# Patient Record
Sex: Female | Born: 1935 | Race: White | Hispanic: No | Smoking: Never smoker
Health system: Southern US, Community
[De-identification: ages and names within clinical notes are randomized; demographics above are authoritative.]

## PROBLEM LIST (undated history)

## (undated) DIAGNOSIS — I10 Essential (primary) hypertension: Secondary | ICD-10-CM

## (undated) DIAGNOSIS — N390 Urinary tract infection, site not specified: Secondary | ICD-10-CM

## (undated) DIAGNOSIS — F32A Depression, unspecified: Secondary | ICD-10-CM

## (undated) DIAGNOSIS — F329 Major depressive disorder, single episode, unspecified: Secondary | ICD-10-CM

## (undated) DIAGNOSIS — F0390 Unspecified dementia without behavioral disturbance: Secondary | ICD-10-CM

## (undated) DIAGNOSIS — I2510 Atherosclerotic heart disease of native coronary artery without angina pectoris: Secondary | ICD-10-CM

## (undated) DIAGNOSIS — F419 Anxiety disorder, unspecified: Secondary | ICD-10-CM

## (undated) DIAGNOSIS — K529 Noninfective gastroenteritis and colitis, unspecified: Secondary | ICD-10-CM

## (undated) DIAGNOSIS — I480 Paroxysmal atrial fibrillation: Secondary | ICD-10-CM

## (undated) DIAGNOSIS — K219 Gastro-esophageal reflux disease without esophagitis: Secondary | ICD-10-CM

## (undated) DIAGNOSIS — J411 Mucopurulent chronic bronchitis: Secondary | ICD-10-CM

## (undated) DIAGNOSIS — S72002A Fracture of unspecified part of neck of left femur, initial encounter for closed fracture: Secondary | ICD-10-CM

## (undated) DIAGNOSIS — J849 Interstitial pulmonary disease, unspecified: Secondary | ICD-10-CM

## (undated) DIAGNOSIS — C50919 Malignant neoplasm of unspecified site of unspecified female breast: Secondary | ICD-10-CM

## (undated) HISTORY — DX: Paroxysmal atrial fibrillation: I48.0

## (undated) HISTORY — DX: Malignant neoplasm of unspecified site of unspecified female breast: C50.919

## (undated) HISTORY — DX: Unspecified dementia, unspecified severity, without behavioral disturbance, psychotic disturbance, mood disturbance, and anxiety: F03.90

## (undated) HISTORY — DX: Fracture of unspecified part of neck of left femur, initial encounter for closed fracture: S72.002A

## (undated) HISTORY — DX: Gastro-esophageal reflux disease without esophagitis: K21.9

## (undated) HISTORY — DX: Atherosclerotic heart disease of native coronary artery without angina pectoris: I25.10

## (undated) HISTORY — PX: CATARACT EXTRACTION: SUR2

---

## 2006-11-14 DIAGNOSIS — C50919 Malignant neoplasm of unspecified site of unspecified female breast: Secondary | ICD-10-CM

## 2006-11-14 HISTORY — PX: MASTECTOMY: SHX3

## 2006-11-14 HISTORY — DX: Malignant neoplasm of unspecified site of unspecified female breast: C50.919

## 2009-11-14 DIAGNOSIS — S72002A Fracture of unspecified part of neck of left femur, initial encounter for closed fracture: Secondary | ICD-10-CM

## 2009-11-14 HISTORY — DX: Fracture of unspecified part of neck of left femur, initial encounter for closed fracture: S72.002A

## 2009-11-14 HISTORY — PX: ORIF HIP FRACTURE: SHX2125

## 2009-12-30 ENCOUNTER — Ambulatory Visit (HOSPITAL_COMMUNITY): Admission: RE | Admit: 2009-12-30 | Discharge: 2009-12-30 | Payer: Self-pay | Admitting: Internal Medicine

## 2010-02-12 ENCOUNTER — Emergency Department (HOSPITAL_COMMUNITY): Admission: EM | Admit: 2010-02-12 | Discharge: 2010-02-12 | Payer: Self-pay | Admitting: Emergency Medicine

## 2010-02-19 ENCOUNTER — Emergency Department (HOSPITAL_COMMUNITY): Admission: EM | Admit: 2010-02-19 | Discharge: 2010-02-20 | Payer: Self-pay | Admitting: Emergency Medicine

## 2010-03-05 ENCOUNTER — Emergency Department (HOSPITAL_COMMUNITY): Admission: EM | Admit: 2010-03-05 | Discharge: 2010-03-05 | Payer: Self-pay | Admitting: Emergency Medicine

## 2010-03-09 ENCOUNTER — Inpatient Hospital Stay (HOSPITAL_COMMUNITY): Admission: EM | Admit: 2010-03-09 | Discharge: 2010-03-12 | Payer: Self-pay | Admitting: Emergency Medicine

## 2010-03-10 ENCOUNTER — Encounter: Payer: Self-pay | Admitting: Cardiology

## 2010-03-10 ENCOUNTER — Ambulatory Visit: Payer: Self-pay | Admitting: Cardiology

## 2010-03-16 ENCOUNTER — Emergency Department (HOSPITAL_COMMUNITY): Admission: EM | Admit: 2010-03-16 | Discharge: 2010-03-16 | Payer: Self-pay | Admitting: Emergency Medicine

## 2010-03-22 ENCOUNTER — Emergency Department (HOSPITAL_COMMUNITY): Admission: EM | Admit: 2010-03-22 | Discharge: 2010-03-22 | Payer: Self-pay | Admitting: Emergency Medicine

## 2010-04-27 ENCOUNTER — Emergency Department (HOSPITAL_COMMUNITY): Admission: EM | Admit: 2010-04-27 | Discharge: 2010-04-27 | Payer: Self-pay | Admitting: Emergency Medicine

## 2010-06-11 ENCOUNTER — Emergency Department (HOSPITAL_COMMUNITY): Admission: EM | Admit: 2010-06-11 | Discharge: 2010-06-11 | Payer: Self-pay | Admitting: Emergency Medicine

## 2010-08-19 ENCOUNTER — Inpatient Hospital Stay (HOSPITAL_COMMUNITY): Admission: EM | Admit: 2010-08-19 | Discharge: 2010-08-20 | Payer: Self-pay | Admitting: Cardiovascular Disease

## 2010-08-19 ENCOUNTER — Encounter: Payer: Self-pay | Admitting: Cardiovascular Disease

## 2010-08-19 ENCOUNTER — Ambulatory Visit: Payer: Self-pay | Admitting: Cardiovascular Disease

## 2010-08-28 ENCOUNTER — Emergency Department (HOSPITAL_COMMUNITY): Admission: EM | Admit: 2010-08-28 | Discharge: 2010-08-29 | Payer: Self-pay | Admitting: Emergency Medicine

## 2010-08-30 ENCOUNTER — Emergency Department (HOSPITAL_COMMUNITY): Admission: EM | Admit: 2010-08-30 | Discharge: 2010-08-30 | Payer: Self-pay | Admitting: Emergency Medicine

## 2010-09-06 ENCOUNTER — Encounter (INDEPENDENT_AMBULATORY_CARE_PROVIDER_SITE_OTHER): Payer: Self-pay | Admitting: *Deleted

## 2010-09-11 ENCOUNTER — Emergency Department (HOSPITAL_COMMUNITY)
Admission: EM | Admit: 2010-09-11 | Discharge: 2010-09-11 | Payer: Self-pay | Source: Home / Self Care | Admitting: Emergency Medicine

## 2010-09-13 ENCOUNTER — Emergency Department (HOSPITAL_COMMUNITY): Admission: EM | Admit: 2010-09-13 | Discharge: 2010-09-13 | Payer: Self-pay | Admitting: Emergency Medicine

## 2010-09-15 ENCOUNTER — Emergency Department (HOSPITAL_COMMUNITY): Admission: EM | Admit: 2010-09-15 | Discharge: 2010-09-15 | Payer: Self-pay | Admitting: Emergency Medicine

## 2010-12-02 ENCOUNTER — Ambulatory Visit (HOSPITAL_COMMUNITY)
Admission: RE | Admit: 2010-12-02 | Discharge: 2010-12-02 | Payer: Self-pay | Source: Home / Self Care | Attending: Internal Medicine | Admitting: Internal Medicine

## 2010-12-14 NOTE — Letter (Signed)
Summary: Appointment - Missed  [facility]  [address], [place] [address]   Phone: [phone]  Fax: [phone]     September 06, 2010 MRN: [id number]   [name] [address] [place], [place]  [address]   Dear Ms. Imbert,  Our records indicate you missed your appointment on      09/06/10 Joni Reining NP                 It is very important that we reach you to reschedule this appointment. We look forward to participating in your health care needs. Please contact us at the number listed above at your earliest convenience to reschedule this appointment.     Sincerely,    Glass blower/designer

## 2011-01-25 LAB — URINALYSIS, ROUTINE W REFLEX MICROSCOPIC
Bilirubin Urine: NEGATIVE
Glucose, UA: NEGATIVE mg/dL
Hgb urine dipstick: NEGATIVE
Protein, ur: NEGATIVE mg/dL
Specific Gravity, Urine: 1.015 (ref 1.005–1.030)
Urobilinogen, UA: 0.2 mg/dL (ref 0.0–1.0)
pH: 6 (ref 5.0–8.0)

## 2011-01-25 LAB — CBC
MCH: 33.5 pg (ref 26.0–34.0)
MCV: 98.7 fL (ref 78.0–100.0)
Platelets: 218 10*3/uL (ref 150–400)
RBC: 3.81 MIL/uL — ABNORMAL LOW (ref 3.87–5.11)
RDW: 14.5 % (ref 11.5–15.5)
WBC: 9.4 10*3/uL (ref 4.0–10.5)

## 2011-01-25 LAB — BASIC METABOLIC PANEL
BUN: 31 mg/dL — ABNORMAL HIGH (ref 6–23)
Calcium: 9.1 mg/dL (ref 8.4–10.5)
Creatinine, Ser: 1.06 mg/dL (ref 0.4–1.2)
GFR calc Af Amer: 59 mL/min — ABNORMAL LOW (ref >60)
Glucose, Bld: 112 mg/dL — ABNORMAL HIGH (ref 70–99)

## 2011-01-25 LAB — POCT CARDIAC MARKERS
CKMB, poc: <1 ng/mL — ABNORMAL LOW (ref 1.0–8.0)
Myoglobin, poc: 76.8 ng/mL (ref 12–200)
Troponin i, poc: <0.05 ng/mL (ref 0.00–0.09)
Troponin i, poc: <0.05 ng/mL (ref 0.00–0.09)

## 2011-01-25 LAB — DIFFERENTIAL
Eosinophils Relative: 1 % (ref 0–5)
Lymphs Abs: 1.7 10*3/uL (ref 0.7–4.0)

## 2011-01-26 LAB — URINALYSIS, ROUTINE W REFLEX MICROSCOPIC
Bilirubin Urine: NEGATIVE
Bilirubin Urine: NEGATIVE
Bilirubin Urine: NEGATIVE
Glucose, UA: NEGATIVE mg/dL
Glucose, UA: NEGATIVE mg/dL
Ketones, ur: NEGATIVE mg/dL
Ketones, ur: NEGATIVE mg/dL
Protein, ur: 30 mg/dL — AB
Specific Gravity, Urine: 1.01 (ref 1.005–1.030)
Urobilinogen, UA: 0.2 mg/dL (ref 0.0–1.0)
pH: 5.5 (ref 5.0–8.0)

## 2011-01-26 LAB — BASIC METABOLIC PANEL
BUN: 20 mg/dL (ref 6–23)
CO2: 28 mEq/L (ref 19–32)
Calcium: 8.8 mg/dL (ref 8.4–10.5)
Chloride: 104 mEq/L (ref 96–112)
GFR calc Af Amer: >60 mL/min (ref >60)
GFR calc non Af Amer: >60 mL/min (ref >60)
Glucose, Bld: 108 mg/dL — ABNORMAL HIGH (ref 70–99)
Potassium: 3.9 mEq/L (ref 3.5–5.1)
Sodium: 141 mEq/L (ref 135–145)
Sodium: 141 mEq/L (ref 135–145)

## 2011-01-26 LAB — POCT I-STAT, CHEM 8
Calcium, Ion: 1.12 mmol/L (ref 1.12–1.32)
Creatinine, Ser: 0.9 mg/dL (ref 0.4–1.2)
Glucose, Bld: 100 mg/dL — ABNORMAL HIGH (ref 70–99)
HCT: 47 % — ABNORMAL HIGH (ref 36.0–46.0)
Hemoglobin: 16 g/dL — ABNORMAL HIGH (ref 12.0–15.0)
Potassium: 3.7 mEq/L (ref 3.5–5.1)

## 2011-01-26 LAB — DIFFERENTIAL
Basophils Absolute: 0 10*3/uL (ref 0.0–0.1)
Basophils Relative: 0 % (ref 0–1)
Eosinophils Absolute: 0.1 10*3/uL (ref 0.0–0.7)
Eosinophils Absolute: 0.2 10*3/uL (ref 0.0–0.7)
Eosinophils Relative: 2 % (ref 0–5)
Lymphs Abs: 1.9 10*3/uL (ref 0.7–4.0)
Monocytes Absolute: 0.7 10*3/uL (ref 0.1–1.0)
Monocytes Relative: 7 % (ref 3–12)
Monocytes Relative: 7 % (ref 3–12)
Neutro Abs: 6.8 10*3/uL (ref 1.7–7.7)
Neutrophils Relative %: 66 % (ref 43–77)

## 2011-01-26 LAB — POCT CARDIAC MARKERS
CKMB, poc: 1.2 ng/mL (ref 1.0–8.0)
CKMB, poc: <1 ng/mL — ABNORMAL LOW (ref 1.0–8.0)
CKMB, poc: <1 ng/mL — ABNORMAL LOW (ref 1.0–8.0)
Myoglobin, poc: 47.4 ng/mL (ref 12–200)
Myoglobin, poc: 55.5 ng/mL (ref 12–200)
Myoglobin, poc: 74.5 ng/mL (ref 12–200)
Myoglobin, poc: 91.8 ng/mL (ref 12–200)
Troponin i, poc: <0.05 ng/mL (ref 0.00–0.09)

## 2011-01-26 LAB — COMPREHENSIVE METABOLIC PANEL
ALT: 19 U/L (ref 0–35)
Albumin: 3.4 g/dL — ABNORMAL LOW (ref 3.5–5.2)
CO2: 25 mEq/L (ref 19–32)
Creatinine, Ser: 1.13 mg/dL (ref 0.4–1.2)
GFR calc non Af Amer: 45 mL/min — ABNORMAL LOW (ref >60)
Total Bilirubin: 0.6 mg/dL (ref 0.3–1.2)
Total Protein: 5.8 g/dL — ABNORMAL LOW (ref 6.0–8.3)

## 2011-01-26 LAB — CBC
HCT: 42.1 % (ref 36.0–46.0)
HCT: 45.9 % (ref 36.0–46.0)
Hemoglobin: 14.3 g/dL (ref 12.0–15.0)
Hemoglobin: 15.4 g/dL — ABNORMAL HIGH (ref 12.0–15.0)
MCH: 33.4 pg (ref 26.0–34.0)
MCHC: 33.6 g/dL (ref 30.0–36.0)
MCHC: 33.8 g/dL (ref 30.0–36.0)
MCV: 99 fL (ref 78.0–100.0)
MCV: 99.4 fL (ref 78.0–100.0)
Platelets: 207 10*3/uL (ref 150–400)
RBC: 4.62 MIL/uL (ref 3.87–5.11)
WBC: 10.4 10*3/uL (ref 4.0–10.5)
WBC: 8.8 10*3/uL (ref 4.0–10.5)

## 2011-01-26 LAB — URINE MICROSCOPIC-ADD ON

## 2011-01-26 LAB — URINE CULTURE: Colony Count: 90000

## 2011-01-26 LAB — PROTIME-INR: INR: 1.06 (ref 0.00–1.49)

## 2011-01-26 LAB — APTT: aPTT: 28 seconds (ref 24–37)

## 2011-01-27 LAB — CBC
HCT: 39.7 % (ref 36.0–46.0)
MCV: 94.5 fL (ref 78.0–100.0)
RDW: 14.4 % (ref 11.5–15.5)
WBC: 9.6 10*3/uL (ref 4.0–10.5)

## 2011-01-27 LAB — DIFFERENTIAL
Lymphocytes Relative: 26 % (ref 12–46)
Lymphs Abs: 2.5 10*3/uL (ref 0.7–4.0)
Neutrophils Relative %: 61 % (ref 43–77)

## 2011-01-27 LAB — LIPID PANEL
Cholesterol: 123 mg/dL (ref 0–200)
LDL Cholesterol: 63 mg/dL (ref 0–99)
Total CHOL/HDL Ratio: 2.5 RATIO
Triglycerides: 51 mg/dL (ref <150)

## 2011-01-27 LAB — BASIC METABOLIC PANEL
BUN: 19 mg/dL (ref 6–23)
CO2: 24 mEq/L (ref 19–32)
Chloride: 109 mEq/L (ref 96–112)
GFR calc Af Amer: >60 mL/min (ref >60)
GFR calc non Af Amer: 54 mL/min — ABNORMAL LOW (ref >60)
Potassium: 3.4 mEq/L — ABNORMAL LOW (ref 3.5–5.1)
Potassium: 3.7 mEq/L (ref 3.5–5.1)
Sodium: 140 mEq/L (ref 135–145)

## 2011-01-27 LAB — POCT CARDIAC MARKERS

## 2011-01-27 LAB — BRAIN NATRIURETIC PEPTIDE: Pro B Natriuretic peptide (BNP): 48 pg/mL (ref 0.0–100.0)

## 2011-01-27 LAB — CK TOTAL AND CKMB (NOT AT ARMC): CK, MB: 2.9 ng/mL (ref 0.3–4.0)

## 2011-01-27 LAB — MRSA PCR SCREENING: MRSA by PCR: POSITIVE — AB

## 2011-01-27 LAB — CARDIAC PANEL(CRET KIN+CKTOT+MB+TROPI)
CK, MB: 2.6 ng/mL (ref 0.3–4.0)
CK, MB: 2.9 ng/mL (ref 0.3–4.0)
Total CK: 41 U/L (ref 7–177)
Troponin I: 0.02 ng/mL (ref 0.00–0.06)

## 2011-01-27 LAB — URINALYSIS, ROUTINE W REFLEX MICROSCOPIC
Bilirubin Urine: NEGATIVE
Glucose, UA: NEGATIVE mg/dL
Hgb urine dipstick: NEGATIVE
Ketones, ur: NEGATIVE mg/dL
Protein, ur: NEGATIVE mg/dL

## 2011-01-27 LAB — PROTIME-INR
INR: 1.06 (ref 0.00–1.49)
Prothrombin Time: 14 seconds (ref 11.6–15.2)

## 2011-01-27 LAB — TROPONIN I: Troponin I: 0.02 ng/mL (ref 0.00–0.06)

## 2011-01-29 LAB — CBC
HCT: 43.7 % (ref 36.0–46.0)
Hemoglobin: 14.8 g/dL (ref 12.0–15.0)
MCH: 30.8 pg (ref 26.0–34.0)
MCHC: 33.8 g/dL (ref 30.0–36.0)
RDW: 16.9 % — ABNORMAL HIGH (ref 11.5–15.5)

## 2011-01-29 LAB — COMPREHENSIVE METABOLIC PANEL
CO2: 30 mEq/L (ref 19–32)
Calcium: 8.8 mg/dL (ref 8.4–10.5)
Creatinine, Ser: 0.73 mg/dL (ref 0.4–1.2)
GFR calc Af Amer: >60 mL/min (ref >60)
GFR calc non Af Amer: >60 mL/min (ref >60)
Glucose, Bld: 78 mg/dL (ref 70–99)

## 2011-01-29 LAB — DIFFERENTIAL
Lymphocytes Relative: 21 % (ref 12–46)
Lymphs Abs: 2 10*3/uL (ref 0.7–4.0)
Neutrophils Relative %: 67 % (ref 43–77)

## 2011-01-29 LAB — POCT CARDIAC MARKERS
CKMB, poc: <1 ng/mL — ABNORMAL LOW (ref 1.0–8.0)
Myoglobin, poc: 35.3 ng/mL (ref 12–200)
Troponin i, poc: <0.05 ng/mL (ref 0.00–0.09)

## 2011-01-31 LAB — POCT CARDIAC MARKERS
CKMB, poc: <1 ng/mL — ABNORMAL LOW (ref 1.0–8.0)
Myoglobin, poc: 40.5 ng/mL (ref 12–200)

## 2011-01-31 LAB — BASIC METABOLIC PANEL
CO2: 26 mEq/L (ref 19–32)
Calcium: 8.7 mg/dL (ref 8.4–10.5)
Creatinine, Ser: 0.83 mg/dL (ref 0.4–1.2)
GFR calc Af Amer: >60 mL/min (ref >60)

## 2011-01-31 LAB — DIFFERENTIAL
Basophils Relative: 1 % (ref 0–1)
Monocytes Relative: 7 % (ref 3–12)
Neutro Abs: 5.9 10*3/uL (ref 1.7–7.7)
Neutrophils Relative %: 67 % (ref 43–77)

## 2011-01-31 LAB — CBC
MCHC: 33.4 g/dL (ref 30.0–36.0)
RBC: 4.22 MIL/uL (ref 3.87–5.11)
WBC: 8.8 10*3/uL (ref 4.0–10.5)

## 2011-02-01 LAB — POCT CARDIAC MARKERS
CKMB, poc: <1 ng/mL — ABNORMAL LOW (ref 1.0–8.0)
Myoglobin, poc: 64.4 ng/mL (ref 12–200)
Troponin i, poc: <0.05 ng/mL (ref 0.00–0.09)

## 2011-02-01 LAB — LIPID PANEL
Cholesterol: 154 mg/dL (ref 0–200)
LDL Cholesterol: 84 mg/dL (ref 0–99)
Total CHOL/HDL Ratio: 3.3 RATIO
Triglycerides: 119 mg/dL (ref <150)
VLDL: 24 mg/dL (ref 0–40)

## 2011-02-01 LAB — BASIC METABOLIC PANEL
BUN: 9 mg/dL (ref 6–23)
CO2: 28 mEq/L (ref 19–32)
Chloride: 106 mEq/L (ref 96–112)
GFR calc Af Amer: >60 mL/min (ref >60)
GFR calc non Af Amer: >60 mL/min (ref >60)
GFR calc non Af Amer: >60 mL/min (ref >60)
Glucose, Bld: 101 mg/dL — ABNORMAL HIGH (ref 70–99)
Potassium: 3.2 mEq/L — ABNORMAL LOW (ref 3.5–5.1)
Potassium: 4.1 mEq/L (ref 3.5–5.1)
Sodium: 138 mEq/L (ref 135–145)
Sodium: 139 mEq/L (ref 135–145)

## 2011-02-01 LAB — DIFFERENTIAL
Basophils Absolute: 0.1 10*3/uL (ref 0.0–0.1)
Basophils Relative: 1 % (ref 0–1)
Monocytes Relative: 6 % (ref 3–12)
Neutro Abs: 6.7 10*3/uL (ref 1.7–7.7)
Neutrophils Relative %: 75 % (ref 43–77)

## 2011-02-01 LAB — CARDIAC PANEL(CRET KIN+CKTOT+MB+TROPI)
Relative Index: INVALID (ref 0.0–2.5)
Total CK: 23 U/L (ref 7–177)
Total CK: 23 U/L (ref 7–177)

## 2011-02-01 LAB — COMPREHENSIVE METABOLIC PANEL
Alkaline Phosphatase: 75 U/L (ref 39–117)
BUN: 13 mg/dL (ref 6–23)
CO2: 26 mEq/L (ref 19–32)
GFR calc non Af Amer: >60 mL/min (ref >60)
Glucose, Bld: 112 mg/dL — ABNORMAL HIGH (ref 70–99)
Potassium: 3.8 mEq/L (ref 3.5–5.1)
Total Protein: 5.5 g/dL — ABNORMAL LOW (ref 6.0–8.3)

## 2011-02-01 LAB — TROPONIN I: Troponin I: 0.02 ng/mL (ref 0.00–0.06)

## 2011-02-01 LAB — TSH: TSH: 2.937 u[IU]/mL (ref 0.350–4.500)

## 2011-02-01 LAB — CBC
MCHC: 34.5 g/dL (ref 30.0–36.0)
Platelets: 321 10*3/uL (ref 150–400)
RBC: 4.18 MIL/uL (ref 3.87–5.11)
RDW: 17.4 % — ABNORMAL HIGH (ref 11.5–15.5)

## 2011-02-01 LAB — CK TOTAL AND CKMB (NOT AT ARMC)
CK, MB: 2.2 ng/mL (ref 0.3–4.0)
Total CK: 27 U/L (ref 7–177)

## 2011-02-01 LAB — RPR: RPR Ser Ql: NONREACTIVE

## 2011-02-02 LAB — CBC
HCT: 39.9 % (ref 36.0–46.0)
Platelets: 469 10*3/uL — ABNORMAL HIGH (ref 150–400)
RDW: 14.8 % (ref 11.5–15.5)

## 2011-02-02 LAB — BASIC METABOLIC PANEL
BUN: 8 mg/dL (ref 6–23)
Calcium: 8.8 mg/dL (ref 8.4–10.5)
Creatinine, Ser: 0.7 mg/dL (ref 0.4–1.2)
GFR calc non Af Amer: >60 mL/min (ref >60)
Glucose, Bld: 98 mg/dL (ref 70–99)

## 2011-02-02 LAB — URINALYSIS, ROUTINE W REFLEX MICROSCOPIC
Bilirubin Urine: NEGATIVE
Glucose, UA: NEGATIVE mg/dL
Hgb urine dipstick: NEGATIVE
Ketones, ur: NEGATIVE mg/dL
Specific Gravity, Urine: 1.025 (ref 1.005–1.030)
pH: 6 (ref 5.0–8.0)

## 2011-02-02 LAB — DIFFERENTIAL
Basophils Absolute: 0.1 10*3/uL (ref 0.0–0.1)
Eosinophils Relative: 7 % — ABNORMAL HIGH (ref 0–5)
Lymphocytes Relative: 14 % (ref 12–46)
Neutro Abs: 8.1 10*3/uL — ABNORMAL HIGH (ref 1.7–7.7)
Neutrophils Relative %: 72 % (ref 43–77)

## 2011-02-19 ENCOUNTER — Emergency Department (HOSPITAL_COMMUNITY)
Admission: EM | Admit: 2011-02-19 | Discharge: 2011-02-19 | Disposition: A | Discharge status: Home / Self Care | Payer: Medicare Other | Attending: Emergency Medicine | Admitting: Emergency Medicine

## 2011-02-19 DIAGNOSIS — I10 Essential (primary) hypertension: Secondary | ICD-10-CM | POA: Insufficient documentation

## 2011-02-19 DIAGNOSIS — I2510 Atherosclerotic heart disease of native coronary artery without angina pectoris: Secondary | ICD-10-CM | POA: Insufficient documentation

## 2011-02-19 DIAGNOSIS — Z79899 Other long term (current) drug therapy: Secondary | ICD-10-CM | POA: Insufficient documentation

## 2011-02-19 DIAGNOSIS — F411 Generalized anxiety disorder: Secondary | ICD-10-CM | POA: Insufficient documentation

## 2011-02-19 DIAGNOSIS — K219 Gastro-esophageal reflux disease without esophagitis: Secondary | ICD-10-CM | POA: Insufficient documentation

## 2011-02-19 DIAGNOSIS — R51 Headache: Secondary | ICD-10-CM | POA: Insufficient documentation

## 2011-02-19 DIAGNOSIS — I4891 Unspecified atrial fibrillation: Secondary | ICD-10-CM | POA: Insufficient documentation

## 2011-02-19 DIAGNOSIS — F0390 Unspecified dementia without behavioral disturbance: Secondary | ICD-10-CM | POA: Insufficient documentation

## 2011-02-19 DIAGNOSIS — IMO0001 Reserved for inherently not codable concepts without codable children: Secondary | ICD-10-CM | POA: Insufficient documentation

## 2011-02-19 DIAGNOSIS — Z8639 Personal history of other endocrine, nutritional and metabolic disease: Secondary | ICD-10-CM | POA: Insufficient documentation

## 2011-02-19 DIAGNOSIS — I252 Old myocardial infarction: Secondary | ICD-10-CM | POA: Insufficient documentation

## 2011-02-19 DIAGNOSIS — Z862 Personal history of diseases of the blood and blood-forming organs and certain disorders involving the immune mechanism: Secondary | ICD-10-CM | POA: Insufficient documentation

## 2011-02-19 LAB — BASIC METABOLIC PANEL
BUN: 25 mg/dL — ABNORMAL HIGH (ref 6–23)
Chloride: 107 mEq/L (ref 96–112)
GFR calc Af Amer: >60 mL/min (ref >60)
Potassium: 4.1 mEq/L (ref 3.5–5.1)

## 2011-02-19 LAB — DIFFERENTIAL
Eosinophils Absolute: 0.1 10*3/uL (ref 0.0–0.7)
Lymphs Abs: 0.7 10*3/uL (ref 0.7–4.0)
Neutrophils Relative %: 79 % — ABNORMAL HIGH (ref 43–77)

## 2011-02-19 LAB — URINALYSIS, ROUTINE W REFLEX MICROSCOPIC
Glucose, UA: NEGATIVE mg/dL
Specific Gravity, Urine: 1.025 (ref 1.005–1.030)
pH: 6 (ref 5.0–8.0)

## 2011-02-19 LAB — CBC
MCV: 95.8 fL (ref 78.0–100.0)
Platelets: 168 10*3/uL (ref 150–400)
RBC: 4.8 MIL/uL (ref 3.87–5.11)
WBC: 7.9 10*3/uL (ref 4.0–10.5)

## 2011-02-21 ENCOUNTER — Emergency Department (HOSPITAL_COMMUNITY)
Admission: EM | Admit: 2011-02-21 | Discharge: 2011-02-21 | Disposition: A | Discharge status: Home / Self Care | Payer: Medicare Other | Attending: Emergency Medicine | Admitting: Emergency Medicine

## 2011-02-21 ENCOUNTER — Emergency Department (HOSPITAL_COMMUNITY): Payer: Medicare Other

## 2011-02-21 ENCOUNTER — Encounter (HOSPITAL_COMMUNITY): Payer: Self-pay

## 2011-02-21 DIAGNOSIS — I10 Essential (primary) hypertension: Secondary | ICD-10-CM | POA: Insufficient documentation

## 2011-02-21 DIAGNOSIS — F0280 Dementia in other diseases classified elsewhere without behavioral disturbance: Secondary | ICD-10-CM | POA: Insufficient documentation

## 2011-02-21 DIAGNOSIS — I4891 Unspecified atrial fibrillation: Secondary | ICD-10-CM | POA: Insufficient documentation

## 2011-02-21 DIAGNOSIS — M549 Dorsalgia, unspecified: Secondary | ICD-10-CM | POA: Insufficient documentation

## 2011-02-21 DIAGNOSIS — K219 Gastro-esophageal reflux disease without esophagitis: Secondary | ICD-10-CM | POA: Insufficient documentation

## 2011-02-21 DIAGNOSIS — R011 Cardiac murmur, unspecified: Secondary | ICD-10-CM | POA: Insufficient documentation

## 2011-02-21 DIAGNOSIS — G309 Alzheimer's disease, unspecified: Secondary | ICD-10-CM | POA: Insufficient documentation

## 2011-02-21 DIAGNOSIS — R062 Wheezing: Secondary | ICD-10-CM | POA: Insufficient documentation

## 2011-02-21 DIAGNOSIS — M4000 Postural kyphosis, site unspecified: Secondary | ICD-10-CM | POA: Insufficient documentation

## 2011-02-21 DIAGNOSIS — Z7982 Long term (current) use of aspirin: Secondary | ICD-10-CM | POA: Insufficient documentation

## 2011-02-21 DIAGNOSIS — E860 Dehydration: Secondary | ICD-10-CM | POA: Insufficient documentation

## 2011-02-21 DIAGNOSIS — R0602 Shortness of breath: Secondary | ICD-10-CM | POA: Insufficient documentation

## 2011-02-21 DIAGNOSIS — I2510 Atherosclerotic heart disease of native coronary artery without angina pectoris: Secondary | ICD-10-CM | POA: Insufficient documentation

## 2011-02-21 DIAGNOSIS — Z79899 Other long term (current) drug therapy: Secondary | ICD-10-CM | POA: Insufficient documentation

## 2011-02-21 LAB — URINALYSIS, ROUTINE W REFLEX MICROSCOPIC
Glucose, UA: NEGATIVE mg/dL
Hgb urine dipstick: NEGATIVE
Specific Gravity, Urine: 1.025 (ref 1.005–1.030)
Urobilinogen, UA: 0.2 mg/dL (ref 0.0–1.0)

## 2011-02-21 LAB — CBC
HCT: 38.7 % (ref 36.0–46.0)
Hemoglobin: 13.4 g/dL (ref 12.0–15.0)
RDW: 14.9 % (ref 11.5–15.5)
WBC: 7.3 10*3/uL (ref 4.0–10.5)

## 2011-02-21 LAB — POCT CARDIAC MARKERS: Troponin i, poc: <0.05 ng/mL (ref 0.00–0.09)

## 2011-02-21 LAB — D-DIMER, QUANTITATIVE: D-Dimer, Quant: 1.84 ug/mL-FEU — ABNORMAL HIGH (ref 0.00–0.48)

## 2011-02-21 LAB — DIFFERENTIAL
Eosinophils Relative: 1 % (ref 0–5)
Lymphocytes Relative: 15 % (ref 12–46)
Monocytes Relative: 15 % — ABNORMAL HIGH (ref 3–12)
Neutrophils Relative %: 69 % (ref 43–77)

## 2011-02-21 LAB — BASIC METABOLIC PANEL
CO2: 23 mEq/L (ref 19–32)
Calcium: 8.2 mg/dL — ABNORMAL LOW (ref 8.4–10.5)
GFR calc Af Amer: 36 mL/min — ABNORMAL LOW (ref >60)
Sodium: 132 mEq/L — ABNORMAL LOW (ref 135–145)

## 2011-02-21 LAB — BRAIN NATRIURETIC PEPTIDE: Pro B Natriuretic peptide (BNP): 143 pg/mL — ABNORMAL HIGH (ref 0.0–100.0)

## 2011-02-21 MED ORDER — TECHNETIUM TO 99M ALBUMIN AGGREGATED
5.0000 | Freq: Once | INTRAVENOUS | Status: AC | PRN
  Administered 2011-02-21: 5 via INTRAVENOUS

## 2011-02-21 MED ORDER — XENON XE 133 GAS
10.0000 | GAS_FOR_INHALATION | Freq: Once | RESPIRATORY_TRACT | Status: AC | PRN
  Administered 2011-02-21: 10.3 via RESPIRATORY_TRACT

## 2011-02-22 LAB — URINE CULTURE
Colony Count: NO GROWTH
Culture: NO GROWTH

## 2011-02-25 ENCOUNTER — Inpatient Hospital Stay (HOSPITAL_COMMUNITY)
Admission: RE | Admit: 2011-02-25 | Discharge: 2011-03-03 | DRG: 195 | Disposition: A | Discharge status: Nursing Home (Includes ICF) | Payer: Medicare Other | Source: Ambulatory Visit | Attending: Internal Medicine | Admitting: Internal Medicine

## 2011-02-25 ENCOUNTER — Emergency Department (HOSPITAL_COMMUNITY)
Admission: EM | Admit: 2011-02-25 | Discharge: 2011-02-25 | Disposition: A | Discharge status: Home / Self Care | Payer: Medicare Other | Attending: Emergency Medicine | Admitting: Emergency Medicine

## 2011-02-25 ENCOUNTER — Emergency Department (HOSPITAL_COMMUNITY): Payer: Medicare Other

## 2011-02-25 ENCOUNTER — Inpatient Hospital Stay (HOSPITAL_COMMUNITY): Payer: Medicare Other

## 2011-02-25 DIAGNOSIS — I10 Essential (primary) hypertension: Secondary | ICD-10-CM | POA: Insufficient documentation

## 2011-02-25 DIAGNOSIS — I4891 Unspecified atrial fibrillation: Secondary | ICD-10-CM | POA: Diagnosis present

## 2011-02-25 DIAGNOSIS — G309 Alzheimer's disease, unspecified: Secondary | ICD-10-CM | POA: Insufficient documentation

## 2011-02-25 DIAGNOSIS — F411 Generalized anxiety disorder: Secondary | ICD-10-CM | POA: Insufficient documentation

## 2011-02-25 DIAGNOSIS — F341 Dysthymic disorder: Secondary | ICD-10-CM | POA: Diagnosis present

## 2011-02-25 DIAGNOSIS — M1990 Unspecified osteoarthritis, unspecified site: Secondary | ICD-10-CM | POA: Diagnosis present

## 2011-02-25 DIAGNOSIS — W010XXA Fall on same level from slipping, tripping and stumbling without subsequent striking against object, initial encounter: Secondary | ICD-10-CM | POA: Insufficient documentation

## 2011-02-25 DIAGNOSIS — K219 Gastro-esophageal reflux disease without esophagitis: Secondary | ICD-10-CM | POA: Diagnosis present

## 2011-02-25 DIAGNOSIS — I2510 Atherosclerotic heart disease of native coronary artery without angina pectoris: Secondary | ICD-10-CM | POA: Diagnosis present

## 2011-02-25 DIAGNOSIS — G894 Chronic pain syndrome: Secondary | ICD-10-CM | POA: Diagnosis present

## 2011-02-25 DIAGNOSIS — F0390 Unspecified dementia without behavioral disturbance: Secondary | ICD-10-CM | POA: Diagnosis present

## 2011-02-25 DIAGNOSIS — F0280 Dementia in other diseases classified elsewhere without behavioral disturbance: Secondary | ICD-10-CM | POA: Insufficient documentation

## 2011-02-25 DIAGNOSIS — Z79899 Other long term (current) drug therapy: Secondary | ICD-10-CM | POA: Insufficient documentation

## 2011-02-25 DIAGNOSIS — R05 Cough: Secondary | ICD-10-CM | POA: Insufficient documentation

## 2011-02-25 DIAGNOSIS — S5010XA Contusion of unspecified forearm, initial encounter: Secondary | ICD-10-CM | POA: Insufficient documentation

## 2011-02-25 DIAGNOSIS — R059 Cough, unspecified: Secondary | ICD-10-CM | POA: Insufficient documentation

## 2011-02-25 DIAGNOSIS — J189 Pneumonia, unspecified organism: Principal | ICD-10-CM | POA: Diagnosis present

## 2011-02-25 DIAGNOSIS — S8010XA Contusion of unspecified lower leg, initial encounter: Secondary | ICD-10-CM | POA: Insufficient documentation

## 2011-02-25 DIAGNOSIS — J40 Bronchitis, not specified as acute or chronic: Secondary | ICD-10-CM | POA: Diagnosis present

## 2011-02-25 DIAGNOSIS — Y9210 Unspecified residential institution as the place of occurrence of the external cause: Secondary | ICD-10-CM | POA: Insufficient documentation

## 2011-02-25 DIAGNOSIS — J8410 Pulmonary fibrosis, unspecified: Secondary | ICD-10-CM | POA: Diagnosis present

## 2011-02-25 LAB — BLOOD GAS, ARTERIAL
Bicarbonate: 21.7 mEq/L (ref 20.0–24.0)
O2 Content: 2 L/min
O2 Saturation: 97.3 %
TCO2: 19.4 mmol/L (ref 0–100)
pO2, Arterial: 94.7 mmHg (ref 80.0–100.0)

## 2011-02-25 LAB — CBC
HCT: 39.7 % (ref 36.0–46.0)
HCT: 41.6 % (ref 36.0–46.0)
Hemoglobin: 13.6 g/dL (ref 12.0–15.0)
Hemoglobin: 13.8 g/dL (ref 12.0–15.0)
MCV: 96.5 fL (ref 78.0–100.0)
RBC: 4.25 MIL/uL (ref 3.87–5.11)
WBC: 13.3 10*3/uL — ABNORMAL HIGH (ref 4.0–10.5)
WBC: 15.8 10*3/uL — ABNORMAL HIGH (ref 4.0–10.5)

## 2011-02-25 LAB — BASIC METABOLIC PANEL
CO2: 21 mEq/L (ref 19–32)
Calcium: 8.3 mg/dL — ABNORMAL LOW (ref 8.4–10.5)
Creatinine, Ser: 0.99 mg/dL (ref 0.4–1.2)
GFR calc Af Amer: >60 mL/min (ref >60)
GFR calc non Af Amer: 53 mL/min — ABNORMAL LOW (ref >60)
Sodium: 136 mEq/L (ref 135–145)

## 2011-02-25 LAB — URINALYSIS, ROUTINE W REFLEX MICROSCOPIC
Ketones, ur: NEGATIVE mg/dL
Nitrite: NEGATIVE
Protein, ur: NEGATIVE mg/dL
Urobilinogen, UA: 0.2 mg/dL (ref 0.0–1.0)

## 2011-02-25 LAB — DIFFERENTIAL
Basophils Absolute: 0 10*3/uL (ref 0.0–0.1)
Basophils Absolute: 0 10*3/uL (ref 0.0–0.1)
Basophils Relative: 0 % (ref 0–1)
Lymphocytes Relative: 8 % — ABNORMAL LOW (ref 12–46)
Lymphocytes Relative: 11 % — ABNORMAL LOW (ref 12–46)
Lymphs Abs: 1.7 10*3/uL (ref 0.7–4.0)
Monocytes Absolute: 0.8 10*3/uL (ref 0.1–1.0)
Neutro Abs: 10.6 10*3/uL — ABNORMAL HIGH (ref 1.7–7.7)
Neutro Abs: 13.2 10*3/uL — ABNORMAL HIGH (ref 1.7–7.7)
Neutrophils Relative %: 80 % — ABNORMAL HIGH (ref 43–77)

## 2011-02-26 LAB — COMPREHENSIVE METABOLIC PANEL
ALT: 13 U/L (ref 0–35)
Alkaline Phosphatase: 48 U/L (ref 39–117)
CO2: 22 mEq/L (ref 19–32)
Chloride: 105 mEq/L (ref 96–112)
GFR calc Af Amer: 44 mL/min — ABNORMAL LOW (ref >60)
GFR calc non Af Amer: 36 mL/min — ABNORMAL LOW (ref >60)
Glucose, Bld: 290 mg/dL — ABNORMAL HIGH (ref 70–99)
Total Bilirubin: 0.5 mg/dL (ref 0.3–1.2)

## 2011-02-27 ENCOUNTER — Inpatient Hospital Stay (HOSPITAL_COMMUNITY): Payer: Medicare Other

## 2011-02-27 LAB — CBC
Hemoglobin: 12.8 g/dL (ref 12.0–15.0)
MCH: 31.4 pg (ref 26.0–34.0)
MCHC: 33.2 g/dL (ref 30.0–36.0)

## 2011-02-27 LAB — DIFFERENTIAL
Basophils Absolute: 0 10*3/uL (ref 0.0–0.1)
Eosinophils Absolute: 0.1 10*3/uL (ref 0.0–0.7)
Lymphs Abs: 1.6 10*3/uL (ref 0.7–4.0)
Monocytes Relative: 7 % (ref 3–12)
Neutro Abs: 8.8 10*3/uL — ABNORMAL HIGH (ref 1.7–7.7)

## 2011-02-27 LAB — BASIC METABOLIC PANEL
CO2: 23 mEq/L (ref 19–32)
Calcium: 8.3 mg/dL — ABNORMAL LOW (ref 8.4–10.5)
Creatinine, Ser: 0.8 mg/dL (ref 0.4–1.2)
GFR calc Af Amer: >60 mL/min (ref >60)

## 2011-02-28 LAB — INFLUENZA PANEL BY PCR (TYPE A & B): Influenza A By PCR: NEGATIVE

## 2011-03-02 LAB — CBC
HCT: 39.4 % (ref 36.0–46.0)
Hemoglobin: 13 g/dL (ref 12.0–15.0)
MCH: 31.6 pg (ref 26.0–34.0)
MCHC: 33 g/dL (ref 30.0–36.0)
MCV: 95.6 fL (ref 78.0–100.0)

## 2011-03-02 LAB — BASIC METABOLIC PANEL
BUN: 12 mg/dL (ref 6–23)
CO2: 25 mEq/L (ref 19–32)
Calcium: 8.6 mg/dL (ref 8.4–10.5)
Creatinine, Ser: 0.91 mg/dL (ref 0.4–1.2)
Glucose, Bld: 142 mg/dL — ABNORMAL HIGH (ref 70–99)

## 2011-03-09 NOTE — Discharge Summary (Signed)
  NAMEMEGA, KINKADE           ACCOUNT NO.:  [id number]  MEDICAL RECORD NO.:  [id number]           PATIENT TYPE:  I  LOCATION:  A334                          FACILITY:  [facility]  PHYSICIAN:  Sheniqua Carolan D. Felecia Shelling, MD   DATE OF BIRTH:  Apr 10, 1917  DATE OF ADMISSION:  02/25/2011 DATE OF DISCHARGE:  04/19/2012LH                              DISCHARGE SUMMARY   DISCHARGE DIAGNOSES: 1. Interstitial pulmonary disease. 2. Probably superimposed bronchitis. 3. Dementia. 4. Atrial fibrillation. 5. Hypertension. 6. Anxiety/depression disorder. 7. History of left hip fracture and status post open reduction and     internal fixation. 8. Chronic pain syndrome. 9. Gastroesophageal reflux disease. 10.Coronary artery disease.  DISCHARGE MEDICATIONS: 1. Augmentin 500 mg p.o. b.i.d. for 5 days. 2. Acetaminophen 500 mg q.6 p.r.n. 3. Aspirin 81 mg daily. 4. Calcium carbonate with vitamin D 1 tablet daily. 5. Fentanyl patch 25 mcg q.h. change q.72 h. 6. Ferrous sulfate 325 mg b.i.d. 7. Imdur 30 mg p.o. daily. 8. Imodium p.r.n. 9. Lasix 20 mg daily. 10.Loratadine 5/20 one tablet p.o. daily. 11.Megace 400 mg daily. 12.Multivitamin 1 tablet daily.  DISPOSITION:  The patient will be transferred to [facility].  HOSPITAL COURSE:  This is a 77 year old female patient with history of multiple medical illnesses who was admitted due to recurrent cough and congestion.  Her chest x-ray showed atelectasis and interstitial pulmonary disease.  The patient was empirically treated with IV antibiotics and nebulizer treatments.  Her PCR for influenza was negative.  Over the hospital stay, the patient gradually improved. She is going to be discharged back to a nursing home to continue her regular medications.  DISCHARGE INSTRUCTIONS:  The patient will be followed in the office and will continue current treatment.     Martrice Apt D. Felecia Shelling, MD     TDF/MEDQ  D:  03/03/2011  T:  03/03/2011  Job:   [id number]  Electronically Signed by Avon Gully MD on 03/09/2011 08:33:49 AM

## 2011-03-09 NOTE — H&P (Signed)
NAMEADANYA, [name]           ACCOUNT NO.:  [id number]  MEDICAL RECORD NO.:  [id number]           PATIENT TYPE:  I  LOCATION:  A334                          FACILITY:  [facility]  PHYSICIAN:  Linea Calles D. Felecia Shelling, MD   DATE OF BIRTH:  01-22-1917  DATE OF ADMISSION:  02/25/2011 DATE OF DISCHARGE:  LH                             HISTORY & PHYSICAL   CHIEF COMPLAINT:  Cough, shortness of breath, and congestion.  HISTORY OF PRESENT ILLNESS:  This is a 77 year old female patient who is currently a resident of [address], who was brought to my office due to the above complaint.  The patient went to emergency room recently twice for fall and cough and congestion.  She was evaluated and continued on her regular medications.  However, the patient started having worsening shortness of breath and congestion.  She is unable to take her oral feeding or medications.  Her condition is deteriorating. She is very weak and lethargic.  She was seen in the office and was admitted as a possible case of pneumonia to start her on IV antibiotics.  REVIEW OF SYSTEMS:  The patient feels very weak and she has no appetite. The patient is coughing recurrently and has a whitish sputum.  No fever, chills, chest pain, nausea, vomiting, abdominal pain, dysuria, urgency or frequency of urination.  PAST MEDICAL HISTORY: 1. Atrial fibrillation. 2. Hypertension. 3. Dementia. 4. Anxiety/depression disorder. 5. Chronic constipation. 6. History of left hip fracture and status post open reduction and     internal fixation. 7. Gastroesophageal reflux disease. 8. Coronary artery disease.  CURRENT MEDICATIONS: 1. Duragesic patch 25 mcg daily. 2. Senna 2 tablets daily. 3. Imdur 30 mg daily. 4. Protonix 40 mg daily. 5. KCl 20 mEq daily. 6. Simvastatin 40 mg daily. 7. Zinc sulfate 20 mg daily. 8. Megace 400 mg b.i.d. 9. Ferrous sulfate 325 mg daily. 10.Acetaminophen 500 mg q.6 h. 11.Xanax 0.25 mg daily  p.r.n. 12.Vicodin 5/500 q.6 p.r.n. 13.Nitrostat 0.4 mg p.r.n. 14.Imodium 2 mg p.r.n. 15.Fentanyl patch 25 mcg daily. 16.Lortab 20 one tablet daily. 17.Multivitamin 1 tablet daily. 18.Os-Cal with vitamin D 1 tablet daily. 19.Lasix 20 mg daily. 20.Plavix 75 mg daily. 21.Toprol 500 mg daily. 22.Aspirin 81 mg daily.  SOCIAL HISTORY:  The patient is currently a resident of [address]. No history of alcohol, tobacco, or substance abuse.  FAMILY HISTORY:  Not available at this time.  PHYSICAL EXAMINATION:  GENERAL:  The patient is awake, but acutely sick looking. VITAL SIGNS:  Blood pressure 92/59, pulse 63, respiratory rate 18, temperature 97 degrees Fahrenheit. HEENT:  Pupils are equal and reactive. NECK:  Supple. CHEST:  Poor air entry, bilateral rhonchi and crackles at the base. CARDIOVASCULAR SYSTEM:  First and second heart sounds heard.  No murmur, no gallop. ABDOMEN:  Soft and lax.  Bowel sound is positive.  No mass or organomegaly. EXTREMITIES:  No leg edema.  CBC; WBC 13.3, hemoglobin 13.6, hematocrit 39.7, and platelets 184. BMP; sodium 134, potassium 3.4, chloride 21, glucose 156, BUN 19, creatinine 0.9, calcium 8.3.  ASSESSMENT: 1. Probably pneumonia. 2. Degenerative joint disease. 3. Dementia. 4. Atrial fibrillation. 5.  Hypertension. 6. History of coronary artery disease.  PLAN:  We will start the patient on empiric IV antibiotics.  We will continue her regular medications.  We will gradually rehydrate the patient.  We will also continue on nebulizer treatment.     Abbey Veith D. Felecia Shelling, MD     TDF/MEDQ  D:  02/26/2011  T:  02/26/2011  Job:  [id number]  Electronically Signed by Avon Gully MD on 03/09/2011 08:33:47 AM

## 2011-07-29 ENCOUNTER — Encounter (HOSPITAL_COMMUNITY): Payer: Self-pay

## 2011-07-29 ENCOUNTER — Emergency Department (HOSPITAL_COMMUNITY): Payer: Medicare Other

## 2011-07-29 ENCOUNTER — Other Ambulatory Visit: Payer: Self-pay

## 2011-07-29 ENCOUNTER — Emergency Department (HOSPITAL_COMMUNITY)
Admission: EM | Admit: 2011-07-29 | Discharge: 2011-07-30 | Disposition: A | Discharge status: Skilled Nursing Facility | Payer: Medicare Other | Attending: Emergency Medicine | Admitting: Emergency Medicine

## 2011-07-29 DIAGNOSIS — Z859 Personal history of malignant neoplasm, unspecified: Secondary | ICD-10-CM | POA: Insufficient documentation

## 2011-07-29 DIAGNOSIS — I4891 Unspecified atrial fibrillation: Secondary | ICD-10-CM | POA: Insufficient documentation

## 2011-07-29 DIAGNOSIS — Z882 Allergy status to sulfonamides status: Secondary | ICD-10-CM | POA: Insufficient documentation

## 2011-07-29 DIAGNOSIS — I10 Essential (primary) hypertension: Secondary | ICD-10-CM | POA: Insufficient documentation

## 2011-07-29 DIAGNOSIS — Z79899 Other long term (current) drug therapy: Secondary | ICD-10-CM | POA: Insufficient documentation

## 2011-07-29 DIAGNOSIS — F411 Generalized anxiety disorder: Secondary | ICD-10-CM | POA: Insufficient documentation

## 2011-07-29 DIAGNOSIS — F0390 Unspecified dementia without behavioral disturbance: Secondary | ICD-10-CM | POA: Insufficient documentation

## 2011-07-29 DIAGNOSIS — I2510 Atherosclerotic heart disease of native coronary artery without angina pectoris: Secondary | ICD-10-CM | POA: Insufficient documentation

## 2011-07-29 DIAGNOSIS — R079 Chest pain, unspecified: Secondary | ICD-10-CM | POA: Insufficient documentation

## 2011-07-29 DIAGNOSIS — Z885 Allergy status to narcotic agent status: Secondary | ICD-10-CM | POA: Insufficient documentation

## 2011-07-29 DIAGNOSIS — F329 Major depressive disorder, single episode, unspecified: Secondary | ICD-10-CM | POA: Insufficient documentation

## 2011-07-29 DIAGNOSIS — Z880 Allergy status to penicillin: Secondary | ICD-10-CM | POA: Insufficient documentation

## 2011-07-29 DIAGNOSIS — F3289 Other specified depressive episodes: Secondary | ICD-10-CM | POA: Insufficient documentation

## 2011-07-29 HISTORY — DX: Essential (primary) hypertension: I10

## 2011-07-29 HISTORY — DX: Anxiety disorder, unspecified: F41.9

## 2011-07-29 HISTORY — DX: Major depressive disorder, single episode, unspecified: F32.9

## 2011-07-29 HISTORY — DX: Depression, unspecified: F32.A

## 2011-07-29 LAB — BASIC METABOLIC PANEL
CO2: 27 mEq/L (ref 19–32)
Chloride: 102 mEq/L (ref 96–112)
Creatinine, Ser: 0.89 mg/dL (ref 0.50–1.10)
Glucose, Bld: 109 mg/dL — ABNORMAL HIGH (ref 70–99)

## 2011-07-29 LAB — DIFFERENTIAL
Basophils Absolute: 0 10*3/uL (ref 0.0–0.1)
Eosinophils Relative: 3 % (ref 0–5)
Lymphocytes Relative: 32 % (ref 12–46)
Lymphs Abs: 2.7 10*3/uL (ref 0.7–4.0)
Monocytes Absolute: 0.8 10*3/uL (ref 0.1–1.0)
Monocytes Relative: 9 % (ref 3–12)
Neutro Abs: 4.6 10*3/uL (ref 1.7–7.7)

## 2011-07-29 LAB — CBC
MCH: 31 pg (ref 26.0–34.0)
MCHC: 33.4 g/dL (ref 30.0–36.0)
Platelets: 254 10*3/uL (ref 150–400)

## 2011-07-29 LAB — CK TOTAL AND CKMB (NOT AT ARMC): Total CK: 34 U/L (ref 7–177)

## 2011-07-29 LAB — TROPONIN I: Troponin I: <0.3 ng/mL (ref <0.30)

## 2011-07-29 MED ORDER — SODIUM CHLORIDE 0.9 % IV SOLN: Freq: Once | INTRAVENOUS | Status: DC

## 2011-07-29 NOTE — ED Notes (Signed)
From [facility],    Alert, talking, knows she is in hospital, but not sure why.  Denies pain at present.  Skin warm and dry, color wnl.  Sinus rhythm.

## 2011-07-29 NOTE — ED Notes (Signed)
Spoke with pts son by phone

## 2011-07-29 NOTE — ED Notes (Signed)
Pt from [facility] with chest pain

## 2011-07-29 NOTE — ED Provider Notes (Signed)
Scribed for Donnetta Hutching, MD, the patient was seen in room APA15/APA15 . This chart was scribed by Ellie Lunch. This patient's care was started at 10:41 PM.   CSN: [id number] Arrival date & time: 07/29/2011  8:23 PM   Chief Complaint  Patient presents with  . Chest Pain     (Include location/radiation/quality/duration/timing/severity/associated sxs/prior treatment) HPI Level 5 caveat for dementia.  [name] is a 77 y.o. female brought in by ambulance from [facility] to the Emergency Department complaining of chest pain. PT has hx of dementia.  Past Medical History  Diagnosis Date  . Cancer   . Atrial fibrillation   . Hypertension   . Anxiety   . Depression   . Coronary artery disease    History reviewed. No pertinent past surgical history.  History reviewed. No pertinent family history.  History  Substance Use Topics  . Smoking status: Never Smoker   . Smokeless tobacco: Not on file  . Alcohol Use: No    Review of Systems  Unable to perform ROS: Dementia   Allergies  Codeine; Penicillins; and Sulfa antibiotics  Home Medications   Current Outpatient Rx  Name Route Sig Dispense Refill  . ACETAMINOPHEN 500 MG PO TABS Oral Take 500 mg by mouth every 6 (six) hours as needed. For pain     . ALPRAZOLAM 0.25 MG PO TABS Oral Take 0.25 mg by mouth 2 (two) times daily as needed. For agitation and/or anxiety     . AMLODIPINE BESY-BENAZEPRIL HCL 5-20 MG PO CAPS Oral Take 1 capsule by mouth daily.      . ASPIRIN EC 81 MG PO TBEC Oral Take 81 mg by mouth daily.      Marland Kitchen CALCIUM CARB-CHOLECALCIFEROL 500-400 MG-UNIT PO TABS Oral Take 1 tablet by mouth daily.      Marland Kitchen CLOPIDOGREL BISULFATE 75 MG PO TABS Oral Take 75 mg by mouth daily.      . FENTANYL 25 MCG/HR TD PT72 Transdermal Place 1 patch onto the skin every 3 (three) days. Remove used patches before applying a new patch     . FERROUS SULFATE 325 (65 FE) MG PO TABS Oral Take 325 mg by mouth 2 (two) times daily.       Marland Kitchen DIALYVITE 3000 3 MG PO TABS Oral Take 1 tablet by mouth daily.      . FUROSEMIDE 20 MG PO TABS Oral Take 20 mg by mouth daily.      Marland Kitchen HYDROCODONE-ACETAMINOPHEN 5-500 MG PO TABS Oral Take 1 tablet by mouth every 6 (six) hours as needed. For pain     . ISOSORBIDE MONONITRATE CR 30 MG PO TB24 Oral Take 30 mg by mouth daily.      . MEGESTROL ACETATE 40 MG/ML PO SUSP Oral Take 400 mg by mouth daily.      Marland Kitchen METOPROLOL SUCCINATE 50 MG PO TB24 Oral Take 50 mg by mouth at bedtime.      Marland Kitchen NITROGLYCERIN 0.4 MG SL SUBL Sublingual Place 0.4 mg under the tongue every 5 (five) minutes as needed.      Marland Kitchen PANTOPRAZOLE SODIUM 40 MG PO TBEC Oral Take 40 mg by mouth daily.      Marland Kitchen POTASSIMIN PO Oral Take 1 tablet by mouth daily.      . SENNA 8.6 MG PO TABS Oral Take 2 tablets by mouth.      Marland Kitchen SIMVASTATIN 40 MG PO TABS Oral Take 40 mg by mouth at bedtime.      Marland Kitchen  VITAMIN D (ERGOCALCIFEROL) 50000 UNITS PO CAPS Oral Take 50,000 Units by mouth every 30 (thirty) days.      Marland Kitchen ZINC SULFATE 220 MG PO CAPS Oral Take 220 mg by mouth daily.        Physical Exam    BP 138/70  Pulse 65  Temp(Src) 98.1 F (36.7 C) (Oral)  Resp 18  SpO2 97%  Physical Exam  Nursing note and vitals reviewed. Constitutional: She appears well-developed and well-nourished. No distress.  HENT:  Head: Normocephalic and atraumatic.  Eyes: Conjunctivae and EOM are normal.  Neck: Normal range of motion. Neck supple.  Cardiovascular: Normal rate, regular rhythm and normal heart sounds.   Pulmonary/Chest: Effort normal and breath sounds normal.  Abdominal: Soft. Bowel sounds are normal.  Musculoskeletal: Normal range of motion. She exhibits no edema.  Neurological: She is alert.       PT is not oriented to time or place.   Skin: Skin is warm and dry.  Psychiatric: She has a normal mood and affect.   Procedures  OTHER DATA REVIEWED: Nursing notes, vital signs, and past medical records reviewed.   DIAGNOSTIC STUDIES: Oxygen  Saturation is 97% on room air, normal by my interpretation.    Date: 07/29/2011  Rate: 70  Rhythm: normal sinus rhythm  QRS Axis: left  Intervals: normal  ST/T Wave abnormalities: normal  Conduction Disutrbances:none  Narrative Interpretation:   Old EKG Reviewed: none available  LABS / RADIOLOGY:  Results for orders placed during the hospital encounter of 07/29/11  CBC      Component Value Range   WBC 8.4  4.0 - 10.5 (K/uL)   RBC 4.71  3.87 - 5.11 (MIL/uL)   Hemoglobin 14.6  12.0 - 15.0 (g/dL)   HCT 40.9  81.1 - 91.4 (%)   MCV 92.8  78.0 - 100.0 (fL)   MCH 31.0  26.0 - 34.0 (pg)   MCHC 33.4  30.0 - 36.0 (g/dL)   RDW 78.2  95.6 - 21.3 (%)   Platelets 254  150 - 400 (K/uL)  DIFFERENTIAL      Component Value Range   Neutrophils Relative 55  43 - 77 (%)   Neutro Abs 4.6  1.7 - 7.7 (K/uL)   Lymphocytes Relative 32  12 - 46 (%)   Lymphs Abs 2.7  0.7 - 4.0 (K/uL)   Monocytes Relative 9  3 - 12 (%)   Monocytes Absolute 0.8  0.1 - 1.0 (K/uL)   Eosinophils Relative 3  0 - 5 (%)   Eosinophils Absolute 0.3  0.0 - 0.7 (K/uL)   Basophils Relative 1  0 - 1 (%)   Basophils Absolute 0.0  0.0 - 0.1 (K/uL)  BASIC METABOLIC PANEL      Component Value Range   Sodium 138  135 - 145 (mEq/L)   Potassium 3.9  3.5 - 5.1 (mEq/L)   Chloride 102  96 - 112 (mEq/L)   CO2 27  19 - 32 (mEq/L)   Glucose, Bld 109 (*) 70 - 99 (mg/dL)   BUN 22  6 - 23 (mg/dL)   Creatinine, Ser 0.86  0.50 - 1.10 (mg/dL)   Calcium 9.3  8.4 - 57.8 (mg/dL)   GFR calc non Af Amer 59 (*) >60 (mL/min)   GFR calc Af Amer >60  >60 (mL/min)  CK TOTAL AND CKMB      Component Value Range   Total CK 34  7 - 177 (U/L)   CK, MB 1.7  0.3 -  4.0 (ng/mL)   Relative Index RELATIVE INDEX IS INVALID  0.0 - 2.5   TROPONIN I      Component Value Range   Troponin I <0.30  <0.30 (ng/mL)   Dg Chest Portable 1 View  07/29/2011  *RADIOLOGY REPORT*  Clinical Data: Chest pain.  Weakness.  PORTABLE CHEST - 1 VIEW  Comparison: 02/27/2011   Findings: The heart is enlarged.  Coronary stent is present.  There are perihilar bronchitic changes.  Biapical pleural parenchymal changes appears stable.  There are no focal consolidations or pleural effusions.  No pulmonary edema.  There are marked degenerative changes in the shoulders.  IMPRESSION:  1.  Cardiomegaly without edema. 2.  Bronchitic changes without focal pulmonary abnormality.  Original Report Authenticated By: Patterson Hammersmith, M.D.   ED COURSE / COORDINATION OF CARE: 22:45 EDP at PT bedside. PT is not oriented to place or time and is in NAD. Plan to run cardiac panel and discharge.  Orders Placed This Encounter  Procedures  . DG Chest Portable 1 View  . CBC  . Differential  . Basic metabolic panel  . CK total and CKMB  . Troponin I  . EKG test   Medications  0.9 %  sodium chloride infusion (not administered)   MDM: Patient had fleeting chest pain approximately 2 hours ago. Feeling totally normal now.  diagnostic workup negative  IMPRESSION: Diagnoses that have been ruled out:  Diagnoses that are still under consideration:  Final diagnoses:    MEDICATIONS GIVEN IN THE E.D.  Lynford Humphrey ATTESTATION: I personally performed the services described in this documentation, which was scribed in my presence. The recorded information has been reviewed and considered. Donnetta Hutching, MD          Donnetta Hutching, MD 07/29/11 [phone]

## 2011-09-06 ENCOUNTER — Other Ambulatory Visit: Payer: Self-pay

## 2011-09-06 ENCOUNTER — Encounter (HOSPITAL_COMMUNITY): Payer: Self-pay | Admitting: *Deleted

## 2011-09-06 ENCOUNTER — Emergency Department (HOSPITAL_COMMUNITY): Payer: Medicare Other

## 2011-09-06 ENCOUNTER — Inpatient Hospital Stay (HOSPITAL_COMMUNITY)
Admission: EM | Admit: 2011-09-06 | Discharge: 2011-09-07 | DRG: 313 | Disposition: A | Discharge status: Nursing Home (Includes ICF) | Payer: Medicare Other | Attending: Emergency Medicine | Admitting: Emergency Medicine

## 2011-09-06 DIAGNOSIS — G894 Chronic pain syndrome: Secondary | ICD-10-CM | POA: Diagnosis present

## 2011-09-06 DIAGNOSIS — F341 Dysthymic disorder: Secondary | ICD-10-CM | POA: Diagnosis present

## 2011-09-06 DIAGNOSIS — F0390 Unspecified dementia without behavioral disturbance: Secondary | ICD-10-CM | POA: Diagnosis present

## 2011-09-06 DIAGNOSIS — I10 Essential (primary) hypertension: Secondary | ICD-10-CM | POA: Diagnosis present

## 2011-09-06 DIAGNOSIS — K219 Gastro-esophageal reflux disease without esophagitis: Secondary | ICD-10-CM | POA: Diagnosis present

## 2011-09-06 DIAGNOSIS — I4891 Unspecified atrial fibrillation: Secondary | ICD-10-CM | POA: Diagnosis present

## 2011-09-06 DIAGNOSIS — R0789 Other chest pain: Principal | ICD-10-CM | POA: Diagnosis present

## 2011-09-06 LAB — MRSA PCR SCREENING: MRSA by PCR: NEGATIVE

## 2011-09-06 LAB — BASIC METABOLIC PANEL
Chloride: 105 mEq/L (ref 96–112)
GFR calc Af Amer: 66 mL/min — ABNORMAL LOW (ref >90)
GFR calc non Af Amer: 57 mL/min — ABNORMAL LOW (ref >90)
Glucose, Bld: 88 mg/dL (ref 70–99)
Potassium: 3.6 mEq/L (ref 3.5–5.1)
Sodium: 142 mEq/L (ref 135–145)

## 2011-09-06 LAB — CARDIAC PANEL(CRET KIN+CKTOT+MB+TROPI)
CK, MB: 2.6 ng/mL (ref 0.3–4.0)
Relative Index: INVALID (ref 0.0–2.5)
Troponin I: <0.3 ng/mL (ref <0.30)

## 2011-09-06 LAB — CBC
Hemoglobin: 14.9 g/dL (ref 12.0–15.0)
MCHC: 33 g/dL (ref 30.0–36.0)
WBC: 8.4 10*3/uL (ref 4.0–10.5)

## 2011-09-06 MED ORDER — ALPRAZOLAM 0.25 MG PO TABS
0.2500 mg | ORAL_TABLET | Freq: Two times a day (BID) | ORAL | Status: DC | PRN
Start: 2011-09-06 — End: 2011-09-07
  Administered 2011-09-06: 0.25 mg via ORAL
  Filled 2011-09-06: qty 1

## 2011-09-06 MED ORDER — CHOLECALCIFEROL 10 MCG (400 UNIT) PO TABS
400.0000 [IU] | ORAL_TABLET | Freq: Every day | ORAL | Status: DC
  Administered 2011-09-07: 400 [IU] via ORAL
  Filled 2011-09-06 (×2): qty 1

## 2011-09-06 MED ORDER — LOPERAMIDE HCL 2 MG PO CAPS: 2.0000 mg | ORAL_CAPSULE | Freq: Every day | ORAL | Status: DC | PRN

## 2011-09-06 MED ORDER — FERROUS SULFATE 325 (65 FE) MG PO TABS
325.0000 mg | ORAL_TABLET | Freq: Two times a day (BID) | ORAL | Status: DC
  Administered 2011-09-06 – 2011-09-07 (×2): 325 mg via ORAL
  Filled 2011-09-06 (×4): qty 1

## 2011-09-06 MED ORDER — ASPIRIN EC 81 MG PO TBEC
81.0000 mg | DELAYED_RELEASE_TABLET | Freq: Every day | ORAL | Status: DC
  Administered 2011-09-06 – 2011-09-07 (×2): 81 mg via ORAL
  Filled 2011-09-06 (×4): qty 1

## 2011-09-06 MED ORDER — POTASSIUM CHLORIDE CRYS ER 20 MEQ PO TBCR
20.0000 meq | EXTENDED_RELEASE_TABLET | Freq: Every day | ORAL | Status: DC
  Administered 2011-09-06 – 2011-09-07 (×2): 20 meq via ORAL
  Filled 2011-09-06 (×2): qty 1

## 2011-09-06 MED ORDER — METOPROLOL SUCCINATE ER 50 MG PO TB24
50.0000 mg | ORAL_TABLET | Freq: Every day | ORAL | Status: DC
  Administered 2011-09-06: 50 mg via ORAL
  Filled 2011-09-06 (×2): qty 1

## 2011-09-06 MED ORDER — SODIUM CHLORIDE 0.9 % IV SOLN
INTRAVENOUS | Status: DC
  Administered 2011-09-06 – 2011-09-07 (×2): via INTRAVENOUS

## 2011-09-06 MED ORDER — SIMVASTATIN 40 MG PO TABS
40.0000 mg | ORAL_TABLET | Freq: Every day | ORAL | Status: DC
  Filled 2011-09-06: qty 1

## 2011-09-06 MED ORDER — PANTOPRAZOLE SODIUM 40 MG PO TBEC
40.0000 mg | DELAYED_RELEASE_TABLET | Freq: Every day | ORAL | Status: DC
  Administered 2011-09-06: 40 mg via ORAL
  Filled 2011-09-06: qty 1

## 2011-09-06 MED ORDER — FUROSEMIDE 20 MG PO TABS
20.0000 mg | ORAL_TABLET | Freq: Every day | ORAL | Status: DC
  Administered 2011-09-06 – 2011-09-07 (×2): 20 mg via ORAL
  Filled 2011-09-06 (×2): qty 1

## 2011-09-06 MED ORDER — HYDROCODONE-ACETAMINOPHEN 5-325 MG PO TABS
1.0000 | ORAL_TABLET | Freq: Four times a day (QID) | ORAL | Status: DC | PRN
  Administered 2011-09-06 – 2011-09-07 (×3): 1 via ORAL
  Filled 2011-09-06 (×3): qty 1

## 2011-09-06 MED ORDER — BENAZEPRIL HCL 10 MG PO TABS
20.0000 mg | ORAL_TABLET | Freq: Every day | ORAL | Status: DC
  Administered 2011-09-06 – 2011-09-07 (×2): 20 mg via ORAL
  Filled 2011-09-06 (×2): qty 2
  Filled 2011-09-06 (×2): qty 1

## 2011-09-06 MED ORDER — VITAMIN D (ERGOCALCIFEROL) 1.25 MG (50000 UNIT) PO CAPS: 50000.0000 [IU] | ORAL_CAPSULE | ORAL | Status: DC

## 2011-09-06 MED ORDER — ONDANSETRON HCL 4 MG/2ML IJ SOLN
4.0000 mg | Freq: Once | INTRAMUSCULAR | Status: AC
  Administered 2011-09-06: 4 mg via INTRAVENOUS
  Filled 2011-09-06: qty 2

## 2011-09-06 MED ORDER — ASPIRIN 325 MG PO TABS
325.0000 mg | ORAL_TABLET | Freq: Once | ORAL | Status: AC
  Administered 2011-09-06: 325 mg via ORAL
  Filled 2011-09-06: qty 1

## 2011-09-06 MED ORDER — CLOPIDOGREL BISULFATE 75 MG PO TABS
75.0000 mg | ORAL_TABLET | Freq: Every day | ORAL | Status: DC
  Administered 2011-09-06 – 2011-09-07 (×2): 75 mg via ORAL
  Filled 2011-09-06 (×2): qty 1

## 2011-09-06 MED ORDER — CALCIUM CARBONATE 1250 (500 CA) MG PO TABS
1.0000 | ORAL_TABLET | Freq: Every day | ORAL | Status: DC
  Administered 2011-09-06: 500 mg via ORAL
  Administered 2011-09-07: 400 mg via ORAL
  Filled 2011-09-06 (×4): qty 1

## 2011-09-06 MED ORDER — ROSUVASTATIN CALCIUM 20 MG PO TABS
10.0000 mg | ORAL_TABLET | Freq: Every day | ORAL | Status: DC
  Administered 2011-09-06: 10 mg via ORAL
  Filled 2011-09-06 (×2): qty 1

## 2011-09-06 MED ORDER — NITROGLYCERIN 0.4 MG SL SUBL: 0.4000 mg | SUBLINGUAL_TABLET | SUBLINGUAL | Status: DC | PRN

## 2011-09-06 MED ORDER — ISOSORBIDE MONONITRATE ER 60 MG PO TB24
30.0000 mg | ORAL_TABLET | Freq: Every day | ORAL | Status: DC
  Administered 2011-09-06 – 2011-09-07 (×2): 30 mg via ORAL
  Filled 2011-09-06 (×4): qty 1

## 2011-09-06 MED ORDER — ACETAMINOPHEN 500 MG PO TABS: 500.0000 mg | ORAL_TABLET | Freq: Four times a day (QID) | ORAL | Status: DC | PRN

## 2011-09-06 MED ORDER — AMLODIPINE BESYLATE 5 MG PO TABS
5.0000 mg | ORAL_TABLET | Freq: Every day | ORAL | Status: DC
  Administered 2011-09-06 – 2011-09-07 (×2): 5 mg via ORAL
  Filled 2011-09-06 (×2): qty 1

## 2011-09-06 MED ORDER — FENTANYL 25 MCG/HR TD PT72
25.0000 ug | MEDICATED_PATCH | TRANSDERMAL | Status: DC
  Administered 2011-09-07: 25 ug via TRANSDERMAL
  Filled 2011-09-06: qty 1

## 2011-09-06 MED ORDER — SENNA 8.6 MG PO TABS
2.0000 | ORAL_TABLET | Freq: Every day | ORAL | Status: DC
  Administered 2011-09-06 – 2011-09-07 (×2): 17.2 mg via ORAL
  Filled 2011-09-06 (×4): qty 2

## 2011-09-06 MED ORDER — NEPHRO-VITE 0.8 MG PO TABS
1.0000 | ORAL_TABLET | Freq: Every day | ORAL | Status: DC
  Administered 2011-09-06: 1 via ORAL
  Filled 2011-09-06 (×2): qty 1

## 2011-09-06 NOTE — ED Provider Notes (Signed)
History  Scribed for Shelda Jakes, MD, the patient was seen in APA05/APA05. The chart was scribed by Gilman Schmidt. The patients care was started at 8:45 AM. CSN: [id number] Arrival date & time: 09/06/2011  8:11 AM   First MD Initiated Contact with Patient 09/06/11 [phone]     Chief Complaint  Patient presents with  . Chest Pain   The history is limited by the condition of the patient.  Level 5 Caveat  [name] is a 77 y.o. female with a history of CAD and Atrial fibrillation who presents to the Emergency Department complaining of substernal chest pain onset yesterday. Pt denies any current pain. Denies any N/V. Pain was at a 4/10. Reports that pain radiated to neck. Resident at [facility]. Pt alert and oriented at this time. There are no other associated symptoms and no other alleviating or aggravating factors.  PCP: Dr. Felecia Shelling     Past Medical History  Diagnosis Date  . Cancer   . Atrial fibrillation   . Hypertension   . Anxiety   . Depression   . Coronary artery disease     No past surgical history on file.  No family history on file.  History  Substance Use Topics  . Smoking status: Never Smoker   . Smokeless tobacco: Not on file  . Alcohol Use: No    OB History    Grav Para Term Preterm Abortions TAB SAB Ect Mult Living                  Review of Systems  Unable to perform ROS HENT: Positive for neck pain.   Respiratory: Negative for cough, shortness of breath and wheezing.   Cardiovascular: Positive for chest pain. Negative for leg swelling.  Gastrointestinal: Negative for nausea and diarrhea.  Neurological: Negative for dizziness.  All other systems reviewed and are negative.   Level 5 Caveat. History is limited by the condition of the patient.   Allergies  Codeine; Penicillins; and Sulfa antibiotics  Home Medications   Current Outpatient Rx  Name Route Sig Dispense Refill  . ACETAMINOPHEN 500 MG PO TABS Oral Take 500 mg by mouth  every 6 (six) hours as needed. For pain     . ALPRAZOLAM 0.25 MG PO TABS Oral Take 0.25 mg by mouth 2 (two) times daily as needed. For agitation and/or anxiety     . AMLODIPINE BESY-BENAZEPRIL HCL 5-20 MG PO CAPS Oral Take 1 capsule by mouth daily.      . ASPIRIN EC 81 MG PO TBEC Oral Take 81 mg by mouth daily.      Marland Kitchen CALCIUM CARB-CHOLECALCIFEROL 500-400 MG-UNIT PO TABS Oral Take 1 tablet by mouth daily.      Marland Kitchen CLOPIDOGREL BISULFATE 75 MG PO TABS Oral Take 75 mg by mouth daily.      . FENTANYL 25 MCG/HR TD PT72 Transdermal Place 1 patch onto the skin every 3 (three) days. Remove used patches before applying a new patch     . FERROUS SULFATE 325 (65 FE) MG PO TABS Oral Take 325 mg by mouth 2 (two) times daily.      Marland Kitchen DIALYVITE 3000 3 MG PO TABS Oral Take 1 tablet by mouth daily.      . FUROSEMIDE 20 MG PO TABS Oral Take 20 mg by mouth daily.      Marland Kitchen HYDROCODONE-ACETAMINOPHEN 5-500 MG PO TABS Oral Take 1 tablet by mouth every 6 (six) hours as needed. For pain     .  ISOSORBIDE MONONITRATE ER 30 MG PO TB24 Oral Take 30 mg by mouth daily.      . MEGESTROL ACETATE 40 MG/ML PO SUSP Oral Take 400 mg by mouth daily.      Marland Kitchen METOPROLOL SUCCINATE 50 MG PO TB24 Oral Take 50 mg by mouth at bedtime.      Marland Kitchen NITROGLYCERIN 0.4 MG SL SUBL Sublingual Place 0.4 mg under the tongue every 5 (five) minutes as needed.      Marland Kitchen PANTOPRAZOLE SODIUM 40 MG PO TBEC Oral Take 40 mg by mouth daily.      Marland Kitchen POTASSIMIN PO Oral Take 1 tablet by mouth daily.      . SENNA 8.6 MG PO TABS Oral Take 2 tablets by mouth.      Marland Kitchen SIMVASTATIN 40 MG PO TABS Oral Take 40 mg by mouth at bedtime.      Marland Kitchen VITAMIN D (ERGOCALCIFEROL) 50000 UNITS PO CAPS Oral Take 50,000 Units by mouth every 30 (thirty) days.      Marland Kitchen ZINC SULFATE 220 MG PO CAPS Oral Take 220 mg by mouth daily.        There were no vitals taken for this visit.  Physical Exam  Constitutional: She is oriented to person, place, and time. She appears well-developed and well-nourished.   Non-toxic appearance. She does not have a sickly appearance.  HENT:  Head: Normocephalic and atraumatic.  Mouth/Throat: Oropharynx is clear and moist.  Eyes: Conjunctivae, EOM and lids are normal. Pupils are equal, round, and reactive to light. No scleral icterus.  Neck: Trachea normal and normal range of motion. Neck supple.  Cardiovascular: Normal rate, regular rhythm and normal heart sounds.   Pulmonary/Chest: Effort normal and breath sounds normal.  Abdominal: Soft. Normal appearance and bowel sounds are normal. There is no tenderness. There is no rebound, no guarding and no CVA tenderness.  Musculoskeletal: Normal range of motion.  Neurological: She is alert and oriented to person, place, and time. She has normal strength. No cranial nerve deficit. Coordination normal.  Skin: Skin is warm, dry and intact. No rash noted.    ED Course  Procedures  DIAGNOSTIC STUDIES:   Date: 09/06/2011  Rate: 75  Rhythm: normal sinus rhythm  QRS Axis: normal  Intervals: normal  ST/T Wave abnormalities: nonspecific ST/T changes  Conduction Disutrbances:left anterior fascicular block  Narrative Interpretation:   Old EKG Reviewed: unchanged 07/29/11  COORDINATION OF CARE: 8:45am  - Patient evaluated by ED physician, ASA, Zofran, DG Chest, labs ordered   Results for orders placed during the hospital encounter of 09/06/11  CBC      Component Value Range   WBC 8.4  4.0 - 10.5 (K/uL)   RBC 4.85  3.87 - 5.11 (MIL/uL)   Hemoglobin 14.9  12.0 - 15.0 (g/dL)   HCT 16.1  09.6 - 04.5 (%)   MCV 93.0  78.0 - 100.0 (fL)   MCH 30.7  26.0 - 34.0 (pg)   MCHC 33.0  30.0 - 36.0 (g/dL)   RDW 40.9  81.1 - 91.4 (%)   Platelets 218  150 - 400 (K/uL)  BASIC METABOLIC PANEL      Component Value Range   Sodium 142  135 - 145 (mEq/L)   Potassium 3.6  3.5 - 5.1 (mEq/L)   Chloride 105  96 - 112 (mEq/L)   CO2 28  19 - 32 (mEq/L)   Glucose, Bld 88  70 - 99 (mg/dL)   BUN 15  6 - 23 (mg/dL)  Creatinine, Ser 0.86   0.50 - 1.10 (mg/dL)   Calcium 9.6  8.4 - 16.1 (mg/dL)   GFR calc non Af Amer 57 (*) >90 (mL/min)   GFR calc Af Amer 66 (*) >90 (mL/min)  CARDIAC PANEL(CRET KIN+CKTOT+MB+TROPI)      Component Value Range   Total CK 47  7 - 177 (U/L)   CK, MB 2.6  0.3 - 4.0 (ng/mL)   Troponin I <0.30  <0.30 (ng/mL)   Relative Index RELATIVE INDEX IS INVALID  0.0 - 2.5      DG Chest 1 View. Reviewed by me.  IMPRESSION: Enlargement of cardiac silhouette. Emphysematous changes. No acute abnormalities. Original Report Authenticated By: Lollie Marrow, M.D.    MDM  Patient is not a reliable historian. Initial cardiac workup is negative no acute changes on EKG cardiac markers negative chest x-ray negative. Discussed with admitting doctor for Dr. Felecia Shelling. He will admit patient for rule out. Admit orders taken over the phone by the nurse.  I personally performed the services described in this documentation, which was scribed in my presence. The recorded information has been reviewed and considered.          Shelda Jakes, MD 09/06/11 1011

## 2011-09-06 NOTE — ED Notes (Signed)
Assisted pt on and off bedpan.  Repositioned.  nad noted.

## 2011-09-06 NOTE — ED Notes (Signed)
Sudden onset of central cp that started after waking this morning.  Pt denies radiation.  C/o neck pain.  Denies sob/dizziness, experiencing nausea. Resident at [facility].  Pt alert and oriented at this time.

## 2011-09-06 NOTE — H&P (Signed)
  [id number] 

## 2011-09-06 NOTE — ED Notes (Signed)
Pt resting comfortably at present. Pt pulled up and repositioned in bed. Family at bedside. VSS. Awaiting bed placement on floor.

## 2011-09-06 NOTE — ED Notes (Signed)
Pt being transferred to floor via stretcher with telemetry.

## 2011-09-07 ENCOUNTER — Other Ambulatory Visit: Payer: Self-pay

## 2011-09-07 LAB — CARDIAC PANEL(CRET KIN+CKTOT+MB+TROPI)
CK, MB: 2.3 ng/mL (ref 0.3–4.0)
Relative Index: INVALID (ref 0.0–2.5)
Total CK: 69 U/L (ref 7–177)
Troponin I: <0.3 ng/mL (ref <0.30)

## 2011-09-07 NOTE — Progress Notes (Deleted)
NAME:  [name], [name]           ACCOUNT NO.:  [id number]  MEDICAL RECORD NO.:  [id number]  LOCATION:  APOTF                         FACILITY:  [facility]  PHYSICIAN:  GEBRESELASSIE NIDA, MD DATE OF BIRTH:  Jun 06, 1917  DATE OF PROCEDURE: DATE OF DISCHARGE:  09/07/2011                                PROGRESS NOTE   DISCHARGE DIAGNOSES:  Chest pain, long-term facility resident, dementia, atrial fibrillation, hypertension, anxiety/depression, history of left fracture of hip, chronic pain syndrome, gastroesophageal reflux disease, remote history of coronary artery disease.  DISCHARGE MEDICATIONS: 1. Amlodipine 5 mg p.o. daily. 2. Aspirin 81 mg daily. 3. Vitamin B complex 1 tab daily. 4. Benazepril 20 mg daily. 5. Calcium with vitamin D 1 tablet daily. 6. Plavix 75 mg daily. 7. Vitamin D 16109 units monthly. 8. Fentanyl patch 25 mcg every 72 hours. 9. Ferrous sulfate 325 mg 1 p.o. b.i.d. with meals. 10.Furosemide 20 mg daily. 11.Imdur 30 mg daily. 12.Metoprolol 50 mg at bedtime. 13.Protonix 40 mg daily. 14.Crestor 10 mg daily. 15.Senna 17.2 mg p.o. daily.  DISPOSITION:  The patient is being discharged to [facility].  HOSPITAL COURSE:  This is a 77 year old female patient with history of multiple medical problems as above, admitted yesterday with complaint of atypical chest pain for ruling out of acute coronary syndrome.  Her serial EKGs and troponins were negative.  The patient was hydrated and oral medications were continued accordingly.  Her hospital stay was uneventful.  The patient gradually improved.  Hence she is being transferred to her nursing home to continue her regular care.  DISCHARGE INSTRUCTIONS:  The patient will be followed in office with Dr. Felecia Shelling and nursing home by Dr. Felecia Shelling as well.          ______________________________ Purcell Nails, MD     GN/MEDQ  D:  09/07/2011  T:  09/07/2011  Job:  [id number]

## 2011-09-07 NOTE — Discharge Summary (Signed)
  [id number] 

## 2011-09-07 NOTE — H&P (Signed)
Morgan, Garcia           ACCOUNT NO.:  [id number]  MEDICAL RECORD NO.:  [id number]  LOCATION:  [facility]                         FACILITY:  [facility]  PHYSICIAN:  Purcell Nails, MD DATE OF BIRTH:  11-14-1917  DATE OF ADMISSION:  09/06/2011 DATE OF DISCHARGE:  LH                             HISTORY & PHYSICAL   CHIEF COMPLAINT:  Chest pain.  HISTORY OF PRESENT ILLNESS:  This is a 77 year old female patient with multiple medical problems including remote coronary artery disease, hypertension, depression, anxiety, atrial fibrillation.  She is a resident of [address]  Brought to the emergency room because of complaint of poorly localized anterior chest pain.  She is a poor historian.  History was obtained from documented records.  In the ER, her EKG was normal.  Her first set of troponin was also normal.  She denied actually pain in the ER by the time when I talked to her on the floor.  She is granted admission to rule out acute coronary syndrome.  PAST MEDICAL HISTORY:  As above.  PAST SURGICAL HISTORY:  Laparotomy in the remote past.  FAMILY HISTORY:  Unremarkable.  SOCIAL HISTORY:  Negative for alcohol, smoking, and drug use.  HOME MEDICATIONS:  Tylenol, alprazolam, amlodipine, aspirin, calcium with vitamin D, Plavix, fentanyl patch, ferrous sulfate, furosemide, hydrocodone, Imdur, megestrol, metoprolol, nitroglycerin, pantoprazole, senna, zinc sulfate.  REVIEW OF SYSTEMS:  Unobtainable except for what is mentioned in the HPI.  PHYSICAL EXAMINATION:  GENERAL:  She is a frail elderly lady who is not in acute distress.  Oriented to place and person.  Denies any acute discomfort. HEENT:  Shows moist mucous membranes.  No icterus or pallor. NECK:  Negative for JVD. CHEST:  Clear to auscultation bilaterally. CARDIOVASCULAR:  Distant heart sounds.  Otherwise no murmur. ABDOMEN:  Soft and nontender. EXTREMITIES:  No edema. CNS:  No focal deficit. SKIN:  No rash,  no hyperemia.  LABORATORY WORK:  Sodium 142, potassium 3.6, chloride 105, bicarb 20, BUN 15, creatinine 0.86, calcium 9.6, troponin less than 0.3, hemoglobin 14.9.  Chest x-ray was significant for enlarged cardiac silhouette. Emphysematous changes.  No acute abnormalities.  EKG was unremarkable.  ASSESSMENT:  Chest pain, prior coronary artery disease, hypertension, anxiety, atrial fibrillation.  PLAN:  Admit for ruling out acute coronary syndrome.  Continue troponin and EKG every 8 hours x2.  We will continue her home medications, oxygen support, nitroglycerin for chest pain p.r.n.  Anticipate admission for 1 day.  Chest pain seems to be atypical.  At this point, we will not consult Cardiology.  We will utilize cardiac diet.  Continue Plavix and aspirin as an outpatient.                                           ______________________________ Purcell Nails, MD     GN/MEDQ  D:  09/06/2011  T:  09/07/2011  Job:  [id number]

## 2011-09-07 NOTE — Progress Notes (Signed)
Discharged via wheelchair escorted by staff and [employer].  Stable upon discharge.

## 2011-09-12 ENCOUNTER — Other Ambulatory Visit: Payer: Self-pay | Admitting: *Deleted

## 2011-09-12 MED ORDER — NITROGLYCERIN 0.4 MG SL SUBL: 0.4000 mg | SUBLINGUAL_TABLET | SUBLINGUAL | Status: DC | PRN

## 2011-09-12 NOTE — H&P (Signed)
NAME:  SHALAWN, WYNDER           ACCOUNT NO.:  [id number]  MEDICAL RECORD NO.:  [id number]  LOCATION:  [facility]                         FACILITY:  [facility]  PHYSICIAN:  Ariani Seier, MD DATE OF BIRTH:  Aug 16, 1917  DATE OF ADMISSION:  09/06/2011 DATE OF DISCHARGE:  10/24/2012LH                              DISCHARGE SUMMARY   DISCHARGE DIAGNOSES:  Chest pain, long-term facility resident, dementia, atrial fibrillation, hypertension, anxiety/depression, history of left fracture of hip, chronic pain syndrome, gastroesophageal reflux disease, remote history of coronary artery disease.  DISCHARGE MEDICATIONS: 1. Amlodipine 5 mg p.o. daily. 2. Aspirin 81 mg daily. 3. Vitamin B complex 1 tab daily. 4. Benazepril 20 mg daily. 5. Calcium with vitamin D 1 tablet daily. 6. Plavix 75 mg daily. 7. Vitamin D 16109 units monthly. 8. Fentanyl patch 25 mcg every 72 hours. 9. Ferrous sulfate 325 mg 1 p.o. b.i.d. with meals. 10.Furosemide 20 mg daily. 11.Imdur 30 mg daily. 12.Metoprolol 50 mg at bedtime. 13.Protonix 40 mg daily. 14.Crestor 10 mg daily. 15.Senna 17.2 mg p.o. daily.  DISPOSITION:  The patient is being discharged to [facility].  HOSPITAL COURSE:  This is a 77 year old female patient with history of multiple medical problems as above, admitted yesterday with complaint of atypical chest pain for ruling out of acute coronary syndrome.  Her serial EKGs and troponins were negative.  The patient was hydrated and oral medications were continued accordingly.  Her hospital stay was uneventful.  The patient gradually improved.  Hence she is being transferred to her nursing home to continue her regular care.  DISCHARGE INSTRUCTIONS:  The patient will be followed in office with Dr. Felecia Shelling and nursing home by Dr. Felecia Shelling as well.                                           ______________________________ Purcell Nails, MD     GN/MEDQ  D:  09/07/2011  T:  09/07/2011  Job:   [id number]

## 2011-10-13 ENCOUNTER — Ambulatory Visit: Payer: Medicare Other | Admitting: Cardiovascular Disease

## 2011-10-26 ENCOUNTER — Telehealth: Payer: Self-pay | Admitting: Cardiovascular Disease

## 2011-10-26 NOTE — Telephone Encounter (Signed)
Called son and informed we will schedule her for first available in [place], informed him our office will call him tomorrow with date and time. Dr Elease Hashimoto aware and agreed it would be better for the pt.

## 2011-10-26 NOTE — Telephone Encounter (Signed)
Pt's son cancelled appt tomorrow with nahser, pt very frail and cannot take riding for an hour each way and taking the elevator it too much, wants to know if any dr can see her in [place]?

## 2011-10-27 ENCOUNTER — Ambulatory Visit: Payer: Medicare Other | Admitting: Cardiovascular Disease

## 2011-10-27 NOTE — Telephone Encounter (Signed)
[name] is schedule for 11/18/11  @ 1:45 with Dr. Dietrich Pates.   Patient son [name] is aware.

## 2011-11-18 ENCOUNTER — Ambulatory Visit (INDEPENDENT_AMBULATORY_CARE_PROVIDER_SITE_OTHER): Payer: Medicare Other | Admitting: Cardiology

## 2011-11-18 ENCOUNTER — Encounter: Payer: Self-pay | Admitting: Cardiology

## 2011-11-18 VITALS — BP 124/71 | HR 90 | Resp 16 | Ht 59.0 in | Wt 116.0 lb

## 2011-11-18 DIAGNOSIS — F0390 Unspecified dementia without behavioral disturbance: Secondary | ICD-10-CM

## 2011-11-18 DIAGNOSIS — I679 Cerebrovascular disease, unspecified: Secondary | ICD-10-CM

## 2011-11-18 DIAGNOSIS — I10 Essential (primary) hypertension: Secondary | ICD-10-CM

## 2011-11-18 DIAGNOSIS — I359 Nonrheumatic aortic valve disorder, unspecified: Secondary | ICD-10-CM

## 2011-11-18 DIAGNOSIS — C50919 Malignant neoplasm of unspecified site of unspecified female breast: Secondary | ICD-10-CM | POA: Insufficient documentation

## 2011-11-18 DIAGNOSIS — I480 Paroxysmal atrial fibrillation: Secondary | ICD-10-CM | POA: Insufficient documentation

## 2011-11-18 DIAGNOSIS — I2510 Atherosclerotic heart disease of native coronary artery without angina pectoris: Secondary | ICD-10-CM | POA: Insufficient documentation

## 2011-11-18 DIAGNOSIS — K219 Gastro-esophageal reflux disease without esophagitis: Secondary | ICD-10-CM | POA: Insufficient documentation

## 2011-11-18 DIAGNOSIS — I350 Nonrheumatic aortic (valve) stenosis: Secondary | ICD-10-CM | POA: Insufficient documentation

## 2011-11-18 DIAGNOSIS — E785 Hyperlipidemia, unspecified: Secondary | ICD-10-CM | POA: Insufficient documentation

## 2011-11-18 NOTE — Patient Instructions (Signed)
Your physician recommends that you return for lab work in: this week  Your physician has recommended you make the following change in your medication: stop taking Imdur (Isosorbide)  Your physician recommends that you schedule a follow-up appointment in: 6 months

## 2011-11-18 NOTE — Assessment & Plan Note (Addendum)
Aortic stenosis was mild by echocardiography 18 months ago and by exam today.  It is unlikely that this will ever represent a significant problem for this elderly woman.

## 2011-11-18 NOTE — Assessment & Plan Note (Signed)
No lipid profile available for review.  One will be obtained.

## 2011-11-18 NOTE — Assessment & Plan Note (Signed)
Patient is conversant, pleasant and clearly in touch with what is happening around her.  Dementia is mild.

## 2011-11-18 NOTE — Assessment & Plan Note (Signed)
Hypertension is mild and under good control.  Current medications will be continued.

## 2011-11-18 NOTE — Assessment & Plan Note (Signed)
[name] has frequent episodes of chest discomfort without objective evidence for myocardial ischemia.  I discussed the patient's and family's wishes with respect to the extent of care.  We agreed that unless there is clear evidence for myocardial ischemia, conservative therapy will be appropriate.  Imdur is being provided in the subtherapeutic dose and will be discontinued.

## 2011-11-18 NOTE — Assessment & Plan Note (Signed)
Conservative care is also appropriate for cerebrovascular disease.  In the absence of significant neurologic findings, no further testing is anticipated.

## 2011-11-18 NOTE — Progress Notes (Signed)
Patient ID: [name], female   DOB: [birth year], 78 y.o.   MRN: [id number] HPI: Initial visit for this lovely nonagenarian referred by Dr. Fransico Him for management of coronary artery disease.  Patient has a long history of ischemic heart disease with multiple previous PCI's, most recently approximately 5 years ago at [facility].  Records have been requested and are pending.  She has had a chronic chest pain syndrome with multiple exacerbations, many of which have not apparently represented an acute coronary syndrome.  She also has gastroesophageal reflux disease with symptoms that she cannot distinguish from ischemic chest discomfort.  She has apparently never suffered an acute myocardial infarction nor been critically ill as a result of her heart disease.  She's not been told of a murmur in the past.  Current Outpatient Prescriptions on File Prior to Visit  Medication Sig Dispense Refill  . acetaminophen (TYLENOL) 500 MG tablet Take 500 mg by mouth every 6 (six) hours as needed. For pain       . ALPRAZolam (XANAX) 0.25 MG tablet Take 0.25 mg by mouth 2 (two) times daily as needed. For agitation and/or anxiety       . amLODipine-benazepril (LOTREL) 5-20 MG per capsule Take 1 capsule by mouth daily.        Marland Kitchen aspirin EC 81 MG tablet Take 81 mg by mouth daily.        . Calcium Carb-Cholecalciferol (OYSTER SHELL CALCIUM + D) 500-400 MG-UNIT TABS Take 1 tablet by mouth daily.        . clopidogrel (PLAVIX) 75 MG tablet Take 75 mg by mouth daily.        . fentaNYL (DURAGESIC - DOSED MCG/HR) 25 MCG/HR Place 1 patch onto the skin every 3 (three) days. Remove used patches before applying a new patch       . ferrous sulfate 325 (65 FE) MG tablet Take 325 mg by mouth 2 (two) times daily.        . folic acid-vitamin b complex-vitamin c-selenium-zinc (DIALYVITE) 3 MG TABS Take 1 tablet by mouth daily.        . furosemide (LASIX) 20 MG tablet Take 20 mg by mouth daily.        Marland Kitchen HYDROcodone-acetaminophen (VICODIN)  5-500 MG per tablet Take 1 tablet by mouth every 6 (six) hours as needed. For pain       . isosorbide mononitrate (IMDUR) 30 MG 24 hr tablet Take 30 mg by mouth daily.        Marland Kitchen loperamide (IMODIUM) 2 MG capsule Take 2 mg by mouth daily as needed. For Diarrhea       . metoprolol (TOPROL-XL) 50 MG 24 hr tablet Take 50 mg by mouth at bedtime.        . nitroGLYCERIN (NITROSTAT) 0.4 MG SL tablet Place 1 tablet (0.4 mg total) under the tongue every 5 (five) minutes as needed.  25 tablet  11  . pantoprazole (PROTONIX) 40 MG tablet Take 40 mg by mouth daily.        . potassium chloride SA (K-DUR,KLOR-CON) 20 MEQ tablet Take 20 mEq by mouth daily.        Marland Kitchen senna (SENOKOT) 8.6 MG TABS Take 2 tablets by mouth.        . simvastatin (ZOCOR) 40 MG tablet Take 40 mg by mouth at bedtime.        . Vitamin D, Ergocalciferol, (DRISDOL) 50000 UNITS CAPS Take 50,000 Units by mouth every 30 (thirty) days.        Marland Kitchen  zinc sulfate 220 MG capsule Take 220 mg by mouth daily.          Allergies  Allergen Reactions  . Codeine Other (See Comments)    unknown  . Penicillins Other (See Comments)    unknown  . Sulfa Antibiotics Other (See Comments)    unknown      Past Medical History  Diagnosis Date  . Carcinoma of breast 2008    Right mastectomy  . Paroxysmal atrial fibrillation   . Hypertension   . Anxiety   . Arteriosclerotic cardiovascular disease (ASCVD)     Multiple prior PCI, most recently in 2007  . Dementia   . Gastroesophageal reflux disease   . Gout   . Hip fracture, left 2009     Past Surgical History  Procedure Date  . Orif hip fracture 2009  . Cataract extraction   . Mastectomy 2008    Right     No family history on file.   History   Social History  . Marital Status: Widowed    Spouse Name: N/A    Number of Children: N/A  . Years of Education: N/A   Occupational History  . Retired    Social History Main Topics  . Smoking status: Never Smoker   . Smokeless tobacco: Never Used   . Alcohol Use: No  . Drug Use: No  . Sexually Active: Not Currently   Other Topics Concern  . Not on file   Social History Narrative   Resident of [facility]     ROS:      All other systems reviewed and are negative.  PHYSICAL EXAM: BP 124/71  Pulse 90  Resp 16  Ht 4\' 11"  (1.499 m)  Wt 52.617 kg (116 lb)  BMI 23.43 kg/m2  General-Slight, elderly woman, Well-developed; no acute distress HEENT-[place]/AT; PERRL; EOM intact; conjunctiva and lids nl Neck-No JVD; no carotid bruits; normal carotid upstrokes Endocrine-No thyromegaly Lungs-Clear lung fields; resonant percussion; normal I-to-E ratio; mild to moderate kyphosis; decreased breath sounds at the bases Cardiovascular- normal PMI; normal S1, decreased intensity of S2; grade 2-3/6 heart systolic ejection murmur at the cardiac base Abdomen-BS normal; firm and non-tender without masses or organomegaly Musculoskeletal-No deformities, cyanosis or clubbing Neurologic-Nl cranial nerves; symmetric strength and tone Skin- Warm, no significant lesions Extremities-Nl distal pulses; trace edema  EKG:  Tracing obtained 09/07/2011 reviewed.  Normal sinus rhythm with 1st degree AV block, left atrial abnormality, borderline IVCD, left anterior fascicular block, probable LVH, possible previous septal MI.  No previous tracing for comparison.   ASSESSMENT AND PLAN:  [facility], MD 11/18/2011 2:24 PM

## 2011-11-21 IMAGING — CR DG SHOULDER 2+V*L*
3 series · 3 of 3 positions shown · non-contrast
Comparison: None

CLINICAL DATA: Fall.  Shoulder pain.  Breast cancer.

LEFT SHOULDER - 2+ VIEW

[view not recorded (1 of 3)]
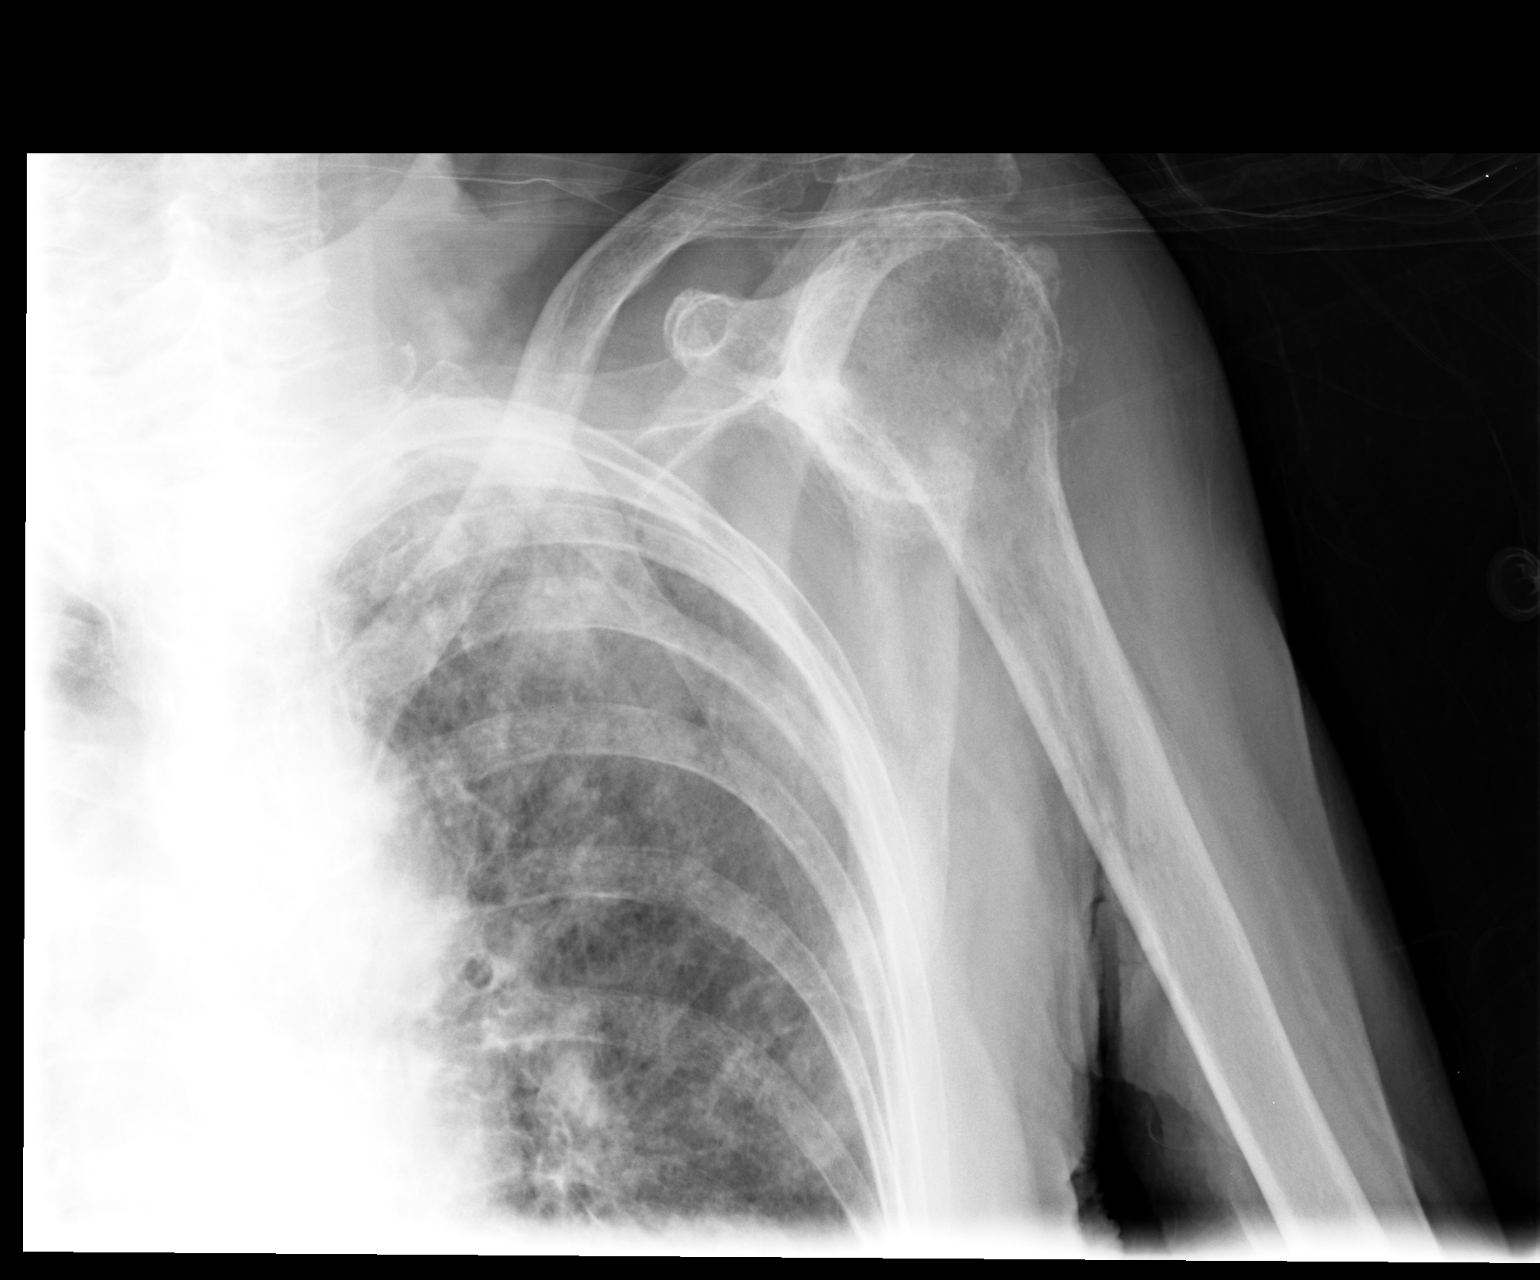

[view not recorded (2 of 3)]
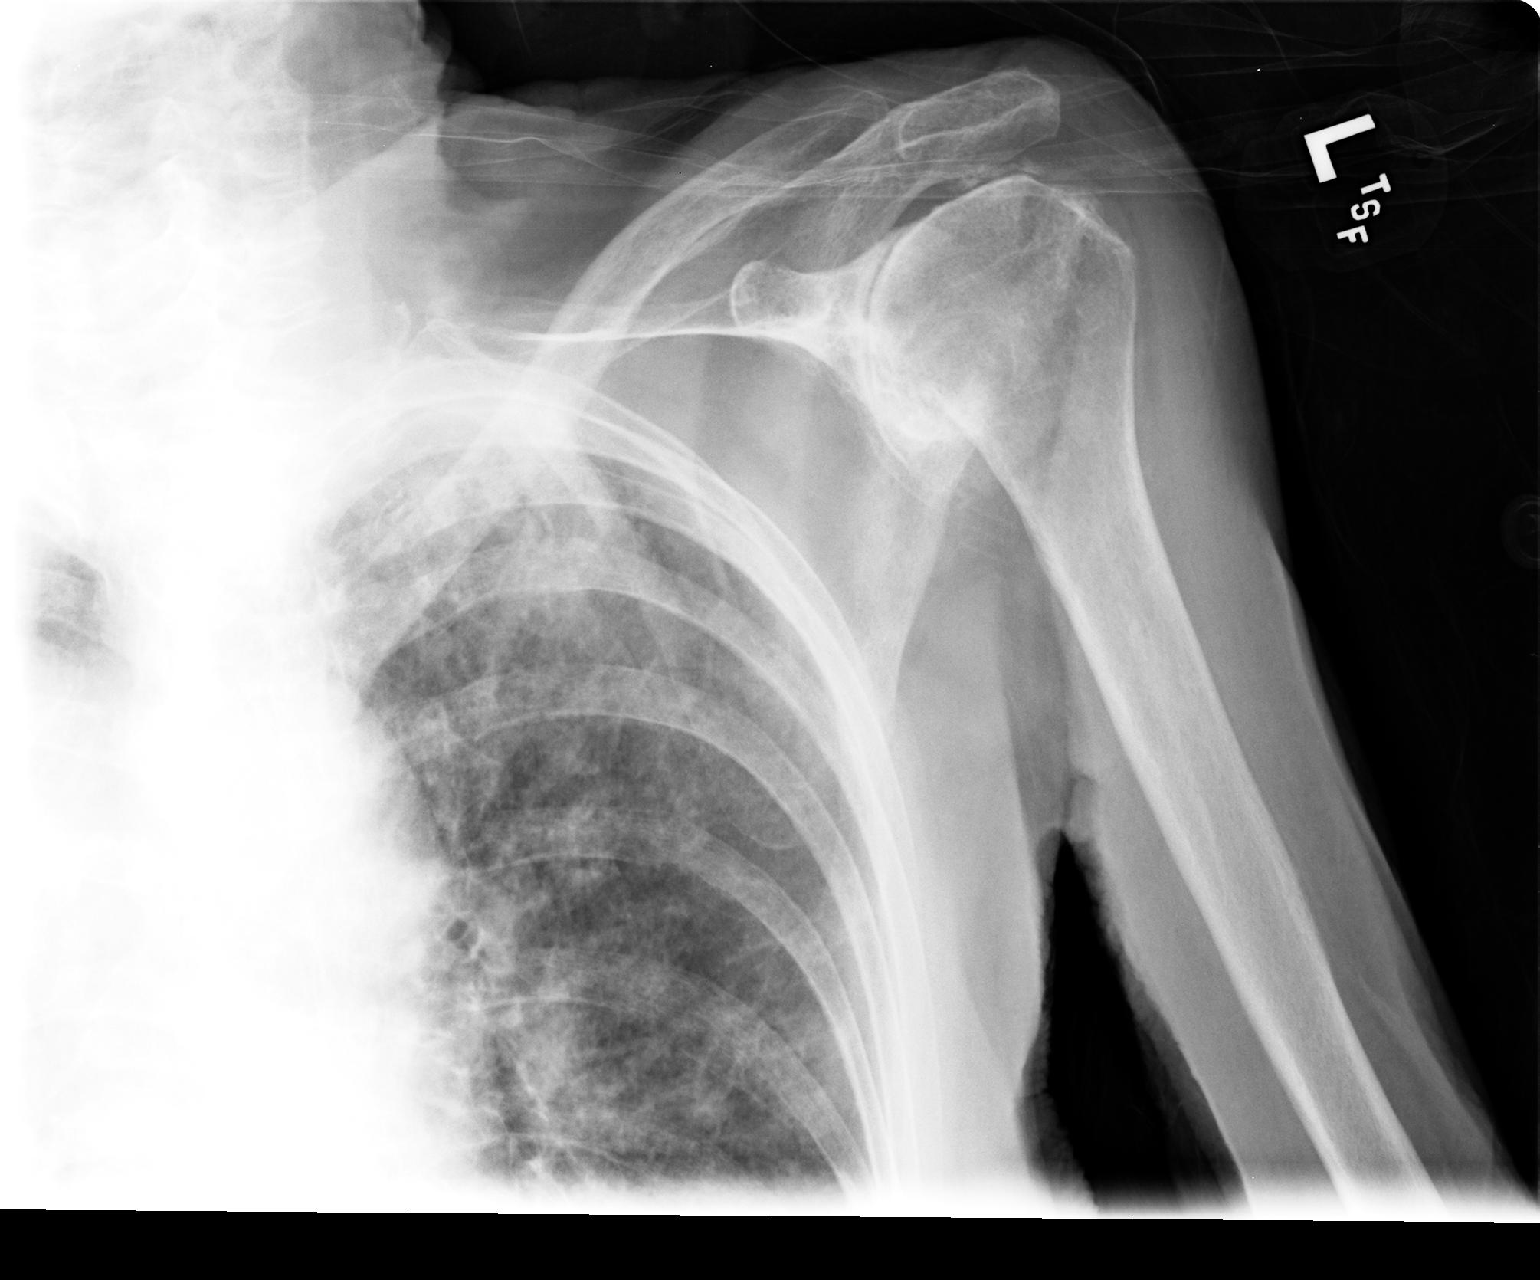

[view not recorded (3 of 3)]
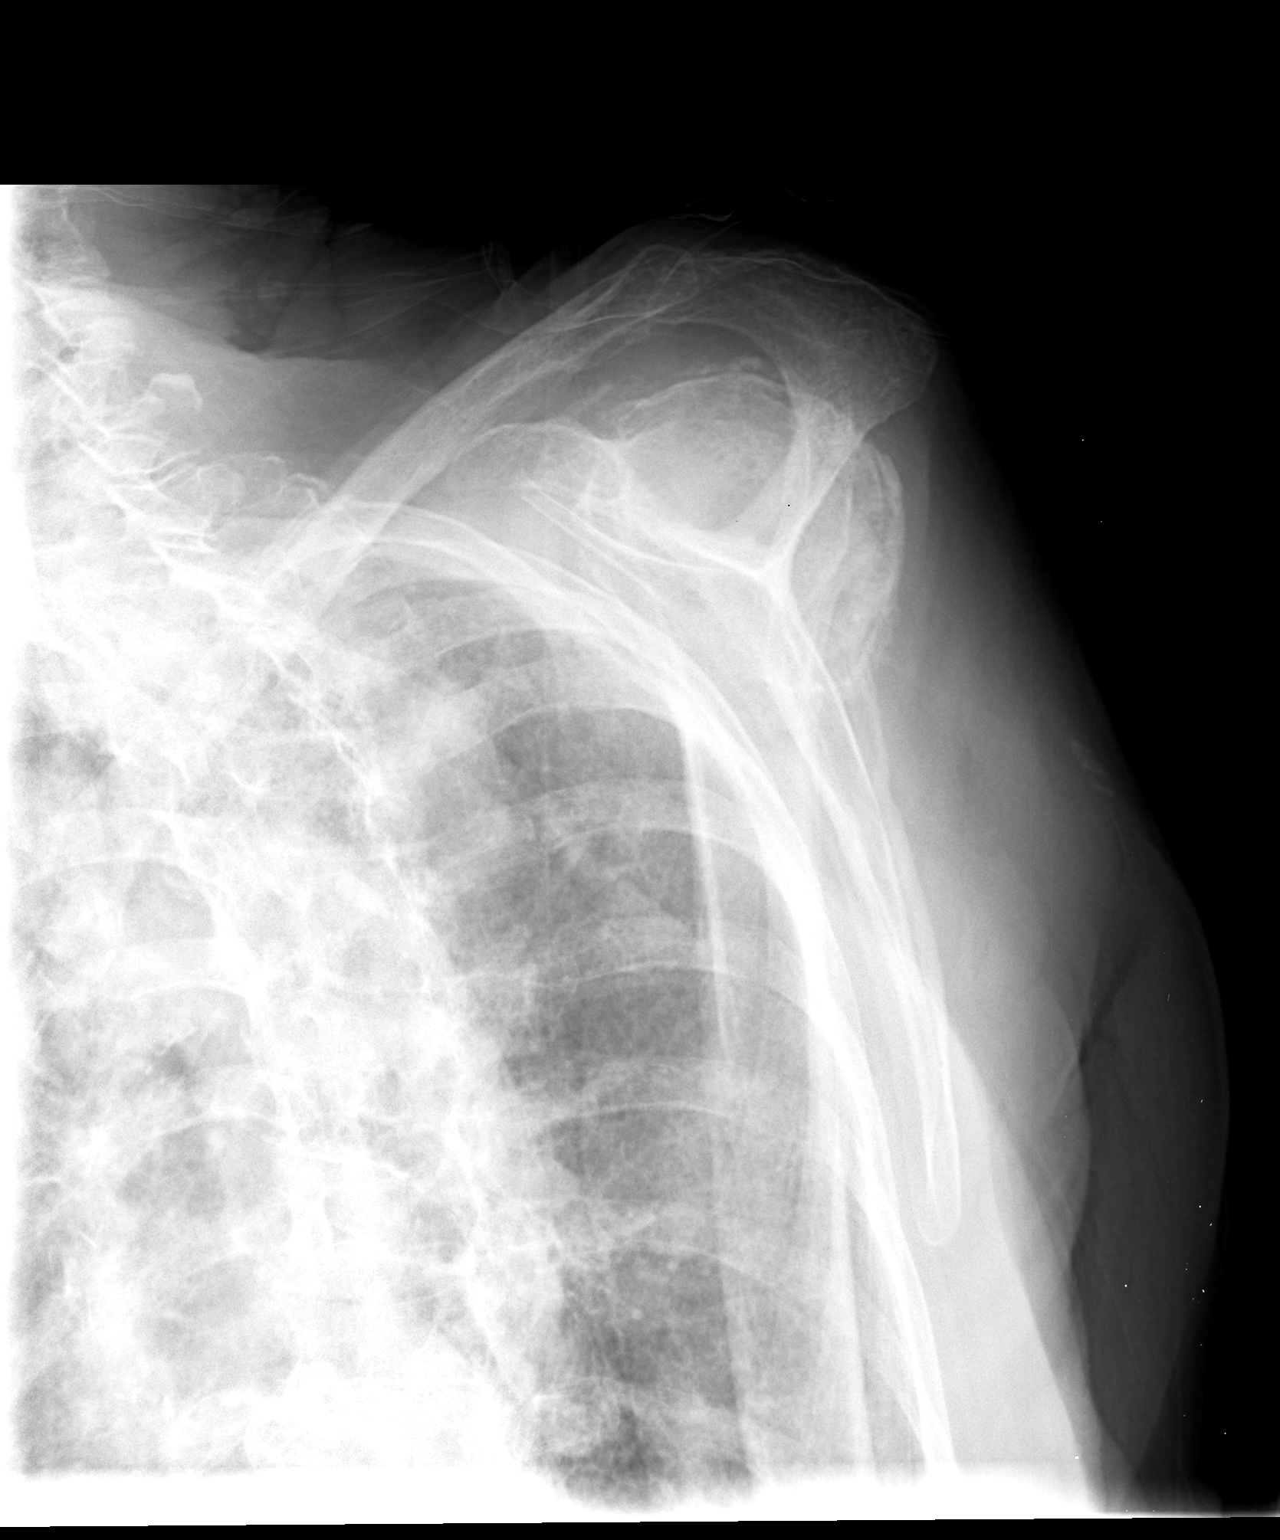

[3 of 3 positions shown; findings below may reference images not displayed]

FINDINGS: Advanced degenerative change in the glenohumeral joint.
There is calcific tendonitis in the rotator cuff.  There is mild
degenerative change in the AC joint.

Negative for fracture.
IMPRESSION: Negative for fracture.

Advanced degenerative change in the glenohumeral joint.  Calcific
tendonitis.

## 2011-11-29 ENCOUNTER — Encounter: Payer: Self-pay | Admitting: *Deleted

## 2012-01-20 ENCOUNTER — Inpatient Hospital Stay (HOSPITAL_COMMUNITY)
Admission: EM | Admit: 2012-01-20 | Discharge: 2012-01-24 | DRG: 392 | Disposition: A | Discharge status: Skilled Nursing Facility | Payer: Medicare Other | Attending: Internal Medicine | Admitting: Internal Medicine

## 2012-01-20 ENCOUNTER — Inpatient Hospital Stay (HOSPITAL_COMMUNITY): Payer: Medicare Other

## 2012-01-20 ENCOUNTER — Other Ambulatory Visit: Payer: Self-pay

## 2012-01-20 ENCOUNTER — Emergency Department (HOSPITAL_COMMUNITY): Payer: Medicare Other

## 2012-01-20 ENCOUNTER — Encounter (HOSPITAL_COMMUNITY): Payer: Self-pay | Admitting: *Deleted

## 2012-01-20 DIAGNOSIS — R000 Tachycardia, unspecified: Secondary | ICD-10-CM | POA: Diagnosis present

## 2012-01-20 DIAGNOSIS — K219 Gastro-esophageal reflux disease without esophagitis: Secondary | ICD-10-CM | POA: Diagnosis present

## 2012-01-20 DIAGNOSIS — A088 Other specified intestinal infections: Principal | ICD-10-CM | POA: Diagnosis present

## 2012-01-20 DIAGNOSIS — Z7982 Long term (current) use of aspirin: Secondary | ICD-10-CM

## 2012-01-20 DIAGNOSIS — E785 Hyperlipidemia, unspecified: Secondary | ICD-10-CM | POA: Diagnosis present

## 2012-01-20 DIAGNOSIS — E860 Dehydration: Secondary | ICD-10-CM | POA: Diagnosis present

## 2012-01-20 DIAGNOSIS — IMO0002 Reserved for concepts with insufficient information to code with codable children: Secondary | ICD-10-CM

## 2012-01-20 DIAGNOSIS — K529 Noninfective gastroenteritis and colitis, unspecified: Secondary | ICD-10-CM | POA: Diagnosis present

## 2012-01-20 DIAGNOSIS — F341 Dysthymic disorder: Secondary | ICD-10-CM | POA: Diagnosis present

## 2012-01-20 DIAGNOSIS — G894 Chronic pain syndrome: Secondary | ICD-10-CM | POA: Diagnosis present

## 2012-01-20 DIAGNOSIS — I10 Essential (primary) hypertension: Secondary | ICD-10-CM | POA: Diagnosis present

## 2012-01-20 DIAGNOSIS — E46 Unspecified protein-calorie malnutrition: Secondary | ICD-10-CM | POA: Diagnosis present

## 2012-01-20 DIAGNOSIS — R5381 Other malaise: Secondary | ICD-10-CM | POA: Diagnosis present

## 2012-01-20 DIAGNOSIS — Z7902 Long term (current) use of antithrombotics/antiplatelets: Secondary | ICD-10-CM

## 2012-01-20 DIAGNOSIS — F0390 Unspecified dementia without behavioral disturbance: Secondary | ICD-10-CM | POA: Diagnosis present

## 2012-01-20 DIAGNOSIS — I2510 Atherosclerotic heart disease of native coronary artery without angina pectoris: Secondary | ICD-10-CM | POA: Diagnosis present

## 2012-01-20 DIAGNOSIS — R112 Nausea with vomiting, unspecified: Secondary | ICD-10-CM

## 2012-01-20 LAB — CBC
MCHC: 34.2 g/dL (ref 30.0–36.0)
Platelets: 186 10*3/uL (ref 150–400)
RDW: 13.9 % (ref 11.5–15.5)

## 2012-01-20 LAB — HEPATIC FUNCTION PANEL
ALT: 16 U/L (ref 0–35)
AST: 21 U/L (ref 0–37)
Alkaline Phosphatase: 55 U/L (ref 39–117)
Bilirubin, Direct: 0.1 mg/dL (ref 0.0–0.3)
Indirect Bilirubin: 0.4 mg/dL (ref 0.3–0.9)

## 2012-01-20 LAB — URINALYSIS, ROUTINE W REFLEX MICROSCOPIC
Bilirubin Urine: NEGATIVE
Glucose, UA: NEGATIVE mg/dL
Hgb urine dipstick: NEGATIVE
Nitrite: NEGATIVE
Specific Gravity, Urine: 1.025 (ref 1.005–1.030)
pH: 5.5 (ref 5.0–8.0)

## 2012-01-20 LAB — BASIC METABOLIC PANEL
BUN: 24 mg/dL — ABNORMAL HIGH (ref 6–23)
GFR calc Af Amer: 54 mL/min — ABNORMAL LOW (ref >90)
GFR calc non Af Amer: 46 mL/min — ABNORMAL LOW (ref >90)
Potassium: 3.4 mEq/L — ABNORMAL LOW (ref 3.5–5.1)
Sodium: 138 mEq/L (ref 135–145)

## 2012-01-20 MED ORDER — SIMVASTATIN 20 MG PO TABS
40.0000 mg | ORAL_TABLET | Freq: Every day | ORAL | Status: DC
  Administered 2012-01-20: 40 mg via ORAL
  Filled 2012-01-20: qty 2

## 2012-01-20 MED ORDER — VITAMIN D (ERGOCALCIFEROL) 1.25 MG (50000 UNIT) PO CAPS
50000.0000 [IU] | ORAL_CAPSULE | ORAL | Status: DC
  Filled 2012-01-20: qty 1

## 2012-01-20 MED ORDER — SODIUM CHLORIDE 0.9 % IJ SOLN
INTRAMUSCULAR | Status: AC
  Administered 2012-01-20: 10 mL
  Filled 2012-01-20: qty 3

## 2012-01-20 MED ORDER — ONDANSETRON HCL 4 MG/2ML IJ SOLN: 4.0000 mg | Freq: Four times a day (QID) | INTRAMUSCULAR | Status: DC | PRN

## 2012-01-20 MED ORDER — FUROSEMIDE 20 MG PO TABS
20.0000 mg | ORAL_TABLET | Freq: Every day | ORAL | Status: DC
  Administered 2012-01-20 – 2012-01-24 (×5): 20 mg via ORAL
  Filled 2012-01-20 (×5): qty 1

## 2012-01-20 MED ORDER — ENOXAPARIN SODIUM 30 MG/0.3ML ~~LOC~~ SOLN
30.0000 mg | SUBCUTANEOUS | Status: DC
  Administered 2012-01-20 – 2012-01-24 (×5): 30 mg via SUBCUTANEOUS
  Filled 2012-01-20 (×5): qty 0.3

## 2012-01-20 MED ORDER — BENAZEPRIL HCL 10 MG PO TABS
20.0000 mg | ORAL_TABLET | Freq: Every day | ORAL | Status: DC
  Administered 2012-01-20 – 2012-01-24 (×5): 20 mg via ORAL
  Filled 2012-01-20 (×3): qty 2
  Filled 2012-01-20 (×2): qty 1
  Filled 2012-01-20: qty 2

## 2012-01-20 MED ORDER — ONDANSETRON HCL 4 MG/2ML IJ SOLN: 4.0000 mg | Freq: Once | INTRAMUSCULAR | Status: DC

## 2012-01-20 MED ORDER — AMLODIPINE BESYLATE 5 MG PO TABS
5.0000 mg | ORAL_TABLET | Freq: Every day | ORAL | Status: DC
  Administered 2012-01-20 – 2012-01-24 (×5): 5 mg via ORAL
  Filled 2012-01-20 (×5): qty 1

## 2012-01-20 MED ORDER — ONDANSETRON HCL 4 MG/2ML IJ SOLN
4.0000 mg | Freq: Once | INTRAMUSCULAR | Status: AC
  Administered 2012-01-20: 4 mg via INTRAVENOUS

## 2012-01-20 MED ORDER — ENOXAPARIN SODIUM 40 MG/0.4ML ~~LOC~~ SOLN: 40.0000 mg | SUBCUTANEOUS | Status: DC

## 2012-01-20 MED ORDER — NEPHRO-VITE 0.8 MG PO TABS
1.0000 | ORAL_TABLET | Freq: Every day | ORAL | Status: DC
  Administered 2012-01-20 – 2012-01-24 (×5): 1 via ORAL
  Filled 2012-01-20 (×5): qty 1

## 2012-01-20 MED ORDER — DEXTROSE-NACL 5-0.9 % IV SOLN
INTRAVENOUS | Status: DC
  Administered 2012-01-20 – 2012-01-22 (×3): via INTRAVENOUS

## 2012-01-20 MED ORDER — METOPROLOL SUCCINATE ER 50 MG PO TB24
50.0000 mg | ORAL_TABLET | Freq: Every day | ORAL | Status: DC
  Administered 2012-01-20 – 2012-01-23 (×4): 50 mg via ORAL
  Filled 2012-01-20 (×5): qty 1

## 2012-01-20 MED ORDER — LOPERAMIDE HCL 2 MG PO CAPS
2.0000 mg | ORAL_CAPSULE | Freq: Every day | ORAL | Status: DC | PRN
  Administered 2012-01-24: 2 mg via ORAL
  Filled 2012-01-20: qty 1

## 2012-01-20 MED ORDER — FENTANYL 25 MCG/HR TD PT72
25.0000 ug | MEDICATED_PATCH | TRANSDERMAL | Status: DC
  Administered 2012-01-20 – 2012-01-23 (×2): 25 ug via TRANSDERMAL
  Filled 2012-01-20 (×3): qty 1

## 2012-01-20 MED ORDER — HYDROCODONE-ACETAMINOPHEN 5-325 MG PO TABS
1.0000 | ORAL_TABLET | Freq: Four times a day (QID) | ORAL | Status: DC | PRN
  Administered 2012-01-21 – 2012-01-24 (×6): 1 via ORAL
  Filled 2012-01-20 (×6): qty 1

## 2012-01-20 MED ORDER — ASPIRIN EC 81 MG PO TBEC
81.0000 mg | DELAYED_RELEASE_TABLET | Freq: Every day | ORAL | Status: DC
  Administered 2012-01-20 – 2012-01-24 (×5): 81 mg via ORAL
  Filled 2012-01-20 (×5): qty 1

## 2012-01-20 MED ORDER — DIALYVITE 3000 3 MG PO TABS: 1.0000 | ORAL_TABLET | Freq: Every day | ORAL | Status: DC

## 2012-01-20 MED ORDER — CLOPIDOGREL BISULFATE 75 MG PO TABS
75.0000 mg | ORAL_TABLET | Freq: Every day | ORAL | Status: DC
  Administered 2012-01-20 – 2012-01-24 (×5): 75 mg via ORAL
  Filled 2012-01-20 (×5): qty 1

## 2012-01-20 MED ORDER — ONDANSETRON HCL 4 MG/2ML IJ SOLN
4.0000 mg | Freq: Three times a day (TID) | INTRAMUSCULAR | Status: DC
  Administered 2012-01-20: 4 mg via INTRAVENOUS
  Filled 2012-01-20 (×3): qty 2

## 2012-01-20 MED ORDER — AMLODIPINE BESY-BENAZEPRIL HCL 5-20 MG PO CAPS: 1.0000 | ORAL_CAPSULE | Freq: Every day | ORAL | Status: DC

## 2012-01-20 MED ORDER — ONDANSETRON HCL 4 MG/2ML IJ SOLN
4.0000 mg | Freq: Once | INTRAMUSCULAR | Status: AC
  Administered 2012-01-20: 4 mg via INTRAVENOUS
  Filled 2012-01-20: qty 2

## 2012-01-20 MED ORDER — NITROGLYCERIN 0.4 MG SL SUBL: 0.4000 mg | SUBLINGUAL_TABLET | SUBLINGUAL | Status: DC | PRN

## 2012-01-20 MED ORDER — SODIUM CHLORIDE 0.9 % IV BOLUS (SEPSIS)
500.0000 mL | Freq: Once | INTRAVENOUS | Status: AC
  Administered 2012-01-20: 500 mL via INTRAVENOUS

## 2012-01-20 MED ORDER — ACETAMINOPHEN 500 MG PO TABS: 500.0000 mg | ORAL_TABLET | Freq: Four times a day (QID) | ORAL | Status: DC | PRN

## 2012-01-20 MED ORDER — CALCIUM CARBONATE-VITAMIN D 500-200 MG-UNIT PO TABS
1.0000 | ORAL_TABLET | Freq: Every day | ORAL | Status: DC
  Administered 2012-01-20 – 2012-01-24 (×5): 1 via ORAL
  Filled 2012-01-20 (×5): qty 1

## 2012-01-20 MED ORDER — PANTOPRAZOLE SODIUM 40 MG IV SOLR
40.0000 mg | INTRAVENOUS | Status: DC
  Administered 2012-01-20: 40 mg via INTRAVENOUS
  Filled 2012-01-20: qty 40

## 2012-01-20 MED ORDER — ONDANSETRON HCL 4 MG/2ML IJ SOLN
INTRAMUSCULAR | Status: AC
  Filled 2012-01-20: qty 2

## 2012-01-20 MED ORDER — SODIUM CHLORIDE 0.9 % IV SOLN: INTRAVENOUS | Status: AC

## 2012-01-20 MED ORDER — HYDROCODONE-ACETAMINOPHEN 5-325 MG PO TABS
1.0000 | ORAL_TABLET | Freq: Once | ORAL | Status: AC
  Administered 2012-01-20: 1 via ORAL
  Filled 2012-01-20: qty 1

## 2012-01-20 MED ORDER — ZINC SULFATE 220 (50 ZN) MG PO CAPS
220.0000 mg | ORAL_CAPSULE | Freq: Every day | ORAL | Status: DC
  Administered 2012-01-20 – 2012-01-24 (×5): 220 mg via ORAL
  Filled 2012-01-20 (×6): qty 1

## 2012-01-20 MED ORDER — POTASSIUM CHLORIDE CRYS ER 20 MEQ PO TBCR
20.0000 meq | EXTENDED_RELEASE_TABLET | Freq: Every day | ORAL | Status: DC
  Administered 2012-01-20 – 2012-01-24 (×5): 20 meq via ORAL
  Filled 2012-01-20 (×5): qty 1

## 2012-01-20 MED ORDER — SENNA 8.6 MG PO TABS
2.0000 | ORAL_TABLET | Freq: Every day | ORAL | Status: DC
  Administered 2012-01-20: 17.2 mg via ORAL
  Filled 2012-01-20: qty 1

## 2012-01-20 MED ORDER — FERROUS SULFATE 325 (65 FE) MG PO TABS
325.0000 mg | ORAL_TABLET | Freq: Two times a day (BID) | ORAL | Status: DC
  Administered 2012-01-20: 325 mg via ORAL
  Filled 2012-01-20: qty 1

## 2012-01-20 MED ORDER — PANTOPRAZOLE SODIUM 40 MG IV SOLR
40.0000 mg | Freq: Two times a day (BID) | INTRAVENOUS | Status: DC
  Administered 2012-01-21 – 2012-01-24 (×6): 40 mg via INTRAVENOUS
  Filled 2012-01-20 (×6): qty 40

## 2012-01-20 MED ORDER — ALPRAZOLAM 0.25 MG PO TABS
0.2500 mg | ORAL_TABLET | Freq: Two times a day (BID) | ORAL | Status: DC | PRN
  Administered 2012-01-21 – 2012-01-24 (×5): 0.25 mg via ORAL
  Filled 2012-01-20 (×5): qty 1

## 2012-01-20 NOTE — ED Provider Notes (Signed)
History     CSN: [id number]  Arrival date & time 01/20/12  0158   First MD Initiated Contact with Patient 01/20/12 [phone]      Chief Complaint  Patient presents with  . Emesis    (Consider location/radiation/quality/duration/timing/severity/associated sxs/prior treatment) HPI Comments: Elderly 78 year old female with a history of dementia, hyperlipidemia, hypertension, paroxysmal atrial fibrillation, breast cancer and remote history of coronary disease. She presents with a complaint of vomiting. According to the nursing home staff this occurred at approximately 10:00 last night, has vomited several times including at the nursing facility, in the ambulance and on arrival. There has been no history of diarrhea. Her roommate at the nursing home was also transported this evening with nausea vomiting diarrhea. The patient states "I'm sick". She denies chest pain but does admit to having a cough.  The history is provided by the patient, medical records, the EMS personnel and the nursing home. History Limited By: dementia.    Past Medical History  Diagnosis Date  . Carcinoma of breast 2008    Right mastectomy  . Paroxysmal atrial fibrillation   . Hypertension   . Anxiety   . Arteriosclerotic cardiovascular disease (ASCVD)     Multiple prior PCI, most recently in 2007  . Dementia   . Gastroesophageal reflux disease   . Gout   . Hip fracture, left 2011    Past Surgical History  Procedure Date  . Orif hip fracture 2011  . Cataract extraction   . Mastectomy 2008    Right    No family history on file.  History  Substance Use Topics  . Smoking status: Never Smoker   . Smokeless tobacco: Never Used  . Alcohol Use: No    OB History    Grav Para Term Preterm Abortions TAB SAB Ect Mult Living                  Review of Systems  Unable to perform ROS: Dementia    Allergies  Codeine; Penicillins; and Sulfa antibiotics  Home Medications   Current Outpatient Rx  Name Route  Sig Dispense Refill  . ACETAMINOPHEN 500 MG PO TABS Oral Take 500 mg by mouth every 6 (six) hours as needed. For pain     . ALPRAZOLAM 0.25 MG PO TABS Oral Take 0.25 mg by mouth 2 (two) times daily as needed. For agitation and/or anxiety     . AMLODIPINE BESY-BENAZEPRIL HCL 5-20 MG PO CAPS Oral Take 1 capsule by mouth daily.      . ASPIRIN EC 81 MG PO TBEC Oral Take 81 mg by mouth daily.      Marland Kitchen CALCIUM CARB-CHOLECALCIFEROL 500-400 MG-UNIT PO TABS Oral Take 1 tablet by mouth daily.      Marland Kitchen CLOPIDOGREL BISULFATE 75 MG PO TABS Oral Take 75 mg by mouth daily.      . FENTANYL 25 MCG/HR TD PT72 Transdermal Place 1 patch onto the skin every 3 (three) days. Remove used patches before applying a new patch     . FERROUS SULFATE 325 (65 FE) MG PO TABS Oral Take 325 mg by mouth 2 (two) times daily.      Marland Kitchen DIALYVITE 3000 3 MG PO TABS Oral Take 1 tablet by mouth daily.      . FUROSEMIDE 20 MG PO TABS Oral Take 20 mg by mouth daily.      Marland Kitchen HYDROCODONE-ACETAMINOPHEN 5-500 MG PO TABS Oral Take 1 tablet by mouth every 6 (six) hours as needed. For  pain     . LOPERAMIDE HCL 2 MG PO CAPS Oral Take 2 mg by mouth daily as needed. For Diarrhea     . METOPROLOL SUCCINATE ER 50 MG PO TB24 Oral Take 50 mg by mouth at bedtime.      Marland Kitchen NITROGLYCERIN 0.4 MG SL SUBL Sublingual Place 1 tablet (0.4 mg total) under the tongue every 5 (five) minutes as needed. 25 tablet 11  . PANTOPRAZOLE SODIUM 40 MG PO TBEC Oral Take 40 mg by mouth daily.      Marland Kitchen POTASSIUM CHLORIDE CRYS ER 20 MEQ PO TBCR Oral Take 20 mEq by mouth daily.      . SENNA 8.6 MG PO TABS Oral Take 2 tablets by mouth.      Marland Kitchen SIMVASTATIN 40 MG PO TABS Oral Take 40 mg by mouth at bedtime.      Marland Kitchen VITAMIN D (ERGOCALCIFEROL) 50000 UNITS PO CAPS Oral Take 50,000 Units by mouth every 30 (thirty) days.      Marland Kitchen ZINC SULFATE 220 MG PO CAPS Oral Take 220 mg by mouth daily.        BP 155/84  Pulse 103  Temp(Src) 98.2 F (36.8 C) (Oral)  Resp 22  Wt 116 lb (52.617 kg)  SpO2  90%  Physical Exam  Nursing note and vitals reviewed. Constitutional: She appears well-developed and well-nourished. No distress.  HENT:  Head: Normocephalic and atraumatic.  Mouth/Throat: No oropharyngeal exudate.       Mucous membranes dry  Eyes: Conjunctivae and EOM are normal. Pupils are equal, round, and reactive to light. Right eye exhibits no discharge. Left eye exhibits no discharge. No scleral icterus.  Neck: Normal range of motion. Neck supple. No JVD present. No thyromegaly present.  Cardiovascular: Normal rate, regular rhythm, normal heart sounds and intact distal pulses.  Exam reveals no gallop and no friction rub.   No murmur heard. Pulmonary/Chest: Effort normal and breath sounds normal. No respiratory distress. She has no wheezes. She has no rales.  Abdominal: Soft. Bowel sounds are normal. She exhibits no distension and no mass. There is no tenderness.  Musculoskeletal: Normal range of motion. She exhibits no edema and no tenderness.  Lymphadenopathy:    She has no cervical adenopathy.  Neurological: She is alert. Coordination normal.  Skin: Skin is warm and dry. No rash noted. No erythema.  Psychiatric: She has a normal mood and affect. Her behavior is normal.    ED Course  Procedures (including critical care time)  ED ECG REPORT   Date: 01/20/2012 0255 AM  Rate: 96  Rhythm: normal sinus rhythm  QRS Axis: indeterminate  Intervals: PR prolonged  ST/T Wave abnormalities: nonspecific T wave changes  Conduction Disutrbances:first-degree A-V block   Narrative Interpretation:   Old EKG Reviewed: Arm lead reversal since last EKG  ED ECG REPORT   Date: 01/20/2012 0435 AM  Rate: 110  Rhythm: sinus tachycardia  QRS Axis: left  Intervals: PR prolonged  ST/T Wave abnormalities: Repolarization abnormality  Conduction Disutrbances:first-degree A-V block   Narrative Interpretation:   Old EKG Reviewed: Since EKG from 09/07/2011, no significant changes seen other than  increased rate. Poor R wave progression persists     Labs Reviewed  CBC  BASIC METABOLIC PANEL  TROPONIN I  URINALYSIS, ROUTINE W REFLEX MICROSCOPIC   No results found.   No diagnosis found.    MDM  Patient has a normal physical exam other than appearing dehydrated. She has nausea at this time, Zofran to be  given I intravenous route, IV fluids, rule out other sources of nausea. IV placed by myself, see note below  Procedure Note :  Peripheral IV placement  I have personally placed an 22  gauge IV angiocath in the L hand  blood draw and saline flush successful and sterile dressing applied.  Pt tolerated without complaint or complication  Patient has had chest x-ray showing no signs of acute findings, blood work showing normal metabolic panel except for elevated BUN at 24, white blood cell count of 13,600 which is abnormally high. Her urinalysis was clear but concentrated and despite getting IV fluids and IV antiemetics she has persistent vomiting and ill feeling. We'll admit to the hospital for further workup. Troponin negative  Discussed with hospitalist who will admit         Vida Roller, MD 01/21/12 [phone]

## 2012-01-20 NOTE — ED Notes (Signed)
Pt from [facility]. Started vomiting around 2000 last night. Was given zofran at nursing home but pt vomited it up.

## 2012-01-20 NOTE — ED Notes (Signed)
Rachal called a [facility] & advised pt is going to be admitted.

## 2012-01-20 NOTE — Consult Note (Signed)
Referring Provider: No ref. provider found Primary Care Physician:  Avon Gully, MD, MD Primary Gastroenterologist:  Jonette Eva  Reason for Consultation:  NAUSEA AND VOMITING  HPI:   78 YO FEMALE ADMITTED WITH ACUTE ONSET OF NAUSEA AND VOMITING. Pt states she had watery stools last week. Had dark stools but not black stools. No problems swallowing. Severe vomiting preceded by right sided abdominal pain. Denies heartburn or indigestion. No rectal bleeding.    Past Medical History  Diagnosis Date  . Carcinoma of breast 2008    Right mastectomy  . Paroxysmal atrial fibrillation   . Hypertension   . Anxiety   . Arteriosclerotic cardiovascular disease (ASCVD)     Multiple prior PCI, most recently in 2007  . Dementia   . Gastroesophageal reflux disease   . Gout   . Hip fracture, left 2011  . Chest pain     Past Surgical History  Procedure Date  . Orif hip fracture 2011  . Cataract extraction   . Mastectomy 2008    Right    Prior to Admission medications   Medication Sig Start Date End Date Taking? Authorizing Provider  acetaminophen (TYLENOL) 500 MG tablet Take 500 mg by mouth every 6 (six) hours as needed. For pain    Yes Historical Provider, MD  ALPRAZolam (XANAX) 0.25 MG tablet Take 0.25 mg by mouth 2 (two) times daily as needed. For agitation and/or anxiety    Yes Historical Provider, MD  amLODipine-benazepril (LOTREL) 5-20 MG per capsule Take 1 capsule by mouth daily.     Yes Historical Provider, MD  aspirin EC 81 MG tablet Take 81 mg by mouth daily.     Yes Historical Provider, MD  Calcium Carb-Cholecalciferol (OYSTER SHELL CALCIUM + D) 500-400 MG-UNIT TABS Take 1 tablet by mouth daily.     Yes Historical Provider, MD  clopidogrel (PLAVIX) 75 MG tablet Take 75 mg by mouth daily.     Yes Historical Provider, MD  fentaNYL (DURAGESIC - DOSED MCG/HR) 25 MCG/HR Place 1 patch onto the skin every 3 (three) days. Remove used patches before applying a new patch    Yes  Historical Provider, MD  ferrous sulfate 325 (65 FE) MG tablet Take 325 mg by mouth 2 (two) times daily.     Yes Historical Provider, MD  folic acid-vitamin b complex-vitamin c-selenium-zinc (DIALYVITE) 3 MG TABS Take 1 tablet by mouth daily.     Yes Historical Provider, MD  furosemide (LASIX) 20 MG tablet Take 20 mg by mouth daily.     Yes Historical Provider, MD  HYDROcodone-acetaminophen (VICODIN) 5-500 MG per tablet Take 1 tablet by mouth every 6 (six) hours as needed. For pain    Yes Historical Provider, MD  loperamide (IMODIUM) 2 MG capsule Take 2 mg by mouth daily as needed. For Diarrhea    Yes Historical Provider, MD  metoprolol (TOPROL-XL) 50 MG 24 hr tablet Take 50 mg by mouth at bedtime.     Yes Historical Provider, MD  nitroGLYCERIN (NITROSTAT) 0.4 MG SL tablet Place 1 tablet (0.4 mg total) under the tongue every 5 (five) minutes as needed. 09/12/11  Yes Vesta Mixer, MD  ondansetron (ZOFRAN) 4 MG tablet Take 4 mg by mouth every 6 (six) hours as needed.   Yes Historical Provider, MD  pantoprazole (PROTONIX) 40 MG tablet Take 40 mg by mouth daily.     Yes Historical Provider, MD  potassium chloride SA (K-DUR,KLOR-CON) 20 MEQ tablet Take 20 mEq by mouth daily.  Yes Historical Provider, MD  senna (SENOKOT) 8.6 MG TABS Take 2 tablets by mouth.     Yes Historical Provider, MD  simvastatin (ZOCOR) 40 MG tablet Take 40 mg by mouth at bedtime.     Yes Historical Provider, MD  Vitamin D, Ergocalciferol, (DRISDOL) 50000 UNITS CAPS Take 50,000 Units by mouth every 30 (thirty) days.     Yes Historical Provider, MD  zinc sulfate 220 MG capsule Take 220 mg by mouth daily.     Yes Historical Provider, MD    Current Facility-Administered Medications  Medication Dose Route Frequency Provider Last Rate Last Dose  . acetaminophen (TYLENOL) tablet 500 mg  500 mg Oral Q6H PRN Avon Gully, MD      . ALPRAZolam Prudy Feeler) tablet 0.25 mg  0.25 mg Oral BID PRN Avon Gully, MD      . amLODipine  (NORVASC) tablet 5 mg  5 mg Oral Daily Avon Gully, MD   5 mg at 01/20/12 1231   And  . benazepril (LOTENSIN) tablet 20 mg  20 mg Oral Daily Avon Gully, MD   20 mg at 01/20/12 1231  . aspirin EC tablet 81 mg  81 mg Oral Daily Avon Gully, MD   81 mg at 01/20/12 1231  . b complex-vitamin c-folic acid (NEPHRO-VITE) tablet 1 tablet  1 tablet Oral Daily Avon Gully, MD   1 tablet at 01/20/12 1231  . calcium-vitamin D (OSCAL WITH D) 500-200 MG-UNIT per tablet 1 tablet  1 tablet Oral Daily Avon Gully, MD   1 tablet at 01/20/12 1232  . clopidogrel (PLAVIX) tablet 75 mg  75 mg Oral Daily Avon Gully, MD   75 mg at 01/20/12 1231  . dextrose 5 %-0.9 % sodium chloride infusion   Intravenous Continuous Avon Gully, MD 75 mL/hr at 01/20/12 1241    . enoxaparin (LOVENOX) injection 30 mg  30 mg Subcutaneous Q24H Avon Gully, MD   30 mg at 01/20/12 1233  . fentaNYL (DURAGESIC - dosed mcg/hr) 25 mcg  25 mcg Transdermal Q72H Avon Gully, MD   25 mcg at 01/20/12 1232  . ferrous sulfate tablet 325 mg  325 mg Oral BID Avon Gully, MD   325 mg at 01/20/12 1232  . furosemide (LASIX) tablet 20 mg  20 mg Oral Daily Avon Gully, MD   20 mg at 01/20/12 1231  . HYDROcodone-acetaminophen (NORCO) 5-325 MG per tablet 1 tablet  1 tablet Oral Once Vida Roller, MD   1 tablet at 01/20/12 0323  . HYDROcodone-acetaminophen (NORCO) 5-325 MG per tablet 1 tablet  1 tablet Oral Q6H PRN Avon Gully, MD      . loperamide (IMODIUM) capsule 2 mg  2 mg Oral Daily PRN Avon Gully, MD      . metoprolol succinate (TOPROL-XL) 24 hr tablet 50 mg  50 mg Oral QHS Avon Gully, MD      . nitroGLYCERIN (NITROSTAT) SL tablet 0.4 mg  0.4 mg Sublingual Q5 min PRN Avon Gully, MD      . ondansetron (ZOFRAN) injection 4 mg  4 mg Intravenous Once Vida Roller, MD   4 mg at 01/20/12 0245  . ondansetron (ZOFRAN) injection 4 mg  4 mg Intravenous Once Vida Roller, MD   4 mg at 01/20/12 [phone]  . pantoprazole (PROTONIX)  injection 40 mg  40 mg Intravenous Q24H Avon Gully, MD   40 mg at 01/20/12 1241  . potassium chloride SA (K-DUR,KLOR-CON) CR tablet 20 mEq  20 mEq Oral Daily  Avon Gully, MD   20 mEq at 01/20/12 1232  . senna (SENOKOT) tablet 17.2 mg  2 tablet Oral QHS Avon Gully, MD      . simvastatin (ZOCOR) tablet 40 mg  40 mg Oral QHS Avon Gully, MD      . sodium chloride 0.9 % bolus 500 mL  500 mL Intravenous Once Vida Roller, MD   500 mL at 01/20/12 0245  . sodium chloride 0.9 % injection        10 mL at 01/20/12 1233  . Vitamin D (Ergocalciferol) (DRISDOL) capsule 50,000 Units  50,000 Units Oral Q30 days Avon Gully, MD      . zinc sulfate capsule 220 mg  220 mg Oral Daily Avon Gully, MD   220 mg at 01/20/12 1232  . DISCONTD: amLODipine-benazepril (LOTREL) 5-20 MG per capsule 1 capsule  1 capsule Oral Daily Avon Gully, MD      . DISCONTD: enoxaparin (LOVENOX) injection 40 mg  40 mg Subcutaneous Q24H Avon Gully, MD      . DISCONTD: folic acid-vitamin b complex-vitamin c-selenium-zinc (DIALYVITE) tablet 1 tablet  1 tablet Oral Daily Avon Gully, MD      . DISCONTD: ondansetron (ZOFRAN) injection 4 mg  4 mg Intravenous Once Vida Roller, MD        Allergies as of 01/20/2012 - Review Complete 01/20/2012  Allergen Reaction Noted  . Codeine Other (See Comments) 07/29/2011  . Penicillins Other (See Comments) 07/29/2011  . Sulfa antibiotics Other (See Comments) 07/29/2011    Family History:  UNREMARKABLE   History   Social History  . Marital Status: Widowed    Spouse Name: N/A    Number of Children: N/A  . Years of Education: N/A   Occupational History  . Retired    Social History Main Topics  . Smoking status: Never Smoker   . Smokeless tobacco: Never Used  . Alcohol Use: No  . Drug Use: No  . Sexually Active: Not Currently   Other Topics Concern  . Not on file   Social History Narrative   Resident of [facility]    Review of Systems: PER HPI OTHERWISE  ALL SYSTEMS NEGATIVE  Vitals: Blood pressure 103/72, pulse 110, temperature 97.8 F (36.6 C), temperature source Oral, resp. rate 22, height 5\' 2"  (1.575 m), weight 116 lb (52.617 kg), SpO2 92.00%.  Physical Exam: General:   Alert,  pleasant and cooperative in NAD Head:  Normocephalic and atraumatic. Eyes:  Sclera clear, no icterus.   Conjunctiva pink. Mouth:  No deformity or lesions, dentition ABnormal. Neck:  Supple; no masses Lungs:  Clear throughout to auscultation.   No wheezes. No acute distress. Heart:  Regular rate and rhythm; no murmurs. Abdomen:  Soft, MILD TTP IN RUQ/RLQ. No masses noted. HYPERACTIVE bowel sounds, without guarding, and without rebound.   Msk:  Symmetrical with gross deformities.  Extremities:  Without edema. Neurologic:  Alert, no new focal deficits Cervical Nodes:  No significant cervical adenopathy. Psych:  Alert and cooperative. Normal mood and affect.   Lab Results:  [facility] 01/20/12 0237  WBC 13.6*  HGB 14.4  HCT 42.1  PLT 186   BMET  Basename 01/20/12 0237  NA 138  K 3.4*  CL 102  CO2 24  GLUCOSE 152*  BUN 24*  CREATININE 1.02  CALCIUM 9.2   Studies/Results: CXR MAR 8 NACPD  Impression: ACUTE ONSET OF ABDOMINAL PAIN FOLLOWED BY NAUSEA AND VOMITING. Differential diagnosis includes acute viral illness, less likely  bowel obstruction, acute hepatitis, cholecystitis, or pancreatitis.  Plan: CHECK HFP & LIPASE, KUB 2 VIEW. ZOFRAN ATC. CLEAR LIQUIDS. BID PPI WILL FOLLOW.  LOS: 0 days   [facility]  01/20/2012, 1:41 PM

## 2012-01-20 NOTE — H&P (Signed)
[name], [name]           ACCOUNT NO.:  [id number]  MEDICAL RECORD NO.:  [id number]  LOCATION:  A201                          FACILITY:  [facility]  PHYSICIAN:  Saiquan Hands D. Felecia Shelling, MD   DATE OF BIRTH:  Feb 04, 1917  DATE OF ADMISSION:  01/20/2012 DATE OF DISCHARGE:  LH                             HISTORY & PHYSICAL   CHIEF COMPLAINT:  Nausea and vomiting.  HISTORY OF PRESENT ILLNESS:  This is a 78 year old female patient with history of multiple medical illnesses, was brought to emergency room with above complaints.  The patient is a resident of [address]. She has been in her usual state of health until early this morning when she started to have recurrent nausea and vomiting.  Her symptoms got worse and the patient was brought to emergency room where she was evaluated.  The patient had recurrent vomiting while she was in the emergency room.  Her blood test showed sign of dehydration.  She was started on IV fluids and was admitted for further treatment.  REVIEW OF SYSTEMS:  No fever, chills, cough, shortness of breath, chest pain, abdominal pain, diarrhea, dysuria, urgency or frequency of urination.  PAST MEDICAL HISTORY: 1. History of interstitial pulmonary disease. 2. History of recurrent normal bronchitis. 3. Dementia. 4. Atrial fibrillation. 5. Hypertension. 6. Anxiety depression disorder. 7. History of left hip fracture. 8. Chronic pain syndrome. 9. Gastroesophageal reflux disease. 10.Coronary artery disease.  HOME MEDICATIONS: 1. Zofran 4 mg p.o. q.6 hours. 2. Protonix 40 mg daily. 3. Tylenol p.r.n. 4. Xanax 0.25 mg p.r.n. 5. Lortab 20 one tablet daily. 6. Aspirin 81 mg daily. 7. Calcium with vitamin D one tablet daily. 8. Plavix 75 mg daily. 9. Ventolin patch 25 mcg change q.72 hours pain. 10.Ferrous sulfate 325 mg daily. 11.Lasix 20 mg daily. 12.Vicodin 5/500 q.6h p.r.n. 13.Metoprolol XL 25 mg daily. 14.Nitroglycerin 0.4 mg daily. 15.KCl 20 mEq  daily. 16.Senna 8.6 mg daily. 17.Zocor 40 mg daily. 18.Zinc sulfate 228 daily.  SOCIAL HISTORY:  The patient is currently a resident of [address]. No history of alcohol, tobacco, or substance abuse.  FAMILY HISTORY:  Not available at this time.  PHYSICAL EXAMINATION:  GENERAL/VITAL SIGNS:  The patient is alert, awake, and chronically sick-looking with vitals blood pressure 133/71, pulse 103, respiratory rate 18, temperature 98 degrees Fahrenheit. CHEST:  Clear lung fields.  Good air entry. CARDIOVASCULAR SYSTEM:  First and second heart sounds heard.  No murmur, no gallop. ABDOMEN:  Soft and lax.  Bowel sound is positive.  No mass or organomegaly. EXTREMITIES:  No leg edema.  LABORATORY DATA:  Urinalysis, specific gravity 1.025, pH 5.0, nitrite negative, leukocytes negative.  CBC, WBC 13.6, hemoglobin 14.4, hematocrit 42.1, platelets 186.  BMP, sodium 138, potassium 3.4, chloride 102, carbon dioxide 24, glucose 152, BUN 24, creatinine 1.0, and calcium 92.  ASSESSMENT:  This is a 78 year old female patient with history of multiple medical illnesses, who presented with nausea and vomiting.  She has also sign of dehydration.  Her symptoms could be secondary to gastroenteritis.  PLAN:  We will keep the patient on clear liquid diet.  We will continue IV fluid for rehydration.  Continue symptomatic treatment.  A GI consult  has been done.     Demarus Latterell D. Felecia Shelling, MD     TDF/MEDQ  D:  01/20/2012  T:  01/20/2012  Job:  [id number]

## 2012-01-20 NOTE — ED Notes (Signed)
Pt resting calmly w/ eyes closed. Rise & fall of the chest noted. Bed in low position, side rails up x2. NAD noted at this time.  

## 2012-01-20 NOTE — ED Notes (Signed)
Urine collected with return and no issues. Reported to nurse RN Ron patient had some vomiting will in the room . Patient is also complaining of left shoulder pain. RN Luz Lex was also in the room.

## 2012-01-20 NOTE — ED Notes (Signed)
Pt requesting pain medication. Pt states her neck, back & shoulder are all hurting. Pt turned to her side & repositioned her in bed.

## 2012-01-21 MED ORDER — ATORVASTATIN CALCIUM 10 MG PO TABS
20.0000 mg | ORAL_TABLET | Freq: Every day | ORAL | Status: DC
  Administered 2012-01-21 – 2012-01-23 (×3): 20 mg via ORAL
  Filled 2012-01-21: qty 1
  Filled 2012-01-21: qty 2
  Filled 2012-01-21 (×3): qty 1

## 2012-01-21 MED ORDER — ONDANSETRON HCL 4 MG/2ML IJ SOLN: 4.0000 mg | Freq: Three times a day (TID) | INTRAMUSCULAR | Status: DC | PRN

## 2012-01-21 MED ORDER — SODIUM CHLORIDE 0.9 % IJ SOLN
INTRAMUSCULAR | Status: AC
  Filled 2012-01-21: qty 3

## 2012-01-21 MED ORDER — BOOST PLUS PO LIQD
237.0000 mL | Freq: Three times a day (TID) | ORAL | Status: DC
  Administered 2012-01-21: 237 mL via ORAL
  Filled 2012-01-21 (×6): qty 237

## 2012-01-21 MED ORDER — SODIUM CHLORIDE 0.9 % IJ SOLN
INTRAMUSCULAR | Status: AC
  Administered 2012-01-21: 17:00:00
  Filled 2012-01-21: qty 3

## 2012-01-21 MED ORDER — SENNA 8.6 MG PO TABS: 2.0000 | ORAL_TABLET | Freq: Every evening | ORAL | Status: DC | PRN

## 2012-01-21 NOTE — Progress Notes (Signed)
[name], [name]           ACCOUNT NO.:  [id number]  MEDICAL RECORD NO.:  [id number]  LOCATION:  A201                          FACILITY:  [facility]  PHYSICIAN:  Saydi Kobel D. Felecia Shelling, MD   DATE OF BIRTH:  Apr 28, 1917  DATE OF PROCEDURE:  01/21/2012 DATE OF DISCHARGE:                                PROGRESS NOTE   SUBJECTIVE:  The patient feels slightly better.  Her nausea and vomiting is improving.  She is on clear liquid diet.  OBJECTIVE:  GENERAL:  The patient is alert, awake, and chronically sick looking. VITAL SIGNS:  Blood pressure 147/71, pulse 112, respiratory rate 22, temperature 98.9 degrees Fahrenheit. CHEST:  Decreased air entry, few rhonchi. CARDIOVASCULAR SYSTEM:  First and second heart sounds heard.  No murmur. No gallop. ABDOMEN:  Soft and lax.  Bowel sound is positive.  No mass or organomegaly. EXTREMITIES:  No leg edema.  ASSESSMENT: 1. Gastroenteritis, clinically improving. 2. Mild dehydration. 3. Dementia. 4. Protein-calorie malnutrition. 5. Deconditioning.  PLAN:  Continue the patient on IV fluid.  We will monitor her CBC, BMP. Continue current medications and supportive care.     Shatisha Falter D. Felecia Shelling, MD     TDF/MEDQ  D:  01/21/2012  T:  01/21/2012  Job:  [id number]

## 2012-01-21 NOTE — Progress Notes (Signed)
Subjective: Since I last evaluated the patient SHE HAS HAD A LARGE BM.NO VOMITING OVERNIGHT PER NURSING OR TODAY. APPEARS AGITATED AND CONFUSED. ON ZOF ATC & SENNA QHS. TOLERATING CLD.  Objective: Vital signs in last 24 hours: Temp:  [97.5 F (36.4 C)-98.9 F (37.2 C)] 97.5 F (36.4 C) (03/09 0629) Pulse Rate:  [76-110] 76  (03/09 0629) Resp:  [18-22] 18  (03/09 0629) BP: (96-107)/(61-72) 96/61 mmHg (03/09 0629) SpO2:  [92 %-96 %] 92 % (03/09 0629) Last BM Date: 01/19/12  Intake/Output from previous day: 03/08 0701 - 03/09 0700 In: 600 [P.O.:600] Out: 700 [Urine:700] Intake/Output this shift:    General appearance: appears stated age, delirious and no distress Resp: clear to auscultation bilaterally Cardio: regular rate and rhythm GI: soft, non-tender; bowel sounds normal; no masses,  no organomegaly  Lab Results:  [facility] 01/20/12 0237  WBC 13.6*  HGB 14.4  HCT 42.1  PLT 186   BMET  Basename 01/20/12 0237  NA 138  K 3.4*  CL 102  CO2 24  GLUCOSE 152*  BUN 24*  CREATININE 1.02  CALCIUM 9.2   LFT  Basename 01/20/12 0237  PROT 7.3  ALBUMIN 4.3  AST 21  ALT 16  ALKPHOS 55  BILITOT 0.5  BILIDIR 0.1  IBILI 0.4   PT/INR No results found for this basename: LABPROT:2,INR:2 in the last 72 hours Hepatitis Panel No results found for this basename: HEPBSAG,HCVAB,HEPAIGM,HEPBIGM in the last 72 hours C-Diff No results found for this basename: CDIFFTOX:3 in the last 72 hours Fecal Lactopherrin No results found for this basename: FECLLACTOFRN in the last 72 hours  Studies/Results: Dg Chest Port 1 View  01/20/2012  *RADIOLOGY REPORT*  Clinical Data: Shortness of breath, cough, nausea, vomiting, fever, history breast cancer, atrial fibrillation, hypertension, vascular disease, GERD  PORTABLE CHEST - 1 VIEW  Comparison: Portable exam 0245 hours compared to 09/06/2011  Findings: Enlargement of cardiac silhouette with evidence of a coronary arterial stent. Calcified  tortuous aorta. Pulmonary vascularity normal. Emphysematous and chronic bronchitic changes. Right greater than left upper lobe scarring. No definite infiltrate, pleural effusion or pneumothorax. Bilateral glenohumeral degenerative changes. Osseous demineralization.  IMPRESSION: Enlargement of cardiac silhouette. COPD with upper lobe scarring. No acute abnormalities.  Original Report Authenticated By: Lollie Marrow, M.D.   Dg Abd 2 Views  01/20/2012  *RADIOLOGY REPORT*  Clinical Data: Vomiting and abdominal pain.  ABDOMEN - 2 VIEW  Comparison: None  Findings: The lung bases are clear.  The abdominal bowel gas pattern demonstrates air throughout the small bowel and colon suggesting an ileus or gastroenteritis.  No findings for small bowel obstruction or free air.  The bony structures are intact. Lumbar scoliosis and degenerative changes are noted.  A left hip prosthesis is noted.  IMPRESSION:  Bowel gas pattern may suggest an ileus or gastroenteritis.  No findings for obstruction or perforation.  Original Report Authenticated By: P. Loralie Champagne, M.D.    Medications: I have reviewed the patient's current medications.  Assessment/Plan: ACUTE ONSET OF ABDOMINAL PAIN AND VOMITING-LABS/XRAY REVEALED NAIAP. VOMITING LIKEY DUE TO ACUTE VIRAL ILLNESS & NOW RESOLVED & NOW PT CONFUSED-DUE TO DEMENTIA +/- MEDS.  PLAN: 1. CHANGE TO ZOFRAN AND SENNA TO  PRN. 2. ADVANCE DIET. 3. BOOST TID.   LOS: 1 day   [name] 01/21/2012, 9:30 AM

## 2012-01-22 LAB — CBC
HCT: 40.5 % (ref 36.0–46.0)
MCH: 30.9 pg (ref 26.0–34.0)
MCHC: 33.1 g/dL (ref 30.0–36.0)
RDW: 14.1 % (ref 11.5–15.5)

## 2012-01-22 LAB — BASIC METABOLIC PANEL
BUN: 12 mg/dL (ref 6–23)
Calcium: 8.6 mg/dL (ref 8.4–10.5)
GFR calc Af Amer: 82 mL/min — ABNORMAL LOW (ref >90)
GFR calc non Af Amer: 70 mL/min — ABNORMAL LOW (ref >90)
Potassium: 4 mEq/L (ref 3.5–5.1)

## 2012-01-22 MED ORDER — BOOST PLUS PO LIQD
237.0000 mL | Freq: Three times a day (TID) | ORAL | Status: DC
  Administered 2012-01-23 – 2012-01-24 (×3): 237 mL via ORAL
  Filled 2012-01-22 (×11): qty 237

## 2012-01-22 MED ORDER — SODIUM CHLORIDE 0.9 % IJ SOLN
INTRAMUSCULAR | Status: AC
  Administered 2012-01-22: 10 mL
  Filled 2012-01-22: qty 3

## 2012-01-22 MED ORDER — SODIUM CHLORIDE 0.9 % IJ SOLN
INTRAMUSCULAR | Status: AC
  Administered 2012-01-22: 23:00:00
  Filled 2012-01-22: qty 3

## 2012-01-22 NOTE — Progress Notes (Addendum)
Subjective: Since I last evaluated the patient STILL HAVING FREQUENT WATERY STOOL AFTER SENNA. C/O LOWER ABDOMINAL PAIN, NO VOMITING.   Objective: Vital signs in last 24 hours: Temp:  [97.3 F (36.3 C)-99.1 F (37.3 C)] 97.3 F (36.3 C) (03/10 0506) Pulse Rate:  [75-87] 75  (03/10 0506) Resp:  [18] 18  (03/10 0506) BP: (114-138)/(62-81) 114/62 mmHg (03/10 0506) SpO2:  [95 %-100 %] 97 % (03/10 0506) Last BM Date: 01/21/12  Intake/Output from previous day: 03/09 0701 - 03/10 0700 In: 1577 [P.O.:1177; I.V.:400] Out: 275 [Urine:275] Intake/Output this shift:    General appearance: alert, cooperative, appears stated age and no distress GI: normal findings: bowel sounds normal and soft, abnormal findings:  mild tenderness in the lower abdomen  Lab Results:  [facility] 01/22/12 0642 02-11-12 0237  WBC 5.9 13.6*  HGB 13.4 14.4  HCT 40.5 42.1  PLT 169 186   BMET  Basename 2012-02-11 0237  NA 138  K 3.4*  CL 102  CO2 24  GLUCOSE 152*  BUN 24*  CREATININE 1.02  CALCIUM 9.2   LFT  Basename 02/11/12 0237  PROT 7.3  ALBUMIN 4.3  AST 21  ALT 16  ALKPHOS 55  BILITOT 0.5  BILIDIR 0.1  IBILI 0.4   PT/INR No results found for this basename: LABPROT:2,INR:2 in the last 72 hours Hepatitis Panel No results found for this basename: HEPBSAG,HCVAB,HEPAIGM,HEPBIGM in the last 72 hours C-Diff No results found for this basename: CDIFFTOX:3 in the last 72 hours Fecal Lactopherrin No results found for this basename: FECLLACTOFRN in the last 72 hours  Studies/Results: Dg Abd 2 Views  February 11, 2012  *RADIOLOGY REPORT*  Clinical Data: Vomiting and abdominal pain.  ABDOMEN - 2 VIEW  Comparison: None  Findings: The lung bases are clear.  The abdominal bowel gas pattern demonstrates air throughout the small bowel and colon suggesting an ileus or gastroenteritis.  No findings for small bowel obstruction or free air.  The bony structures are intact. Lumbar scoliosis and degenerative changes are  noted.  A left hip prosthesis is noted.  IMPRESSION:  Bowel gas pattern may suggest an ileus or gastroenteritis.  No findings for obstruction or perforation.  Original Report Authenticated By: P. Loralie Champagne, M.D.    Medications: I have reviewed the patient's current medications.  Assessment/Plan: ADMITTED WITH ABDOMINAL PAIN AND VOMITING. VOMITING RESOLVES. NOW WITH WATERY STOOLS.  PLAN: 1. ADVANCE DIET. 2. ADD BOOST TID. 3. CHECK STOOL FOR C DIFF. 4. KAOPECTATE PRN FOR LOOSE STOOLS. 5. D/C SENNA.   LOS: 2 days   [name] 01/22/2012, 7:40 AM  WILL SIGN OFF. CALL WITH QUESTIONS.

## 2012-01-22 NOTE — Progress Notes (Signed)
[name], [name]           ACCOUNT NO.:  [id number]  MEDICAL RECORD NO.:  [id number]  LOCATION:  A316                          FACILITY:  [facility]  PHYSICIAN:  Kaydenn Mclear D. Felecia Shelling, MD   DATE OF BIRTH:  1917/02/03  DATE OF PROCEDURE:  01/22/2012 DATE OF DISCHARGE:                                PROGRESS NOTE   SUBJECTIVE:  The patient feels slightly better.  Her nausea, vomiting and diarrhea has stopped.  No abdominal pain.  OBJECTIVE:  GENERAL:  The patient is alert, awake, and sick looking. VITAL SIGNS:  Blood pressure 114/62, pulse 75, respiratory rate 18, temperature 97 degrees Fahrenheit.  and CHEST:  Clear lung fields.  Good air entry. CARDIOVASCULAR SYSTEM:  First and second heart sounds heard.  No murmur. No gallop. ABDOMEN:  Soft and lax.  Bowel sound is positive.  No mass or organomegaly. EXTREMITIES:  No leg edema.  LAB:  CBC:  WBC 5.9, hemoglobin 13.4, hematocrit 40.5, and platelets 165.  BMP:  Sodium 140, potassium 4.0 chloride 107, carbon dioxide 27, glucose 100, BUN 12, creatinine 0.7.  ASSESSMENT: 1. Gastroenteritis, clinically improving. 2. Dementia. 3. Chronic pain. 4. Dehydration, clinically improving.  PLAN:  We will advance diet to full liquid diet.  We will continue her regular medications.  Continue supportive care.     Thaddeaus Monica D. Felecia Shelling, MD     TDF/MEDQ  D:  01/22/2012  T:  01/22/2012  Job:  [id number]

## 2012-01-22 NOTE — Progress Notes (Signed)
CSW met with pt to address discharge plan. Pt was very confused and not oriented. CSW contacted pt's son, Jorja Loa, who is pt's HCPOA, for further detail. Psychosocial assessment is in pt's chart. Pt is a resident of [facility] unit. Pt's is agreeable to pt returning to [facility] when pt is medically ready. CSW will update FL2 and contact Teola Bradley regarding pt's discharge. CSW to continue to follow.   Dede Query, MSW, [facility] Weekend Coverage [phone]

## 2012-01-24 NOTE — Progress Notes (Signed)
Pt to be transported back to [facility] today.

## 2012-01-24 NOTE — Discharge Summary (Signed)
[name], [name]           ACCOUNT NO.:  [id number]  MEDICAL RECORD NO.:  [id number]  LOCATION:  A316                          FACILITY:  [facility]  PHYSICIAN:  Zaylee Cornia D. Felecia Shelling, MD   DATE OF BIRTH:  May 31, 1917  DATE OF ADMISSION:  01/20/2012 DATE OF DISCHARGE:  03/12/2013LH                              DISCHARGE SUMMARY   DISCHARGE DIAGNOSES: 1. Acute gastroenteritis. 2. Mild dehydration. 3. Dementia. 4. Atrial fibrillation. 5. Hypertension. 6. History of interstitial pulmonary disease. 7. History of recurrent bronchitis. 8. Anxiety and depression disorder. 9. Left hip fracture. 10.Gastroesophageal reflux disease. 11.Coronary artery disease. 12.Chronic pain syndrome.  DISCHARGE MEDICATIONS: 1. Acetaminophen 500 q.6 h. p.r.n. 2. Xanax 0.25 mg 2 tablets daily as needed. 3. Lortab 5/500 one tablet daily. 4. Aspirin 81 mg daily. 5. Plavix 75 mg daily. 6. Fentanyl patch, 1 patch changed q.72 h. 7. Ferrous sulfate 325 mg daily. 8. Folic acid 1 tablet daily. 9. Lasix 20 mg daily. 10.Vicodin 5/500 one tablet p.o. q.6 h. 11.Imodium 2 mg p.o. p.r.n. for diarrhea. 12.Metoprolol 50 mg q.24 h. 13.Nitrostat 0.4 mg sublingual q.5 minutes p.r.n. 14.Zofran 4 mg daily. 15.Os-Cal with vitamin D 1 tablet p.o. daily. 16.Protonix 40 mg daily. 17.K-Dur 20 mEq daily. 18.Senna 8.6 mg 2 tablets daily. 19.Simvastatin 40 mg daily. 20.Ergocalciferol 50,000 units monthly. 21.Zinc sulfate 220 mg daily.  DISPOSITION:  The patient will be discharged back to [facility].  DISCHARGE INSTRUCTIONS:  The patient will be followed in the office in 1- week duration.  LABORATORY DATA ON ON DISCHARGE:  CBC:  WBC 5.9, hemoglobin 13.4, hematocrit 40.5, and platelets 169.  BMP:  Sodium 140, potassium 4.0, chloride 107, carbon dioxide 27, glucose 100, BUN 12, creatinine 0.7, calcium 8.6.  HOSPITAL COURSE:  This is a 78 year old female patient with history of multiple medical illnesses, was  admitted due to nausea and vomiting. She had also associated diarrhea.  The patient was very weak and unable to take oral feeding.  She had sign of mild dehydration.  The patient was admitted and was started on IV fluid and evaluated by gastroenterologist.  The patient gradually improved.  She is able to tolerate her regular diet.  Her symptoms improved.  The patient is back to her baseline.  She will be discharged back to assisted living and will be followed in the office in 1-week duration.     Morgan Maeda D. Felecia Shelling, MD     TDF/MEDQ  D:  01/24/2012  T:  01/24/2012  Job:  [id number]

## 2012-01-24 NOTE — Progress Notes (Addendum)
Clinical Social Work:  Following for discharge planning and return to facility: [facility].  Patient can return and CSW has faxed dc paperwork and reconciled Fl2 for facility to review. Plan for facility to provide transportation for back to return back to ALF.  Awaiting review of clinicals and then will update family of dc.    Ashley Jacobs, MSW LCSW [phone]  Patient clinicals have been reviewed and patient able to return to the [facility].  Called patient son with regards to dc but could not reach.  Patient plans to dc with facility coming to pick patient up.  No other needs at this time.  Ashley Jacobs, MSW LCSW [phone]

## 2012-01-24 NOTE — Progress Notes (Signed)
[name], [name]           ACCOUNT NO.:  [id number]  MEDICAL RECORD NO.:  [id number]  LOCATION:  A316                          FACILITY:  [facility]  PHYSICIAN:  Berenice Oehlert D. Felecia Shelling, MD   DATE OF BIRTH:  1917/02/14  DATE OF PROCEDURE:  01/23/2012 DATE OF DISCHARGE:                                PROGRESS NOTE   SUBJECTIVE:  The patient is feeling better.  Her nausea and vomiting has stopped.  She is tolerating full clear liquids.  OBJECTIVE:  GENERAL:  Patient is alert, awake, and resting. VITAL SIGNS:  Blood pressure 103/68, pulse 73, respiratory rate 20, temperature 97.8 degree Fahrenheit. CHEST:  Decreased air entry, few rhonchi. CARDIOVASCULAR SYSTEM:  First and second heart sounds heard.  No murmur, no gallop. ABDOMEN:  Soft and lax.  Bowel sound is positive.  No mass or organomegaly. EXTREMITIES:  No leg edema.  ASSESSMENT: 1. Gastroenteritis, clinically improving. 2. Dementia. 3. Chronic pain syndrome. 4. Mild dehydration.  PLAN:  We will continue on regular diet.  We will continue slow hydration, and continue her regular medications.     [name] D. Felecia Shelling, MD     TDF/MEDQ  D:  01/23/2012  T:  01/23/2012  Job:  [id number]

## 2012-01-24 NOTE — Progress Notes (Signed)
   CARE MANAGEMENT NOTE 01/24/2012  Patient:  [name], [name]   Account Number:  [id number]  Date Initiated:  01/24/2012  Documentation initiated by:  Sharrie Rothman  Subjective/Objective Assessment:   Pt admitted from [facility] with nausea and vomiting. Pt has no equipment or home health PTA.     Action/Plan:   CSW consulted for arrangement of discharge back to [facility].   Anticipated DC Date:  01/24/2012   Anticipated DC Plan:  ASSISTED LIVING / REST HOME  In-house referral  Clinical Social Worker      DC Planning Services  CM consult      Choice offered to / List presented to:             Status of service:  Completed, signed off Medicare Important Message given?  YES (If response is "NO", the following Medicare IM given date fields will be blank) Date Medicare IM given:  01/24/2012 Date Additional Medicare IM given:    Discharge Disposition:  ASSISTED LIVING  Per UR Regulation:    If discussed at Long Length of Stay Meetings, dates discussed:    Comments:  01/24/12 Arlyss Queen, RN [facility] Care manager [phone] Pt admitted from [facility]. CSW working with pt for discharge back to facility. IM placed in pt packet. Pt confused and unable to sign and no family is available. No other discharge needs noted at this time.

## 2012-04-07 ENCOUNTER — Emergency Department (HOSPITAL_COMMUNITY)
Admission: EM | Admit: 2012-04-07 | Discharge: 2012-04-07 | Disposition: A | Discharge status: Home / Self Care | Payer: Medicare Other | Attending: Emergency Medicine | Admitting: Emergency Medicine

## 2012-04-07 ENCOUNTER — Encounter (HOSPITAL_COMMUNITY): Payer: Self-pay

## 2012-04-07 DIAGNOSIS — R21 Rash and other nonspecific skin eruption: Secondary | ICD-10-CM | POA: Insufficient documentation

## 2012-04-07 DIAGNOSIS — Z79899 Other long term (current) drug therapy: Secondary | ICD-10-CM | POA: Insufficient documentation

## 2012-04-07 DIAGNOSIS — T7840XA Allergy, unspecified, initial encounter: Secondary | ICD-10-CM

## 2012-04-07 DIAGNOSIS — F0390 Unspecified dementia without behavioral disturbance: Secondary | ICD-10-CM | POA: Insufficient documentation

## 2012-04-07 DIAGNOSIS — M109 Gout, unspecified: Secondary | ICD-10-CM | POA: Insufficient documentation

## 2012-04-07 DIAGNOSIS — K219 Gastro-esophageal reflux disease without esophagitis: Secondary | ICD-10-CM | POA: Insufficient documentation

## 2012-04-07 DIAGNOSIS — F411 Generalized anxiety disorder: Secondary | ICD-10-CM | POA: Insufficient documentation

## 2012-04-07 DIAGNOSIS — Z853 Personal history of malignant neoplasm of breast: Secondary | ICD-10-CM | POA: Insufficient documentation

## 2012-04-07 DIAGNOSIS — I10 Essential (primary) hypertension: Secondary | ICD-10-CM | POA: Insufficient documentation

## 2012-04-07 DIAGNOSIS — I4891 Unspecified atrial fibrillation: Secondary | ICD-10-CM | POA: Insufficient documentation

## 2012-04-07 DIAGNOSIS — I2510 Atherosclerotic heart disease of native coronary artery without angina pectoris: Secondary | ICD-10-CM | POA: Insufficient documentation

## 2012-04-07 MED ORDER — DIPHENHYDRAMINE HCL 50 MG/ML IJ SOLN
INTRAMUSCULAR | Status: AC
  Administered 2012-04-07: 50 mg
  Filled 2012-04-07: qty 1

## 2012-04-07 MED ORDER — METHYLPREDNISOLONE SODIUM SUCC 125 MG IJ SOLR
125.0000 mg | Freq: Once | INTRAMUSCULAR | Status: AC
  Administered 2012-04-07: 125 mg via INTRAMUSCULAR
  Filled 2012-04-07: qty 2

## 2012-04-07 MED ORDER — PREDNISONE 10 MG PO TABS: 10.0000 mg | ORAL_TABLET | Freq: Two times a day (BID) | ORAL | Status: DC

## 2012-04-07 NOTE — ED Notes (Signed)
Pt from [facility].  Per ems, pt was c/o leg pain and itching today.  Staff reports that pt has red area to lower extremities.  Staff reports that pt started taking Septra on the 21st.  Staff denies any other changes to the pt's plan of care.  Pt denies any swelling or difficulty breathing.  Pt was give 50mg  benadryl IM in route to facility.

## 2012-04-07 NOTE — ED Provider Notes (Signed)
History  Scribed for Morgan Lennert, MD, the patient was seen in room APA16A/APA16A. This chart was scribed by [name]. The patient's care started at 11:22 AM    CSN: [id number]  Arrival date & time 04/07/12  1031   First MD Initiated Contact with Patient 04/07/12 1116      Chief Complaint  Patient presents with  . Rash     Patient is a 78 y.o. female presenting with rash. The history is provided by the patient.  Rash  This is a new problem. The current episode started 12 to 24 hours ago. The problem has not changed since onset.The problem is associated with nothing. There has been no fever. The rash is present on the left lower leg and right lower leg. The pain is mild. The pain has been constant since onset. Associated symptoms include itching and pain. Treatments tried: benadryl. Risk factors include new medications (Septra).   [name] is a 78 y.o. female who presents to the Emergency Department BIBA from [facility] with a rash on both of her lower legs.  Per EMS pt is experiencing leg pain and itching that started today.  Pt was given 50mg  of benadryl IM in route to ED.  Staff at [facility] report that she started taking Septra on the 21st.  She denies swelling or difficulty breathing.     Past Medical History  Diagnosis Date  . Carcinoma of breast 2008    Right mastectomy  . Paroxysmal atrial fibrillation   . Hypertension   . Anxiety   . Arteriosclerotic cardiovascular disease (ASCVD)     Multiple prior PCI, most recently in 2007  . Dementia   . Gastroesophageal reflux disease   . Gout   . Hip fracture, left 2011  . Chest pain     Past Surgical History  Procedure Date  . Orif hip fracture 2011  . Cataract extraction   . Mastectomy 2008    Right    No family history on file.  History  Substance Use Topics  . Smoking status: Never Smoker   . Smokeless tobacco: Never Used  . Alcohol Use: No    OB History    Grav Para Term Preterm  Abortions TAB SAB Ect Mult Living                  Review of Systems  Skin: Positive for itching and rash.       itching  All other systems reviewed and are negative.    Allergies  Codeine; Penicillins; and Sulfa antibiotics  Home Medications   Current Outpatient Rx  Name Route Sig Dispense Refill  . ACETAMINOPHEN 500 MG PO TABS Oral Take 500 mg by mouth every 6 (six) hours as needed. For pain     . ALPRAZOLAM 0.25 MG PO TABS Oral Take 0.25 mg by mouth 2 (two) times daily as needed. For agitation and/or anxiety     . AMLODIPINE BESY-BENAZEPRIL HCL 5-20 MG PO CAPS Oral Take 1 capsule by mouth daily.      . ASPIRIN EC 81 MG PO TBEC Oral Take 81 mg by mouth daily.      Marland Kitchen CALCIUM CARB-CHOLECALCIFEROL 500-400 MG-UNIT PO TABS Oral Take 1 tablet by mouth daily.      Marland Kitchen CLOPIDOGREL BISULFATE 75 MG PO TABS Oral Take 75 mg by mouth daily.      . FENTANYL 25 MCG/HR TD PT72 Transdermal Place 1 patch onto the skin every 3 (three)  days. Remove used patches before applying a new patch     . FERROUS SULFATE 325 (65 FE) MG PO TABS Oral Take 325 mg by mouth 2 (two) times daily.      Marland Kitchen DIALYVITE 3000 3 MG PO TABS Oral Take 1 tablet by mouth daily.      . FUROSEMIDE 20 MG PO TABS Oral Take 20 mg by mouth daily.      Marland Kitchen HYDROCODONE-ACETAMINOPHEN 5-500 MG PO TABS Oral Take 1 tablet by mouth every 6 (six) hours as needed. For pain     . LOPERAMIDE HCL 2 MG PO CAPS Oral Take 2 mg by mouth daily as needed. For Diarrhea     . METOPROLOL SUCCINATE ER 50 MG PO TB24 Oral Take 50 mg by mouth at bedtime.      Marland Kitchen NITROGLYCERIN 0.4 MG SL SUBL Sublingual Place 1 tablet (0.4 mg total) under the tongue every 5 (five) minutes as needed. 25 tablet 11  . ONDANSETRON HCL 4 MG PO TABS Oral Take 4 mg by mouth every 6 (six) hours as needed.    Marland Kitchen PANTOPRAZOLE SODIUM 40 MG PO TBEC Oral Take 40 mg by mouth daily.      Marland Kitchen POTASSIUM CHLORIDE CRYS ER 20 MEQ PO TBCR Oral Take 20 mEq by mouth daily.      . SENNA 8.6 MG PO TABS Oral  Take 2 tablets by mouth daily.    Marland Kitchen SIMVASTATIN 40 MG PO TABS Oral Take 40 mg by mouth at bedtime.      Marland Kitchen VITAMIN D (ERGOCALCIFEROL) 50000 UNITS PO CAPS Oral Take 50,000 Units by mouth every 30 (thirty) days.     Marland Kitchen ZINC SULFATE 220 MG PO CAPS Oral Take 220 mg by mouth daily.        BP 124/50  Pulse 71  Temp(Src) 98.4 F (36.9 C) (Oral)  Resp 18  SpO2 95%  Physical Exam  Nursing note and vitals reviewed. Constitutional: She is oriented to person, place, and time. She appears well-developed and well-nourished. No distress.  HENT:  Head: Normocephalic and atraumatic.       Dry mucous membranes  Eyes: EOM are normal. Pupils are equal, round, and reactive to light.  Neck: Neck supple. No tracheal deviation present.  Cardiovascular:       2/6 systolic murmur.   Pulmonary/Chest: Effort normal. No respiratory distress. She has wheezes (mild bilaterally).  Abdominal: Soft. She exhibits no distension. There is tenderness (minimal).  Musculoskeletal: Normal range of motion. She exhibits no edema.  Neurological: She is alert and oriented to person, place, and time. No sensory deficit.       Only oriented to person  Skin: Skin is warm and dry.       Macular blanching rash to both distal legs anteriorly.   Psychiatric: She has a normal mood and affect. Her behavior is normal.    ED Course  Procedures    DIAGNOSTIC STUDIES: Oxygen Saturation is 95% on room air, normal by my interpretation.    COORDINATION OF CARE:    Labs Reviewed - No data to display No results found.   No diagnosis found.    MDM   The chart was scribed for me under my direct supervision.  I personally performed the history, physical, and medical decision making and all procedures in the evaluation of this patient.Morgan Lennert, MD 04/07/12 1352

## 2012-04-07 NOTE — Discharge Instructions (Signed)
Have your doctor check the rash in 3-4 days

## 2012-04-07 NOTE — ED Notes (Signed)
Report called to Nursing home, report received by Shanda Bumps. Staff states they will be here to pick pt up in ~ 30 min.

## 2012-04-07 NOTE — ED Notes (Signed)
Patient is comfortable in bed.

## 2012-04-07 NOTE — ED Notes (Signed)
Patient is resting comfortably. 

## 2012-04-07 NOTE — ED Notes (Signed)
Patient was dressed to go home. [facility] here to pick her up.

## 2012-05-11 ENCOUNTER — Inpatient Hospital Stay (HOSPITAL_COMMUNITY)
Admission: EM | Admit: 2012-05-11 | Discharge: 2012-05-16 | DRG: 947 | Disposition: A | Discharge status: Nursing Home (Includes ICF) | Payer: Medicare Other | Attending: Internal Medicine | Admitting: Internal Medicine

## 2012-05-11 ENCOUNTER — Emergency Department (HOSPITAL_COMMUNITY): Payer: Medicare Other

## 2012-05-11 ENCOUNTER — Encounter (HOSPITAL_COMMUNITY): Payer: Self-pay | Admitting: Emergency Medicine

## 2012-05-11 ENCOUNTER — Other Ambulatory Visit: Payer: Self-pay

## 2012-05-11 DIAGNOSIS — K219 Gastro-esophageal reflux disease without esophagitis: Secondary | ICD-10-CM | POA: Diagnosis present

## 2012-05-11 DIAGNOSIS — F411 Generalized anxiety disorder: Secondary | ICD-10-CM | POA: Diagnosis present

## 2012-05-11 DIAGNOSIS — F1190 Opioid use, unspecified, uncomplicated: Secondary | ICD-10-CM

## 2012-05-11 DIAGNOSIS — Z853 Personal history of malignant neoplasm of breast: Secondary | ICD-10-CM

## 2012-05-11 DIAGNOSIS — Z880 Allergy status to penicillin: Secondary | ICD-10-CM

## 2012-05-11 DIAGNOSIS — G894 Chronic pain syndrome: Secondary | ICD-10-CM | POA: Diagnosis present

## 2012-05-11 DIAGNOSIS — Z79899 Other long term (current) drug therapy: Secondary | ICD-10-CM

## 2012-05-11 DIAGNOSIS — T40605A Adverse effect of unspecified narcotics, initial encounter: Secondary | ICD-10-CM | POA: Diagnosis present

## 2012-05-11 DIAGNOSIS — R627 Adult failure to thrive: Secondary | ICD-10-CM | POA: Diagnosis present

## 2012-05-11 DIAGNOSIS — G9341 Metabolic encephalopathy: Secondary | ICD-10-CM | POA: Diagnosis present

## 2012-05-11 DIAGNOSIS — Z7982 Long term (current) use of aspirin: Secondary | ICD-10-CM

## 2012-05-11 DIAGNOSIS — A498 Other bacterial infections of unspecified site: Secondary | ICD-10-CM | POA: Diagnosis present

## 2012-05-11 DIAGNOSIS — I10 Essential (primary) hypertension: Secondary | ICD-10-CM | POA: Diagnosis present

## 2012-05-11 DIAGNOSIS — Z882 Allergy status to sulfonamides status: Secondary | ICD-10-CM

## 2012-05-11 DIAGNOSIS — M1990 Unspecified osteoarthritis, unspecified site: Secondary | ICD-10-CM | POA: Diagnosis present

## 2012-05-11 DIAGNOSIS — F0390 Unspecified dementia without behavioral disturbance: Secondary | ICD-10-CM | POA: Diagnosis present

## 2012-05-11 DIAGNOSIS — Z886 Allergy status to analgesic agent status: Secondary | ICD-10-CM

## 2012-05-11 DIAGNOSIS — R4182 Altered mental status, unspecified: Principal | ICD-10-CM | POA: Diagnosis present

## 2012-05-11 DIAGNOSIS — N390 Urinary tract infection, site not specified: Secondary | ICD-10-CM | POA: Diagnosis present

## 2012-05-11 DIAGNOSIS — I4891 Unspecified atrial fibrillation: Secondary | ICD-10-CM | POA: Diagnosis present

## 2012-05-11 DIAGNOSIS — I2510 Atherosclerotic heart disease of native coronary artery without angina pectoris: Secondary | ICD-10-CM | POA: Diagnosis present

## 2012-05-11 DIAGNOSIS — Y9210 Unspecified residential institution as the place of occurrence of the external cause: Secondary | ICD-10-CM | POA: Diagnosis present

## 2012-05-11 DIAGNOSIS — Z9010 Acquired absence of unspecified breast and nipple: Secondary | ICD-10-CM

## 2012-05-11 LAB — URINE MICROSCOPIC-ADD ON: RBC / HPF: NONE SEEN RBC/hpf (ref <3)

## 2012-05-11 LAB — COMPREHENSIVE METABOLIC PANEL
Albumin: 3.6 g/dL (ref 3.5–5.2)
Alkaline Phosphatase: 71 U/L (ref 39–117)
BUN: 16 mg/dL (ref 6–23)
Chloride: 101 mEq/L (ref 96–112)
Creatinine, Ser: 0.93 mg/dL (ref 0.50–1.10)
GFR calc Af Amer: 60 mL/min — ABNORMAL LOW (ref >90)
Glucose, Bld: 92 mg/dL (ref 70–99)
Total Bilirubin: 0.5 mg/dL (ref 0.3–1.2)
Total Protein: 7.1 g/dL (ref 6.0–8.3)

## 2012-05-11 LAB — BLOOD GAS, ARTERIAL
Acid-Base Excess: 2 mmol/L (ref 0.0–2.0)
Bicarbonate: 25.4 mEq/L — ABNORMAL HIGH (ref 20.0–24.0)
O2 Saturation: 96.7 %
TCO2: 21.3 mmol/L (ref 0–100)
pCO2 arterial: 35.7 mmHg (ref 35.0–45.0)
pO2, Arterial: 81.7 mmHg (ref 80.0–100.0)

## 2012-05-11 LAB — MRSA PCR SCREENING: MRSA by PCR: POSITIVE — AB

## 2012-05-11 LAB — URINALYSIS, ROUTINE W REFLEX MICROSCOPIC
Bilirubin Urine: NEGATIVE
Glucose, UA: NEGATIVE mg/dL
Ketones, ur: NEGATIVE mg/dL
Leukocytes, UA: NEGATIVE
Protein, ur: NEGATIVE mg/dL
pH: 7.5 (ref 5.0–8.0)

## 2012-05-11 LAB — CARDIAC PANEL(CRET KIN+CKTOT+MB+TROPI)
CK, MB: 2.1 ng/mL (ref 0.3–4.0)
Total CK: 43 U/L (ref 7–177)

## 2012-05-11 LAB — CBC WITH DIFFERENTIAL/PLATELET
Basophils Relative: 0 % (ref 0–1)
Eosinophils Absolute: 0.2 10*3/uL (ref 0.0–0.7)
HCT: 45.9 % (ref 36.0–46.0)
Hemoglobin: 15.3 g/dL — ABNORMAL HIGH (ref 12.0–15.0)
Lymphs Abs: 3.2 10*3/uL (ref 0.7–4.0)
MCH: 31.3 pg (ref 26.0–34.0)
MCHC: 33.3 g/dL (ref 30.0–36.0)
Monocytes Absolute: 0.7 10*3/uL (ref 0.1–1.0)
Monocytes Relative: 10 % (ref 3–12)
RBC: 4.89 MIL/uL (ref 3.87–5.11)

## 2012-05-11 MED ORDER — SENNA 8.6 MG PO TABS
2.0000 | ORAL_TABLET | Freq: Every day | ORAL | Status: DC
  Administered 2012-05-11 – 2012-05-16 (×6): 17.2 mg via ORAL
  Filled 2012-05-11: qty 2
  Filled 2012-05-11: qty 1
  Filled 2012-05-11: qty 2
  Filled 2012-05-11 (×5): qty 1

## 2012-05-11 MED ORDER — SODIUM CHLORIDE 0.9 % IV BOLUS (SEPSIS)
1000.0000 mL | Freq: Once | INTRAVENOUS | Status: AC
  Administered 2012-05-11: 1000 mL via INTRAVENOUS

## 2012-05-11 MED ORDER — NALOXONE HCL 0.4 MG/ML IJ SOLN
0.4000 mg | Freq: Once | INTRAMUSCULAR | Status: AC
  Administered 2012-05-11: 0.4 mg via INTRAVENOUS
  Filled 2012-05-11: qty 1

## 2012-05-11 MED ORDER — AMLODIPINE BESY-BENAZEPRIL HCL 5-20 MG PO CAPS: 1.0000 | ORAL_CAPSULE | Freq: Every day | ORAL | Status: DC

## 2012-05-11 MED ORDER — ALPRAZOLAM 0.25 MG PO TABS
0.2500 mg | ORAL_TABLET | Freq: Two times a day (BID) | ORAL | Status: DC | PRN
  Administered 2012-05-11 – 2012-05-15 (×6): 0.25 mg via ORAL
  Filled 2012-05-11 (×7): qty 1

## 2012-05-11 MED ORDER — ONDANSETRON HCL 4 MG/2ML IJ SOLN: 4.0000 mg | Freq: Three times a day (TID) | INTRAMUSCULAR | Status: AC | PRN

## 2012-05-11 MED ORDER — NITROGLYCERIN 0.4 MG SL SUBL: 0.4000 mg | SUBLINGUAL_TABLET | SUBLINGUAL | Status: DC | PRN

## 2012-05-11 MED ORDER — MUPIROCIN 2 % EX OINT
1.0000 "application " | TOPICAL_OINTMENT | Freq: Two times a day (BID) | CUTANEOUS | Status: AC
  Administered 2012-05-11 – 2012-05-16 (×10): 1 via NASAL
  Filled 2012-05-11 (×2): qty 22

## 2012-05-11 MED ORDER — ONDANSETRON HCL 4 MG PO TABS
4.0000 mg | ORAL_TABLET | Freq: Four times a day (QID) | ORAL | Status: DC | PRN
  Administered 2012-05-14: 4 mg via ORAL
  Filled 2012-05-11: qty 1

## 2012-05-11 MED ORDER — ACETAMINOPHEN 500 MG PO TABS
500.0000 mg | ORAL_TABLET | Freq: Four times a day (QID) | ORAL | Status: DC | PRN
  Administered 2012-05-11: 500 mg via ORAL
  Filled 2012-05-11 (×2): qty 1

## 2012-05-11 MED ORDER — LOPERAMIDE HCL 2 MG PO CAPS: 2.0000 mg | ORAL_CAPSULE | Freq: Every day | ORAL | Status: DC | PRN

## 2012-05-11 MED ORDER — ASPIRIN EC 81 MG PO TBEC
81.0000 mg | DELAYED_RELEASE_TABLET | Freq: Every day | ORAL | Status: DC
  Administered 2012-05-12 – 2012-05-16 (×5): 81 mg via ORAL
  Filled 2012-05-11 (×5): qty 1

## 2012-05-11 MED ORDER — HYDROCODONE-ACETAMINOPHEN 5-325 MG PO TABS
1.0000 | ORAL_TABLET | ORAL | Status: DC | PRN
  Administered 2012-05-11: 1 via ORAL
  Filled 2012-05-11: qty 1

## 2012-05-11 MED ORDER — POTASSIUM CHLORIDE CRYS ER 20 MEQ PO TBCR
20.0000 meq | EXTENDED_RELEASE_TABLET | Freq: Every day | ORAL | Status: DC
  Administered 2012-05-11 – 2012-05-16 (×6): 20 meq via ORAL
  Filled 2012-05-11 (×6): qty 1

## 2012-05-11 MED ORDER — BENAZEPRIL HCL 10 MG PO TABS
20.0000 mg | ORAL_TABLET | Freq: Every day | ORAL | Status: DC
  Administered 2012-05-12 – 2012-05-16 (×5): 20 mg via ORAL
  Filled 2012-05-11: qty 2
  Filled 2012-05-11: qty 1
  Filled 2012-05-11: qty 2
  Filled 2012-05-11: qty 1
  Filled 2012-05-11: qty 2
  Filled 2012-05-11: qty 1
  Filled 2012-05-11: qty 2

## 2012-05-11 MED ORDER — CALCIUM CARBONATE-VITAMIN D 500-200 MG-UNIT PO TABS
1.0000 | ORAL_TABLET | Freq: Every day | ORAL | Status: DC
  Administered 2012-05-12 – 2012-05-16 (×5): 1 via ORAL
  Filled 2012-05-11 (×6): qty 1

## 2012-05-11 MED ORDER — SODIUM CHLORIDE 0.9 % IV SOLN
INTRAVENOUS | Status: AC
  Administered 2012-05-11: 15:00:00 via INTRAVENOUS

## 2012-05-11 MED ORDER — ENOXAPARIN SODIUM 30 MG/0.3ML ~~LOC~~ SOLN
30.0000 mg | SUBCUTANEOUS | Status: DC
  Administered 2012-05-11 – 2012-05-15 (×5): 30 mg via SUBCUTANEOUS
  Filled 2012-05-11 (×5): qty 0.3

## 2012-05-11 MED ORDER — CHLORHEXIDINE GLUCONATE CLOTH 2 % EX PADS
6.0000 | MEDICATED_PAD | Freq: Every day | CUTANEOUS | Status: DC
  Administered 2012-05-12 – 2012-05-16 (×4): 6 via TOPICAL

## 2012-05-11 MED ORDER — ACETAMINOPHEN 500 MG PO TABS
500.0000 mg | ORAL_TABLET | Freq: Four times a day (QID) | ORAL | Status: DC | PRN
  Administered 2012-05-12: 500 mg via ORAL

## 2012-05-11 MED ORDER — PANTOPRAZOLE SODIUM 40 MG PO TBEC
40.0000 mg | DELAYED_RELEASE_TABLET | Freq: Every day | ORAL | Status: DC
  Administered 2012-05-12 – 2012-05-16 (×5): 40 mg via ORAL
  Filled 2012-05-11 (×5): qty 1

## 2012-05-11 MED ORDER — ADULT MULTIVITAMIN W/MINERALS CH
1.0000 | ORAL_TABLET | Freq: Every day | ORAL | Status: DC
  Administered 2012-05-12 – 2012-05-16 (×5): 1 via ORAL
  Filled 2012-05-11 (×5): qty 1

## 2012-05-11 MED ORDER — NALOXONE HCL 1 MG/ML IJ SOLN
2.0000 mg | Freq: Once | INTRAMUSCULAR | Status: AC
  Administered 2012-05-11: 2 mg via INTRAVENOUS
  Filled 2012-05-11: qty 2

## 2012-05-11 MED ORDER — AMLODIPINE BESYLATE 5 MG PO TABS
5.0000 mg | ORAL_TABLET | Freq: Every day | ORAL | Status: DC
  Administered 2012-05-12 – 2012-05-13 (×2): 5 mg via ORAL
  Filled 2012-05-11 (×2): qty 1

## 2012-05-11 MED ORDER — SODIUM CHLORIDE 0.9 % IV BOLUS (SEPSIS)
500.0000 mL | Freq: Once | INTRAVENOUS | Status: AC
  Administered 2012-05-11: 10:00:00 via INTRAVENOUS

## 2012-05-11 NOTE — H&P (Signed)
[name], [name]           ACCOUNT NO.:  [id number]  MEDICAL RECORD NO.:  [id number]  LOCATION:  IC05                          FACILITY:  [facility]  PHYSICIAN:  Mila Homer. Sudie Bailey, M.D.DATE OF BIRTH:  1917-03-01  DATE OF ADMISSION:  05/11/2012 DATE OF DISCHARGE:  LH                             HISTORY & PHYSICAL   HISTORY:  This 78 year old was brought from a local nursing home, by the ER record, into the [facility] Emergency Department due to change in mental status.  It was felt that this might have been secondary to an overload of narcotic.  In the ER, she was given Narcan on 3 occasions.  After receiving Narcan, she would wake up and return more to her usual self, but then within a couple hours, the Narcan would wear off and she would require another dose.  According to the patient, she lives at home with her husband.  According to the record, she stays in a local nursing home.  She is followed by Dr. Felecia Shelling, her local medical doctor.  PAST MEDICAL HISTORY: She has a long and complicated medical history: 1. She has had carcinoma of the breast in 2008 requiring a right     mastectomy. 2. She has paroxysm atrial fibrillation. 3. Hypertension. 4. Anxiety. 5. Dementia. 6. Gastroesophageal reflux disease. 7. Gout. 8. She is status post left hip fracture in 2011 and a radial styloid     fracture the same year. 9. Arterial sclerotic cardiovascular disease.  CURRENT MEDICATIONS: 1. Acetaminophen 500 mg q.6 h. for pain. 2. Alprazolam 0.25 mg b.i.d. for agitation. 3. Amlodipine/benazepril 5/20 capsule daily. 4. Aspirin 81 mg daily. 5. Calcium carbonate/cholecalciferol 500/400 daily. 6. Fentanyl 25 mcg/hour patch every 3 days. 7. Folic acid with B complex, vitamin C, selenium and zinc tablet     daily. 8. Hydrocodone/APAP 5/500 q.6 h. for pain. 9. Pantoprazole 40 mg daily. 10.Potassium chloride 20 mEq daily. 11.Senna 8.6 mg two tablets  daily. 12.Loperamide 2 mg as needed for diarrhea. 13.Nitroglycerin 0.4 mg sublingually q.5 minutes x3 doses for chest     pain. 14.Ondansetron 4 mg q.6 h. p.r.n. nausea or vomiting.  PHYSICAL EXAMINATION:  GENERAL:  At the time of my exam, she was in the ICU.  She is sitting up in bed.  She appeared to be alert.  Her speech appears to be normal, although sometimes was halting.  She was frail and elderly. VITAL SIGNS:  Temperature is 98.1, pulse 107, respiratory rate 25, blood pressure 130/78. HEART:  Appeared to have a fairly regular rhythm, rate of about 100. LUNGS:  Appear clear throughout, but there were decreased lung sounds. ABDOMEN:  Soft without organomegaly or mass. EXTREMITIES:  There is no edema of the ankles.  LABORATORY DATA: Her admission white cell count was 6600, hemoglobin 15.3 with a platelet count of 184,000.  Cardiac profile was negative.  CMP normal except for an estimated GFR 52.  Her UA was negative.  Her blood gas on room air showed pH 7.47, PCO2 35.7, pO2 81.7, bicarb 25.  DIAGNOSTICS: 1. Her admission chest x-ray showed COPD and chronic changes with a     cardiac stent noted. 2. CT of the  head without contrast showed atrophy and chronic ischemic     change.  This was confirmed by the MRI of the brain which showed     extensive atrophy and extensive chronic small vessel disease     affecting the thalami, basal ganglia, and hemisphere deep white     matter. 3. Her 12-lead EKG showed what appeared to be a sinus tachycardia with     premature atrial complexes at a rate of 105 with incomplete right     bundle branch block, possible LVH.  Based on EKG findings, a septal     infarct could not be ruled out.  ADMISSION DIAGNOSES: 1. Mental status changes probably secondary to opioid medication. 2. Dementia. 3. Benign essential hypertension. 4. Anxiety. 5. Coronary artery disease status post stent placement. 6. Gastroesophageal reflux disease. 7. Carcinoma of  the right breast status post mastectomy 5 years ago. 8. Fracture of the left hip status post surgery 2 years ago. At present she is doing well, but complaining of pain particularly in her neck.  Her sensorium has been stable during the late afternoon, so I reinstituted the hydrocodone, but not the fentanyl patch, particularly given her age.  I will arrange for a cervical spine x-ray tomorrow.  I have reviewed multiple x-rays she has had done over the last several years, but cervical spine x-ray has not been one of these, and this is the area she is complaining of the most pain.  I will continue her other medications as noted.     Mila Homer. Sudie Bailey, M.D.     SDK/MEDQ  D:  05/11/2012  T:  05/11/2012  Job:  [id number]

## 2012-05-11 NOTE — ED Notes (Signed)
Pt had large BM on stretcher with large amount urine noted also. Pt cleaned and changed. Pt remains awake but confused at times. Oriented to person and place.

## 2012-05-11 NOTE — ED Notes (Signed)
Cardiac monitor showing ST with frequent PAC's. Pt alert and oriented to person at this time. Follows some commands. Remains confused. Able to understand speech at this time. Pt begging to go back home. Pt had voided large amount on stretcher. Cleaned and linen changed. Warm blankets given.

## 2012-05-11 NOTE — ED Notes (Signed)
Pt having second large semi formed BM. Remains awake but confused at times.

## 2012-05-11 NOTE — ED Notes (Signed)
Pt back to being drowsy but arouses easily. Pt sneezing and yawning at intervals. Son at bedside. Dr. Manus Gunning in to check patient.

## 2012-05-11 NOTE — ED Notes (Signed)
Pt awake and talking at this time. Pt placed on bedpan due to need to have BM. Cardiac monitor showing SR with 1st degree AV block. Family at bedside. IV infusing with no edema or redness.

## 2012-05-11 NOTE — ED Notes (Signed)
cbg priior to arrival 121. Iv attempted unsuccessful upon arrival.

## 2012-05-11 NOTE — ED Notes (Signed)
Pt woke up after narcan. Linens soiled at time pt woke. Linens changed. Pt repeatedly yawning but awake.

## 2012-05-11 NOTE — ED Provider Notes (Signed)
History   Level 5 Caveat - Could not obtain full HPI/ROS due to patients altered mental status.  This chart was scribed for Morgan Octave, MD by Shari Heritage. The patient was seen in room APA05/APA05. Patient's care was started at 0933.     CSN: [id number]  Arrival date & time 05/11/12  7829   First MD Initiated Contact with Patient 05/11/12 [phone]      Chief Complaint  Patient presents with  . Altered Mental Status    (Consider location/radiation/quality/duration/timing/severity/associated sxs/prior treatment) The history is provided by the patient, the EMS personnel and the nursing home. No language interpreter was used.   [name] is a 78 y.o. female who presents to the Emergency Department complaining of altered mental status. Nursing home patient was transported to the ED by EMS. EMS report states that she was eating breakfast when she became unresponsive. Nursing home staff say that she is usually very active. Patient complains of left arm pain. Patient denies chest pain, HA, abdominal pain. Patient is oriented to place - patient is aware that she's in the hospital. When asked, patient responded that the day of week is Saturday. Patient cannot explain why she was transported to the hospital. Blood glucose level taken by EMS is 121. Patient was administered Vicodin 4x last night. Patient has a Fentanyl patch that was removed by EMS PTA. Patient with h/o carnicoma of breast resulting in a right mastectomy in 2008. Patient also with h/o HTN, proxysmal a-fib, dementia, ASCVD, GERD, dementia, and hip fracture.  Past Medical History  Diagnosis Date  . Carcinoma of breast 2008    Right mastectomy  . Paroxysmal atrial fibrillation   . Hypertension   . Anxiety   . Arteriosclerotic cardiovascular disease (ASCVD)     Multiple prior PCI, most recently in 2007  . Dementia   . Gastroesophageal reflux disease   . Gout   . Hip fracture, left 2011  . Chest pain     Past  Surgical History  Procedure Date  . Orif hip fracture 2011  . Cataract extraction   . Mastectomy 2008    Right    History reviewed. No pertinent family history.  History  Substance Use Topics  . Smoking status: Never Smoker   . Smokeless tobacco: Never Used  . Alcohol Use: No    OB History    Grav Para Term Preterm Abortions TAB SAB Ect Mult Living                  Review of Systems  Unable to perform ROS   Allergies  Codeine; Penicillins; and Sulfa antibiotics  Home Medications   Current Outpatient Rx  Name Route Sig Dispense Refill  . ACETAMINOPHEN 500 MG PO TABS Oral Take 500 mg by mouth every 6 (six) hours as needed. For pain     . ALPRAZOLAM 0.25 MG PO TABS Oral Take 0.25 mg by mouth 2 (two) times daily as needed. For agitation and/or anxiety     . AMLODIPINE BESY-BENAZEPRIL HCL 5-20 MG PO CAPS Oral Take 1 capsule by mouth daily.      . ASPIRIN EC 81 MG PO TBEC Oral Take 81 mg by mouth daily.      Marland Kitchen CALCIUM CARB-CHOLECALCIFEROL 500-400 MG-UNIT PO TABS Oral Take 1 tablet by mouth daily.      . FENTANYL 25 MCG/HR TD PT72 Transdermal Place 1 patch onto the skin every 3 (three) days.    Marland Kitchen DIALYVITE 3000 3 MG PO  TABS Oral Take 1 tablet by mouth daily.      Marland Kitchen HYDROCODONE-ACETAMINOPHEN 5-500 MG PO TABS Oral Take 1 tablet by mouth every 6 (six) hours as needed. For pain    . PANTOPRAZOLE SODIUM 40 MG PO TBEC Oral Take 40 mg by mouth daily.      Marland Kitchen POTASSIUM CHLORIDE CRYS ER 20 MEQ PO TBCR Oral Take 20 mEq by mouth daily.      . SENNA 8.6 MG PO TABS Oral Take 2 tablets by mouth daily.    Marland Kitchen LOPERAMIDE HCL 2 MG PO CAPS Oral Take 2 mg by mouth daily as needed. For Diarrhea    . NITROGLYCERIN 0.4 MG SL SUBL Sublingual Place 0.4 mg under the tongue every 5 (five) minutes x 3 doses as needed.    Marland Kitchen ONDANSETRON HCL 4 MG PO TABS Oral Take 4 mg by mouth every 6 (six) hours as needed. For nausea/vomiting      BP 167/82  Pulse 108  Resp 17  SpO2 98%  Physical Exam  Nursing note  and vitals reviewed. Constitutional: She is oriented to person, place, and time. She appears well-developed and well-nourished.       Slow to respond to commands.  HENT:  Head: Normocephalic and atraumatic.       Dry mucous membranes.  Eyes: Conjunctivae and EOM are normal. Pupils are equal, round, and reactive to light. Pupils are equal.       Pupils small, equal and sluggish.  Neck: Normal range of motion. Neck supple.  Cardiovascular: Normal rate and regular rhythm.   Pulmonary/Chest: Effort normal and breath sounds normal. No respiratory distress. She has no wheezes.  Abdominal: Soft. Bowel sounds are normal.  Musculoskeletal: Normal range of motion.       Moves all extremities.  Neurological: She is alert and oriented to person, place, and time.       Oriented x2.  Skin: Skin is warm and dry.  Psychiatric: She has a normal mood and affect.    ED Course  Procedures (including critical care time) DIAGNOSTIC STUDIES: Oxygen Saturation is 93% on room air, adequate by my interpretation.    COORDINATION OF CARE: 9:55AM- Patient informed of current plan for treatment and evaluation and agrees with plan at this time. Will order head CT and chest X-ray.  10:23AM- Patient is more responsive after Narcan injection.  12:15PM- Upon recheck, patient is still non responsive.  Results for orders placed during the hospital encounter of 05/11/12  CBC WITH DIFFERENTIAL      Component Value Range   WBC 6.6  4.0 - 10.5 K/uL   RBC 4.89  3.87 - 5.11 MIL/uL   Hemoglobin 15.3 (*) 12.0 - 15.0 g/dL   HCT 16.1  09.6 - 04.5 %   MCV 93.9  78.0 - 100.0 fL   MCH 31.3  26.0 - 34.0 pg   MCHC 33.3  30.0 - 36.0 g/dL   RDW 40.9  81.1 - 91.4 %   Platelets 184  150 - 400 K/uL   Neutrophils Relative 38 (*) 43 - 77 %   Neutro Abs 2.5  1.7 - 7.7 K/uL   Lymphocytes Relative 49 (*) 12 - 46 %   Lymphs Abs 3.2  0.7 - 4.0 K/uL   Monocytes Relative 10  3 - 12 %   Monocytes Absolute 0.7  0.1 - 1.0 K/uL    Eosinophils Relative 3  0 - 5 %   Eosinophils Absolute 0.2  0.0 - 0.7  K/uL   Basophils Relative 0  0 - 1 %   Basophils Absolute 0.0  0.0 - 0.1 K/uL  COMPREHENSIVE METABOLIC PANEL      Component Value Range   Sodium 139  135 - 145 mEq/L   Potassium 3.7  3.5 - 5.1 mEq/L   Chloride 101  96 - 112 mEq/L   CO2 27  19 - 32 mEq/L   Glucose, Bld 92  70 - 99 mg/dL   BUN 16  6 - 23 mg/dL   Creatinine, Ser 1.61  0.50 - 1.10 mg/dL   Calcium 9.6  8.4 - 09.6 mg/dL   Total Protein 7.1  6.0 - 8.3 g/dL   Albumin 3.6  3.5 - 5.2 g/dL   AST 18  0 - 37 U/L   ALT 9  0 - 35 U/L   Alkaline Phosphatase 71  39 - 117 U/L   Total Bilirubin 0.5  0.3 - 1.2 mg/dL   GFR calc non Af Amer 52 (*) >90 mL/min   GFR calc Af Amer 60 (*) >90 mL/min  CARDIAC PANEL(CRET KIN+CKTOT+MB+TROPI)      Component Value Range   Total CK 43  7 - 177 U/L   CK, MB 2.1  0.3 - 4.0 ng/mL   Troponin I <0.30  <0.30 ng/mL   Relative Index RELATIVE INDEX IS INVALID  0.0 - 2.5  URINALYSIS, ROUTINE W REFLEX MICROSCOPIC      Component Value Range   Color, Urine YELLOW  YELLOW   APPearance CLEAR  CLEAR   Specific Gravity, Urine 1.010  1.005 - 1.030   pH 7.5  5.0 - 8.0   Glucose, UA NEGATIVE  NEGATIVE mg/dL   Hgb urine dipstick TRACE (*) NEGATIVE   Bilirubin Urine NEGATIVE  NEGATIVE   Ketones, ur NEGATIVE  NEGATIVE mg/dL   Protein, ur NEGATIVE  NEGATIVE mg/dL   Urobilinogen, UA 0.2  0.0 - 1.0 mg/dL   Nitrite NEGATIVE  NEGATIVE   Leukocytes, UA NEGATIVE  NEGATIVE  BLOOD GAS, ARTERIAL      Component Value Range   FIO2 21.00     pH, Arterial 7.467 (*) 7.350 - 7.400   pCO2 arterial 35.7  35.0 - 45.0 mmHg   pO2, Arterial 81.7  80.0 - 100.0 mmHg   Bicarbonate 25.4 (*) 20.0 - 24.0 mEq/L   TCO2 21.3  0 - 100 mmol/L   Acid-Base Excess 2.0  0.0 - 2.0 mmol/L   O2 Saturation 96.7     Patient temperature 37.0     Collection site RIGHT RADIAL     Drawn by COLLECTED BY RT     Sample type ARTERIAL     Allens test (pass/fail) PASS  PASS  URINE  MICROSCOPIC-ADD ON      Component Value Range   WBC, UA 0-2  <3 WBC/hpf   RBC / HPF    <3 RBC/hpf   Value: NO FORMED ELEMENTS SEEN ON URINE MICROSCOPIC EXAMINATION   Dg Chest 1 View  05/11/2012  *RADIOLOGY REPORT*  Clinical Data: Altered mental status.  CHEST - 1 VIEW  Comparison: 01/20/2012  Findings: Chronic changes in the upper lobes compatible with scarring.  Changes of COPD.  Heart is upper limits normal in size. Cardiac stent noted.  No effusions or acute bony abnormality. Degenerative changes in the shoulders.  IMPRESSION: COPD/chronic changes.  No active disease.  Original Report Authenticated By: Cyndie Chime, M.D.   Ct Head Wo Contrast  05/11/2012  *RADIOLOGY REPORT*  Clinical  Data: Altered mental status  CT HEAD WITHOUT CONTRAST  Technique:  Contiguous axial images were obtained from the base of the skull through the vertex without contrast.  Comparison: CT 09/11/2010  Findings: The patient would not hold still and there is motion on the study.  Multiple images were repeated.  Moderate to advanced atrophy.  Chronic microvascular ischemia in the white matter.  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  Negative for acute infarct.  Negative for hemorrhage or mass. Atherosclerotic disease is present.  IMPRESSION: Atrophy and chronic ischemic change.  No acute abnormality.  Original Report Authenticated By: Camelia Phenes, M.D.    1. Altered mental status   2. Opiate use       MDM  Altered mental status and decreased level of responsiveness since this morning.  No history of trauma. Awakens to voice, oriented x2, follows commands.  Fentanyl patch removed.  More awake after narcan. No evidence of infection in UA or chest xray. CT head negative.  Patient with waxing and waning mental status.  Responsive to narcan transiently. Required multiple doses in the ED.  MRI ordered to evaluate for possible CVA. Becoming more tachycardic with frequent PACs, likely secondary to opiate antagonism.     Unclear if more contributing to AMS than opiates but no other etiology identified at this point. Admission to stepdown discussed with Dr. Sudie Bailey who accepts patient for Dr. Felecia Shelling.    Date: 05/11/2012  Rate: 66  Rhythm: normal sinus rhythm  QRS Axis: left  Intervals: PR prolonged  ST/T Wave abnormalities: nonspecific ST/T changes  Conduction Disutrbances:first-degree A-V block  and left anterior fascicular block  Narrative Interpretation: rate slower, septal Q waves stable  Old EKG Reviewed: changes noted   Date: 05/11/2012  Rate: 105  Rhythm: sinus tachycardia and premature atrial contractions (PAC)  QRS Axis: left  Intervals: PR prolonged  ST/T Wave abnormalities: nonspecific ST/T changes  Conduction Disutrbances:first-degree A-V block   Narrative Interpretation:   Old EKG Reviewed: unchanged   CRITICAL CARE Performed by: [name]   Total critical care time: 30  Critical care time was exclusive of separately billable procedures and treating other patients.  Critical care was necessary to treat or prevent imminent or life-threatening deterioration.  Critical care was time spent personally by me on the following activities: development of treatment plan with patient and/or surrogate as well as nursing, discussions with consultants, evaluation of patient's response to treatment, examination of patient, obtaining history from patient or surrogate, ordering and performing treatments and interventions, ordering and review of laboratory studies, ordering and review of radiographic studies, pulse oximetry and re-evaluation of patient's condition.   I personally performed the services described in this documentation, which was scribed in my presence.  The recorded information has been reviewed and considered.    Morgan Octave, MD 05/12/12 [phone]

## 2012-05-11 NOTE — Progress Notes (Signed)
Paged Dr. Sudie Bailey at 16:05 as ordered under temporary orders from ER.  Awaiting return call.

## 2012-05-11 NOTE — ED Notes (Signed)
Pt became more alert and oriented after second dose of narcan. Pt placed on bedpan again.

## 2012-05-11 NOTE — ED Notes (Signed)
Pt very drowsy again but opens eyes to verbal stimuli at this time. Pt moving all extremities. Talking but unable to understand at this time. Unable to do swallow screen due to order for patient to be NPO. Waiting to give report to ICU. Cardiac monitor showing ST with occasional PAC.

## 2012-05-11 NOTE — ED Notes (Signed)
Report from EMS pt was at breakfast, leaned over at table and became unresponsive . arousable with verbal command.

## 2012-05-11 NOTE — ED Notes (Signed)
Back from xray

## 2012-05-11 NOTE — ED Notes (Signed)
Pt gone to MRI. Waiting for bed placement. Cardiac monitor showing ST. No c/o pain at present.

## 2012-05-11 NOTE — Progress Notes (Signed)
md notified of pt inability to void. New orders received and noted

## 2012-05-12 ENCOUNTER — Inpatient Hospital Stay (HOSPITAL_COMMUNITY): Payer: Medicare Other

## 2012-05-12 MED ORDER — SODIUM CHLORIDE 0.9 % IV SOLN
INTRAVENOUS | Status: AC
  Administered 2012-05-12 – 2012-05-13 (×2): via INTRAVENOUS

## 2012-05-12 MED ORDER — DILTIAZEM HCL 60 MG PO TABS
60.0000 mg | ORAL_TABLET | Freq: Four times a day (QID) | ORAL | Status: DC
  Administered 2012-05-12 – 2012-05-16 (×16): 60 mg via ORAL
  Filled 2012-05-12 (×16): qty 1

## 2012-05-12 MED ORDER — HYDROCODONE-ACETAMINOPHEN 5-325 MG PO TABS
1.0000 | ORAL_TABLET | Freq: Four times a day (QID) | ORAL | Status: DC | PRN
  Administered 2012-05-12 – 2012-05-16 (×8): 1 via ORAL
  Filled 2012-05-12 (×9): qty 1

## 2012-05-12 NOTE — Progress Notes (Signed)
MD notified of transfer to ICU.  New orders received to start Cardizem PO and continue to monitor in ICU.  Orders reviewed with Orlando Penner, RN.  Schonewitz, Candelaria Stagers 05/12/2012

## 2012-05-12 NOTE — Progress Notes (Signed)
Patient's heart rate 170 consistently on telemetry monitor.  Went to check on patient and found accurate reading of 170 bpm apically.  Patient stated she was nervous, but no other symptoms of distress present at this time.  MD notified.  Orders to transfer back to ICU and start on IV Cardizem.  Called report to Orlando Penner, RN.  Transferred patient to ICU-5.  Schonewitz, Candelaria Stagers 05/12/2012

## 2012-05-12 NOTE — Progress Notes (Signed)
Subjective: This is a 78 year old patient of Dr. Letitia Neri who was admitted yesterday with altered mental status thought to be due to narcotic use. She has apparently been on a fentanyl patch and oral narcotics for some time but became less responsive. She was brought to the emergency room and Narcan seemed to take her back to her previous state. This morning she says she's having severe pain. She has no other new complaints. She is somewhat demented so it's difficult to be certain about her history  Objective: Vital signs in last 24 hours: Temp:  [97.4 F (36.3 C)-98.1 F (36.7 C)] 97.8 F (36.6 C) (06/29 0742) Pulse Rate:  [80-109] 104  (06/29 0800) Resp:  [13-34] 34  (06/29 0800) BP: (102-173)/(60-97) 153/78 mmHg (06/29 0800) SpO2:  [92 %-99 %] 99 % (06/29 0800) Weight:  [45.5 kg (100 lb 5 oz)-47.8 kg (105 lb 6.1 oz)] 47.8 kg (105 lb 6.1 oz) (06/29 0500) Weight change:  Last BM Date: 05/11/12  Intake/Output from previous day: 06/28 0701 - 06/29 0700 In: 1400 [I.V.:900; IV Piggyback:500] Out: 1501 [Urine:1500; Stool:1]  PHYSICAL EXAM General appearance: alert and mild distress Resp: clear to auscultation bilaterally Cardio: regular rate and rhythm, S1, S2 normal, no murmur, click, rub or gallop GI: soft, non-tender; bowel sounds normal; no masses,  no organomegaly Extremities: extremities normal, atraumatic, no cyanosis or edema  Lab Results:    Basic Metabolic Panel:  Basename 05/11/12 1026  NA 139  K 3.7  CL 101  CO2 27  GLUCOSE 92  BUN 16  CREATININE 0.93  CALCIUM 9.6  MG --  PHOS --   Liver Function Tests:  Basename 05/11/12 1026  AST 18  ALT 9  ALKPHOS 71  BILITOT 0.5  PROT 7.1  ALBUMIN 3.6   No results found for this basename: LIPASE:2,AMYLASE:2 in the last 72 hours No results found for this basename: AMMONIA:2 in the last 72 hours CBC:  Basename 05/11/12 1026  WBC 6.6  NEUTROABS 2.5  HGB 15.3*  HCT 45.9  MCV 93.9  PLT 184   Cardiac  Enzymes:  Basename 05/11/12 1026  CKTOTAL 43  CKMB 2.1  CKMBINDEX --  TROPONINI <0.30   BNP: No results found for this basename: PROBNP:3 in the last 72 hours D-Dimer: No results found for this basename: DDIMER:2 in the last 72 hours CBG: No results found for this basename: GLUCAP:6 in the last 72 hours Hemoglobin A1C: No results found for this basename: HGBA1C in the last 72 hours Fasting Lipid Panel: No results found for this basename: CHOL,HDL,LDLCALC,TRIG,CHOLHDL,LDLDIRECT in the last 72 hours Thyroid Function Tests: No results found for this basename: TSH,T4TOTAL,FREET4,T3FREE,THYROIDAB in the last 72 hours Anemia Panel: No results found for this basename: VITAMINB12,FOLATE,FERRITIN,TIBC,IRON,RETICCTPCT in the last 72 hours Coagulation: No results found for this basename: LABPROT:2,INR:2 in the last 72 hours Urine Drug Screen: Drugs of Abuse  No results found for this basename: labopia, cocainscrnur, labbenz, amphetmu, thcu, labbarb    Alcohol Level: No results found for this basename: ETH:2 in the last 72 hours Urinalysis:  Basename 05/11/12 1203  COLORURINE YELLOW  LABSPEC 1.010  PHURINE 7.5  GLUCOSEU NEGATIVE  HGBUR TRACE*  BILIRUBINUR NEGATIVE  KETONESUR NEGATIVE  PROTEINUR NEGATIVE  UROBILINOGEN 0.2  NITRITE NEGATIVE  LEUKOCYTESUR NEGATIVE   Misc. Labs:  ABGS  Basename 05/11/12 1525  PHART 7.467*  PO2ART 81.7  TCO2 21.3  HCO3 25.4*   CULTURES Recent Results (from the past 240 hour(s))  MRSA PCR SCREENING  Status: Abnormal   Collection Time   05/11/12  3:33 PM      Component Value Range Status Comment   MRSA by PCR POSITIVE (*) NEGATIVE Final    Studies/Results: Dg Chest 1 View  05/11/2012  *RADIOLOGY REPORT*  Clinical Data: Altered mental status.  CHEST - 1 VIEW  Comparison: 01/20/2012  Findings: Chronic changes in the upper lobes compatible with scarring.  Changes of COPD.  Heart is upper limits normal in size. Cardiac stent noted.  No  effusions or acute bony abnormality. Degenerative changes in the shoulders.  IMPRESSION: COPD/chronic changes.  No active disease.  Original Report Authenticated By: Cyndie Chime, M.D.   Dg Cervical Spine Complete  05/12/2012  *RADIOLOGY REPORT*  Clinical Data: Severe pain in the cervical spine.  CERVICAL SPINE - COMPLETE 4+ VIEW  Comparison: No priors.  Findings: No dietary view is nondiagnostic.  The lateral view only displays a cervical spine from the base of the skull to the inferior endplate of C6 (C7 and the cervicothoracic junction are incompletely visualized).  With these limitations in mind, there is no definite acute displaced fracture or acute malalignment of the cervical spine.  Prevertebral soft tissues are normal.  Bones are osteopenic.  There is multilevel loss of intervertebral disc height with associated facet heart arthropathy.  Heterotopic ossification in the posterior paraspinal soft tissues as likely relates to remote spinal trauma.  IMPRESSION: 1.  Very limited examination demonstrating no definite acute radiographic abnormality of the cervical spine.  If there is clinical concern for odontoid fracture or acute traumatic injury at the level of C7 or C7-T1, further evaluation with CT of the cervical spine may be warranted. 2.  Severe multilevel degenerative disc disease and cervical spondylosis, as above.  Original Report Authenticated By: Florencia Reasons, M.D.   Ct Head Wo Contrast  05/11/2012  *RADIOLOGY REPORT*  Clinical Data: Altered mental status  CT HEAD WITHOUT CONTRAST  Technique:  Contiguous axial images were obtained from the base of the skull through the vertex without contrast.  Comparison: CT 09/11/2010  Findings: The patient would not hold still and there is motion on the study.  Multiple images were repeated.  Moderate to advanced atrophy.  Chronic microvascular ischemia in the white matter.  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  Negative for acute infarct.  Negative  for hemorrhage or mass. Atherosclerotic disease is present.  IMPRESSION: Atrophy and chronic ischemic change.  No acute abnormality.  Original Report Authenticated By: Camelia Phenes, M.D.   Mr Brain Wo Contrast  05/11/2012  *RADIOLOGY REPORT*  Clinical Data: Mental status changes.  Disorientation.  MRI HEAD WITHOUT CONTRAST  Technique:  Multiplanar, multiecho pulse sequences of the brain and surrounding structures were obtained according to standard protocol without intravenous contrast.  Comparison: Head CT same day  Findings: The study suffers from motion degradation.  Diffusion imaging does not show any acute or subacute infarction.  The brainstem and cerebellum do not show any focal insult.  The cerebral hemispheres show extensive atrophy and chronic small vessel disease affecting the thalami, basal ganglia and hemispheric deep white matter.  No cortical or large vessel territory infarction.  No mass lesion, hemorrhage, hydrocephalus or extra- axial collection.  There is an insignificant 5 mm lipoma along the anterior falx.  No pituitary mass.  No skull or skull base lesion.  IMPRESSION: Atrophy extensive chronic small vessel disease.  No acute or reversible findings.  Original Report Authenticated By: Thomasenia Sales, M.D.  Medications:  Scheduled:   . sodium chloride   Intravenous STAT  . amLODipine  5 mg Oral Daily   And  . benazepril  20 mg Oral Daily  . aspirin EC  81 mg Oral Daily  . calcium-vitamin D  1 tablet Oral Daily  . Chlorhexidine Gluconate Cloth  6 each Topical Q0600  . enoxaparin (LOVENOX) injection  30 mg Subcutaneous Q24H  . multivitamin with minerals  1 tablet Oral Daily  . mupirocin ointment  1 application Nasal BID  . naloxone  0.4 mg Intravenous Once  . naloxone  0.4 mg Intravenous Once  . naloxone  2 mg Intravenous Once  . pantoprazole  40 mg Oral Q1200  . potassium chloride SA  20 mEq Oral Daily  . senna  2 tablet Oral Daily  . sodium chloride  1,000 mL  Intravenous Once  . sodium chloride  500 mL Intravenous Once  . DISCONTD: amLODipine-benazepril  1 capsule Oral Daily   Continuous:  WUJ:[id number], acetaminophen, ALPRAZolam, HYDROcodone-acetaminophen, loperamide, nitroGLYCERIN, ondansetron (ZOFRAN) IV, ondansetron  Assesment: She has chronic pain. She had change in mental status thought to be due to accumulation of her medications. She is better but complaining of pain. She has hydrocodone available. I do not think I will start her back on a fentanyl patch at this point. We don't have any definite other problem that might have caused her change in mental status and she did respond to Narcan Active Problems:  * No active hospital problems. *     Plan: Continue with current treatments. No change in meds.    LOS: 1 day   Mickelle Goupil L 05/12/2012, 9:58 AM

## 2012-05-12 NOTE — Progress Notes (Signed)
Received pt back from Dept 300 due to pt's heart rhythm going into a-fib with RVR, HR in 170's per nurse.  When nurse from floor called to ask if pt had been in a-fib while on the unit, she was informed that pt had been 1st degree AVB and ST but no a-fib despite the fact that pt had a history of proximal a-fib.   Dr. Sudie Bailey was paged and requested pt be brought back to ICU and placed on Cardizem drip.  Once pt was hooked up to ICU bedside monitors, it was noted that she was in sinus tach with HR of 111.    Paged Dr. Sudie Bailey and informed him.  Will start on PO Cardizem instead of placing her on the drip and will keep in ICU overnight to monitor as stepdown pt.

## 2012-05-12 NOTE — Plan of Care (Signed)
Problem: Phase I Progression Outcomes Goal: Voiding-avoid urinary catheter unless indicated Outcome: Not Met (add Reason) #6f foley placed d/t inability to void

## 2012-05-12 NOTE — Progress Notes (Signed)
Report given to [facility], RN on Dept 300.  Pt is alert and oriented to self and place.  VSS.  Pt's son informed of transfer to room 323.  Pt taken to room 323 via bed and placed on telemetry.

## 2012-05-13 LAB — URINALYSIS, ROUTINE W REFLEX MICROSCOPIC
Nitrite: POSITIVE — AB
Specific Gravity, Urine: 1.015 (ref 1.005–1.030)
pH: 6 (ref 5.0–8.0)

## 2012-05-13 LAB — URINE MICROSCOPIC-ADD ON

## 2012-05-13 MED ORDER — CIPROFLOXACIN HCL 250 MG PO TABS
250.0000 mg | ORAL_TABLET | Freq: Two times a day (BID) | ORAL | Status: DC
  Administered 2012-05-13 – 2012-05-16 (×7): 250 mg via ORAL
  Filled 2012-05-13 (×7): qty 1

## 2012-05-13 NOTE — Progress Notes (Signed)
Subjective: The events of yesterday are noted. She developed atrial fibrillation with rapid ventricular response but this actually resolved spontaneously before it could be treated. She's complaining of some dysuria. She has no other new complaints but is still complaining of pain.  Objective: Vital signs in last 24 hours: Temp:  [97.5 F (36.4 C)-98.2 F (36.8 C)] 98.2 F (36.8 C) (06/30 0800) Pulse Rate:  [55-174] 70  (06/30 0800) Resp:  [15-29] 17  (06/30 0800) BP: (102-151)/(45-87) 144/66 mmHg (06/30 0800) SpO2:  [93 %-98 %] 96 % (06/30 0800) Weight:  [47.8 kg (105 lb 6.1 oz)] 47.8 kg (105 lb 6.1 oz) (06/30 0600) Weight change: 2.3 kg (5 lb 1.1 oz) Last BM Date: 05/12/12  Intake/Output from previous day: 06/29 0701 - 06/30 0700 In: 2170 [P.O.:120; I.V.:1575] Out: 652 [Urine:650; Stool:2]  PHYSICAL EXAM General appearance: alert, cooperative and moderate distress Resp: clear to auscultation bilaterally Cardio: regular rate and rhythm, S1, S2 normal, no murmur, click, rub or gallop GI: soft, non-tender; bowel sounds normal; no masses,  no organomegaly Extremities: extremities normal, atraumatic, no cyanosis or edema  Lab Results:    Basic Metabolic Panel:  Basename 05/11/12 1026  NA 139  K 3.7  CL 101  CO2 27  GLUCOSE 92  BUN 16  CREATININE 0.93  CALCIUM 9.6  MG --  PHOS --   Liver Function Tests:  Basename 05/11/12 1026  AST 18  ALT 9  ALKPHOS 71  BILITOT 0.5  PROT 7.1  ALBUMIN 3.6   No results found for this basename: LIPASE:2,AMYLASE:2 in the last 72 hours No results found for this basename: AMMONIA:2 in the last 72 hours CBC:  Basename 05/11/12 1026  WBC 6.6  NEUTROABS 2.5  HGB 15.3*  HCT 45.9  MCV 93.9  PLT 184   Cardiac Enzymes:  Basename 05/11/12 1026  CKTOTAL 43  CKMB 2.1  CKMBINDEX --  TROPONINI <0.30   BNP: No results found for this basename: PROBNP:3 in the last 72 hours D-Dimer: No results found for this basename: DDIMER:2  in the last 72 hours CBG: No results found for this basename: GLUCAP:6 in the last 72 hours Hemoglobin A1C: No results found for this basename: HGBA1C in the last 72 hours Fasting Lipid Panel: No results found for this basename: CHOL,HDL,LDLCALC,TRIG,CHOLHDL,LDLDIRECT in the last 72 hours Thyroid Function Tests: No results found for this basename: TSH,T4TOTAL,FREET4,T3FREE,THYROIDAB in the last 72 hours Anemia Panel: No results found for this basename: VITAMINB12,FOLATE,FERRITIN,TIBC,IRON,RETICCTPCT in the last 72 hours Coagulation: No results found for this basename: LABPROT:2,INR:2 in the last 72 hours Urine Drug Screen: Drugs of Abuse  No results found for this basename: labopia, cocainscrnur, labbenz, amphetmu, thcu, labbarb    Alcohol Level: No results found for this basename: ETH:2 in the last 72 hours Urinalysis:  Basename 05/11/12 1203  COLORURINE YELLOW  LABSPEC 1.010  PHURINE 7.5  GLUCOSEU NEGATIVE  HGBUR TRACE*  BILIRUBINUR NEGATIVE  KETONESUR NEGATIVE  PROTEINUR NEGATIVE  UROBILINOGEN 0.2  NITRITE NEGATIVE  LEUKOCYTESUR NEGATIVE   Misc. Labs:  ABGS  Basename 05/11/12 1525  PHART 7.467*  PO2ART 81.7  TCO2 21.3  HCO3 25.4*   CULTURES Recent Results (from the past 240 hour(s))  MRSA PCR SCREENING     Status: Abnormal   Collection Time   05/11/12  3:33 PM      Component Value Range Status Comment   MRSA by PCR POSITIVE (*) NEGATIVE Final    Studies/Results: Dg Chest 1 View  05/11/2012  *RADIOLOGY REPORT*  Clinical Data: Altered mental status.  CHEST - 1 VIEW  Comparison: 01/20/2012  Findings: Chronic changes in the upper lobes compatible with scarring.  Changes of COPD.  Heart is upper limits normal in size. Cardiac stent noted.  No effusions or acute bony abnormality. Degenerative changes in the shoulders.  IMPRESSION: COPD/chronic changes.  No active disease.  Original Report Authenticated By: Cyndie Chime, M.D.   Dg Cervical Spine  Complete  05/12/2012  *RADIOLOGY REPORT*  Clinical Data: Severe pain in the cervical spine.  CERVICAL SPINE - COMPLETE 4+ VIEW  Comparison: No priors.  Findings: No dietary view is nondiagnostic.  The lateral view only displays a cervical spine from the base of the skull to the inferior endplate of C6 (C7 and the cervicothoracic junction are incompletely visualized).  With these limitations in mind, there is no definite acute displaced fracture or acute malalignment of the cervical spine.  Prevertebral soft tissues are normal.  Bones are osteopenic.  There is multilevel loss of intervertebral disc height with associated facet heart arthropathy.  Heterotopic ossification in the posterior paraspinal soft tissues as likely relates to remote spinal trauma.  IMPRESSION: 1.  Very limited examination demonstrating no definite acute radiographic abnormality of the cervical spine.  If there is clinical concern for odontoid fracture or acute traumatic injury at the level of C7 or C7-T1, further evaluation with CT of the cervical spine may be warranted. 2.  Severe multilevel degenerative disc disease and cervical spondylosis, as above.  Original Report Authenticated By: Florencia Reasons, M.D.   Ct Head Wo Contrast  05/11/2012  *RADIOLOGY REPORT*  Clinical Data: Altered mental status  CT HEAD WITHOUT CONTRAST  Technique:  Contiguous axial images were obtained from the base of the skull through the vertex without contrast.  Comparison: CT 09/11/2010  Findings: The patient would not hold still and there is motion on the study.  Multiple images were repeated.  Moderate to advanced atrophy.  Chronic microvascular ischemia in the white matter.  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  Negative for acute infarct.  Negative for hemorrhage or mass. Atherosclerotic disease is present.  IMPRESSION: Atrophy and chronic ischemic change.  No acute abnormality.  Original Report Authenticated By: Camelia Phenes, M.D.   Mr Brain Wo  Contrast  05/11/2012  *RADIOLOGY REPORT*  Clinical Data: Mental status changes.  Disorientation.  MRI HEAD WITHOUT CONTRAST  Technique:  Multiplanar, multiecho pulse sequences of the brain and surrounding structures were obtained according to standard protocol without intravenous contrast.  Comparison: Head CT same day  Findings: The study suffers from motion degradation.  Diffusion imaging does not show any acute or subacute infarction.  The brainstem and cerebellum do not show any focal insult.  The cerebral hemispheres show extensive atrophy and chronic small vessel disease affecting the thalami, basal ganglia and hemispheric deep white matter.  No cortical or large vessel territory infarction.  No mass lesion, hemorrhage, hydrocephalus or extra- axial collection.  There is an insignificant 5 mm lipoma along the anterior falx.  No pituitary mass.  No skull or skull base lesion.  IMPRESSION: Atrophy extensive chronic small vessel disease.  No acute or reversible findings.  Original Report Authenticated By: Thomasenia Sales, M.D.    Medications:  Scheduled:   . sodium chloride   Intravenous STAT  . amLODipine  5 mg Oral Daily   And  . benazepril  20 mg Oral Daily  . aspirin EC  81 mg Oral Daily  . calcium-vitamin  D  1 tablet Oral Daily  . Chlorhexidine Gluconate Cloth  6 each Topical Q0600  . ciprofloxacin  250 mg Oral BID  . diltiazem  60 mg Oral Q6H  . enoxaparin (LOVENOX) injection  30 mg Subcutaneous Q24H  . multivitamin with minerals  1 tablet Oral Daily  . mupirocin ointment  1 application Nasal BID  . pantoprazole  40 mg Oral Q1200  . potassium chloride SA  20 mEq Oral Daily  . senna  2 tablet Oral Daily   Continuous:  QIH:[id number], acetaminophen, ALPRAZolam, HYDROcodone-acetaminophen, loperamide, nitroGLYCERIN, ondansetron, DISCONTD: HYDROcodone-acetaminophen  Assesment: She has chronic pain. She was admitted with change in mental status it was thought to be due to her pain  medications. She has had an episode of atrial fib with rapid ventricular response which resolved spontaneously. She is now on Cardizem by mouth. Because of the fact that she's on Cardizem I am going to discontinue amlodipine Active Problems:  * No active hospital problems. *     Plan: Transfer back out of the step down unit on Cardizem continue with her treatments. Dr. Felecia Shelling will assume her care in the morning    LOS: 2 days   Chrisann Melaragno L 05/13/2012, 9:05 AM

## 2012-05-13 NOTE — Progress Notes (Signed)
Report called to Candelaria Stagers, RN on Dept 300.  Pt is alert and oriented to self; pt taken to room 338 via bed.  Belongings also taken with pt to new room.

## 2012-05-13 NOTE — Progress Notes (Signed)
Clinical Social Work Department BRIEF PSYCHOSOCIAL ASSESSMENT 05/13/2012  Patient:  [name], [name]     Account Number:  [id number]     Admit date:  05/11/2012  Clinical Social Worker:  Larene Beach  Date/Time:  05/13/2012 11:00 AM  Referred by:  Physician  Date Referred:  05/12/2012 Referred for  ALF Placement/From Facitliy    Interview type:  Family Other interview type:   Chart review    PSYCHOSOCIAL DATA Living Status:  FACILITY Admitted from facility:   HOUSE OF [place] Level of care:  Assisted Living Primary support name:  Tim Wyche Primary support relationship to patient:  CHILD, ADULT Degree of support available:   Pt's son provides positve support.    CURRENT CONCERNS Current Concerns  Post-Acute Placement   Other Concerns:    SOCIAL WORK ASSESSMENT / PLAN Clinical Social Worker consulted for ALF placement.  Pt was admitted from [facility] memory care program.  CSW spoke with pt's son and daughter in-law to offer support with discharge plans.  Pt's son requested the pt return to [facility] when she is ready for discharge.  Pt's son and daughter in-law informed CSW that they spoke with [facility] on 05/12/12 and the plan was for the pt to return.  CSW contacted [place] to confirm.  CSW completed FL2 and faxed along with H&P and current meds to the facility.   Assessment/plan status:  Psychosocial Support/Ongoing Assessment of Needs Other assessment/ plan:   Continued support with discharge plans      PATIENT'S/FAMILY'S RESPONSE TO PLAN OF CARE: Pt's son and daughter in-law were appriciative of CSW support, and feel the best discharge plan will be for the pt to return to [facility].   Weekday CSW to follow.   Tamala Julian, MSW, LCSW Clinical Social Worker Weekend Coverage

## 2012-05-14 NOTE — Clinical Social Work Note (Signed)
[facility] ED confirmed facility is agreeable to accept patient back at discharge.  Please keep them informed of her progress.    Clovis Cao Clinical Social Worker [phone])

## 2012-05-14 NOTE — Progress Notes (Signed)
[name], [name]           ACCOUNT NO.:  [id number]  MEDICAL RECORD NO.:  [id number]  LOCATION:  A338                          FACILITY:  [facility]  PHYSICIAN:  Edon Hoadley D. Felecia Shelling, MD   DATE OF BIRTH:  1917-08-28  DATE OF PROCEDURE:  05/14/2012 DATE OF DISCHARGE:                                PROGRESS NOTE   SUBJECTIVE:  The patient complains back pain.  She was admitted with change in mental status.  The patient is currently getting treatment for urinary tract infection.  OBJECTIVE:  GENERAL:  The patient is alert, awake, and chronically sick looking and confused. VITAL SIGNS:  Blood pressure 115/54, pulse 55, respiratory rate 16, temperature 98.1 degrees Fahrenheit. CHEST:  Decreased air entry, few rhonchi.  CARDIOVASCULAR:  First and second heart sound heard.  No murmur.  No gallop. ABDOMEN:  Soft and lax.  Bowel sound is positive.  No mass or organomegaly. EXTREMITIES:  No leg edema.  ASSESSMENT: 1. Change in mental status secondary to multifactorial causes. 2. Urinary tract infection. 3. Chronic pain syndrome. 4. History of chronic atrial fibrillation. 5. Degenerative joint disease.  PLAN:  We will continue the patient on oral antibiotics.  We will do physical therapy for ambulation.  We will do CBC, BNP.  Continue her regular medications.     Dazani Norby D. Felecia Shelling, MD     TDF/MEDQ  D:  05/14/2012  T:  05/14/2012  Job:  [id number]

## 2012-05-14 NOTE — Evaluation (Signed)
Physical Therapy Evaluation Patient Details Name: [name] MRN: [id number] DOB: 04/11/1917 Today's Date: 05/14/2012 Time: 4098-1191 PT Time Calculation (min): 25 min  PT Assessment / Plan / Recommendation Clinical Impression  Pt from assistive living and is able to function at this level.  Pt does not need skilled PT but will need nursing staff to ambuate with rolling walker.    PT Assessment  Patent does not need any further PT services    Follow Up Recommendations  none                            Precautions / Restrictions Precautions Precautions: Fall         Mobility  Bed Mobility Bed Mobility: Sit to Supine Sit to Supine: 3: Mod assist Transfers Transfers: Sit to Stand;Stand to Sit;Stand Pivot Transfers Sit to Stand: 4: Min assist Stand to Sit: 5: Supervision Stand Pivot Transfers: 5: Supervision Ambulation/Gait Ambulation/Gait Assistance: 5: Supervision Ambulation Distance (Feet): 120 Feet Assistive device: Rolling walker Gait Pattern: Within Functional Limits Gait velocity: slow    Exercises     PT Diagnosis:   Pt functional for assistive living facility  PT Goals Acute Rehab PT Goals PT Goal Formulation: With patient  Visit Information  Last PT Received On: 05/14/12    Subjective Data  Subjective: I use a walker to walk with. Patient Stated Goal: I hope to go back to where I live tomorrow   Prior Functioning  Home Living Lives With: Other (Comment) (assistive living) Available Help at Discharge: Available 24 hours/day Type of Home: Assisted living Home Access: Level entry Home Layout: One level Home Adaptive Equipment: Walker - rolling Prior Function Level of Independence: Needs assistance Vocation: Retired Musician: No difficulties Dominant Hand: Right    Cognition  Overall Cognitive Status: Appears within functional limits for tasks assessed/performed Arousal/Alertness: Awake/alert Orientation Level:  Appears intact for tasks assessed Behavior During Session: [facility] for tasks performed    Extremity/Trunk Assessment Right Lower Extremity Assessment RLE ROM/Strength/Tone: [facility] for tasks assessed Left Lower Extremity Assessment LLE ROM/Strength/Tone: [facility] for tasks assessed   Balance    End of Session PT - End of Session Equipment Utilized During Treatment: Gait belt Activity Tolerance: Patient tolerated treatment well Patient left: in chair;with chair alarm set Nurse Communication: Mobility status  GP     [name],CINDY 05/14/2012, 5:58 PM

## 2012-05-15 LAB — BASIC METABOLIC PANEL
BUN: 11 mg/dL (ref 6–23)
Calcium: 9.2 mg/dL (ref 8.4–10.5)
GFR calc non Af Amer: 71 mL/min — ABNORMAL LOW (ref >90)
Glucose, Bld: 94 mg/dL (ref 70–99)
Sodium: 140 mEq/L (ref 135–145)

## 2012-05-15 LAB — CBC
Hemoglobin: 13.6 g/dL (ref 12.0–15.0)
MCH: 30.8 pg (ref 26.0–34.0)
MCHC: 33.3 g/dL (ref 30.0–36.0)

## 2012-05-15 NOTE — Clinical Social Work Note (Signed)
CSW spoke to Dr. Felecia Shelling to follow up after CM reports MD planned to d/c pt today.  Per MD, unable to complete d/c summary and medication list until 4:30-5:00.  Facility states they require FL2 completed with d/c medications by 4:30 in order to have pharmacy order prescriptions.  MD notified.  MD felt pt was weak, but PT note indicates at baseline.  Derenda Fennel, [place] [phone]

## 2012-05-15 NOTE — Progress Notes (Signed)
[name], [name]           ACCOUNT NO.:  [id number]  MEDICAL RECORD NO.:  [id number]  LOCATION:  A338                          FACILITY:  [facility]  PHYSICIAN:  Rashanda Magloire D. Felecia Shelling, MD   DATE OF BIRTH:  1917/09/07  DATE OF PROCEDURE:  05/15/2012 DATE OF DISCHARGE:                                PROGRESS NOTE   SUBJECTIVE:  The patient claims she feels weak and sick.  Complains of pain all over her body.  No fever or chills.  She is getting oral antibiotics.  Physical therapy is trying to work with her.  OBJECTIVE:  GENERAL:  The patient is alert, awake, chronically sick- looking and very fragile. VITAL SIGNS:  Blood pressure 99/59, pulse 74, respiratory rate 18, temperature 97.8 degrees Fahrenheit. CHEST:  Decreased air entry, bilateral rhonchi. CARDIOVASCULAR SYSTEM:  First and second heart sounds heard.  No murmur. No gallop. ABDOMEN:  Soft and lax.  Bowel sound is positive.  No mass or organomegaly. EXTREMITIES:  No leg edema.  LABORATORY DATA:  CBC; WBC 7.8, hemoglobin 13.6, hematocrit 40.8, and platelets 184.  Sodium 140, potassium 3.4, chloride 105, carbon dioxide 26, glucose 94, BUN 11, creatinine 0.76, and calcium 9.2.  Urine culture is growing E. coli greater than 100,000 colonies.  ASSESSMENT: 1. Urinary tract infection. 2. Failure to thrive. 3. Metabolic encephalopathy. 4. Dementia. 5. Degenerative joint disease.  PLAN:  Continue the patient on oral antibiotics.  Continue physical therapy.  Continue current treatment and supportive care.     Lucia Harm D. Felecia Shelling, MD     TDF/MEDQ  D:  05/15/2012  T:  05/15/2012  Job:  [id number]

## 2012-05-15 NOTE — Progress Notes (Signed)
UR Chart Review Completed  

## 2012-05-16 LAB — URINE CULTURE

## 2012-05-16 MED ORDER — CIPROFLOXACIN HCL 250 MG PO TABS: 250.0000 mg | ORAL_TABLET | Freq: Two times a day (BID) | ORAL | Status: DC

## 2012-05-16 MED ORDER — SULFAMETHOXAZOLE-TRIMETHOPRIM 800-160 MG PO TABS: 1.0000 | ORAL_TABLET | Freq: Two times a day (BID) | ORAL | Status: AC

## 2012-05-16 NOTE — Care Management Note (Signed)
    Page 1 of 1   05/16/2012     11:53:38 AM   CARE MANAGEMENT NOTE 05/16/2012  Patient:  [name], [name]   Account Number:  [id number]  Date Initiated:  05/16/2012  Documentation initiated by:  Sharrie Rothman  Subjective/Objective Assessment:   Pt admitted from [facility] with UTI. Pt will return at discharge.     Action/Plan:   No CM needs noted. CSW is aware and will arrange discharge to facility.   Anticipated DC Date:  05/16/2012   Anticipated DC Plan:  ASSISTED LIVING / REST HOME  In-house referral  Clinical Social Worker      DC Planning Services  CM consult      Choice offered to / List presented to:             Status of service:  Completed, signed off Medicare Important Message given?   (If response is "NO", the following Medicare IM given date fields will be blank) Date Medicare IM given:   Date Additional Medicare IM given:    Discharge Disposition:  ASSISTED LIVING  Per UR Regulation:    If discussed at Long Length of Stay Meetings, dates discussed:    Comments:  05/16/12 1152 Arlyss Queen, RN BSN CM Pt discharged to [facility] today. CSW will arrange discharge to facility.

## 2012-05-16 NOTE — Clinical Social Work Note (Signed)
Pt d/c today by MD to [facility].  Pt not oriented.  Facility and pt's son aware and agreeable.  Pt to transfer via facility van.  Per [facility] at [facility], script not necessary for Septra.  D/C summary and FL2 faxed.  Arlyss Queen, RN reviewed [facility] for accuracy.   Derenda Fennel, [place] [phone]

## 2012-05-16 NOTE — Progress Notes (Signed)
Discharge instructions given to caretaker from [facility].  Pt is ready for discharge to [facility].  Out via W/c to awaiting vehicle.

## 2012-05-16 NOTE — Discharge Summary (Signed)
NAMEFERNANDO, [name]           ACCOUNT NO.:  [id number]  MEDICAL RECORD NO.:  [id number]  LOCATION:  A338                          FACILITY:  [facility]  PHYSICIAN:  Terri Malerba D. Felecia Shelling, MD   DATE OF BIRTH:  07-03-1917  DATE OF ADMISSION:  05/11/2012 DATE OF DISCHARGE:  07/03/2013LH                              DISCHARGE SUMMARY   DISCHARGE DIAGNOSES: 1. Change in mental status, probably secondary to medications. 2. Urinary tract infection. 3. Dementia. 4. Hypertension. 5. Coronary artery disease. 6. Gastroesophageal reflux disease. 7. History of carcinoma of the breast. 8. Fracture of the hip. 9. Chronic pain syndrome.  DISCHARGE MEDICATIONS: 1. Septra DS 1 tablet p.o. b.i.d. for 5 days. 2. Acetaminophen 500 mg q.6 h. p.r.n. 3. Xanax 0.25 mg b.i.d. 4. Lotrel 5/20, 1 tablet daily. 5. Aspirin 81 mg daily. 6. Folic acid with vitamin D 1 tablet daily. 7. Vicodin 1 tablet q.6 hours p.r.n. 8. Imodium 2 mg p.r.n. 9. Nitroglycerin 0.4 mg sublingual p.r.n. for chest pain. 10.Zofran 4 mg q.6 hours p.r.n. 11.Oyster shell with calcium 1 tablet daily. 12.Protonix 40 mg daily. 13.KCl 20 mEq daily. 14.Senna 2 tablets daily p.r.n.  DISPOSITION:  The patient will be discharged to [facility].  LABS ON DISCHARGE:  The patient is growing E. coli in her urine which is resistant to amoxicillin and Cipro.  CBC:  WBC 7.8, hemoglobin 13.6, hematocrit 40.8, and platelets 184.  BMP:  Sodium 140, potassium 3.4, chloride 105, carbon dioxide 26, glucose 94, BUN 11, creatinine 0.7, calcium 9.2.  DISCHARGE INSTRUCTIONS:  The patient will continue her oral antibiotics. She will be seen in the office in 1 week duration.  HOSPITAL COURSE:  This is a 78 year old female patient with history of multiple medical illnesses was admitted due to change in mental status. She was a resident of local assisted living.  She has been on Duragesic patch and hydrocodone for pain.  The patient was admitted with  change in mental status and this was suspected to be due to her opioid pain medications.  The patient was admitted and continued on treatment.  Her fentanyl patch is discontinued at this time.  She also grew E. coli in her urine.  The patient is on oral antibiotics.  She will be discharged back to [facility] and will be followed in the office.     Tyleigh Mahn D. Felecia Shelling, MD     TDF/MEDQ  D:  05/16/2012  T:  05/16/2012  Job:  [id number]

## 2012-06-08 ENCOUNTER — Encounter (HOSPITAL_COMMUNITY): Payer: Self-pay | Admitting: *Deleted

## 2012-06-08 ENCOUNTER — Emergency Department (HOSPITAL_COMMUNITY): Payer: Medicare Other

## 2012-06-08 ENCOUNTER — Emergency Department (HOSPITAL_COMMUNITY)
Admission: EM | Admit: 2012-06-08 | Discharge: 2012-06-08 | Disposition: A | Discharge status: Skilled Nursing Facility | Payer: Medicare Other | Attending: Emergency Medicine | Admitting: Emergency Medicine

## 2012-06-08 DIAGNOSIS — I4891 Unspecified atrial fibrillation: Secondary | ICD-10-CM | POA: Insufficient documentation

## 2012-06-08 DIAGNOSIS — F341 Dysthymic disorder: Secondary | ICD-10-CM | POA: Insufficient documentation

## 2012-06-08 DIAGNOSIS — Z853 Personal history of malignant neoplasm of breast: Secondary | ICD-10-CM | POA: Insufficient documentation

## 2012-06-08 DIAGNOSIS — F0390 Unspecified dementia without behavioral disturbance: Secondary | ICD-10-CM | POA: Insufficient documentation

## 2012-06-08 DIAGNOSIS — I10 Essential (primary) hypertension: Secondary | ICD-10-CM | POA: Insufficient documentation

## 2012-06-08 DIAGNOSIS — Z79899 Other long term (current) drug therapy: Secondary | ICD-10-CM | POA: Insufficient documentation

## 2012-06-08 DIAGNOSIS — I2510 Atherosclerotic heart disease of native coronary artery without angina pectoris: Secondary | ICD-10-CM | POA: Insufficient documentation

## 2012-06-08 DIAGNOSIS — N390 Urinary tract infection, site not specified: Secondary | ICD-10-CM | POA: Insufficient documentation

## 2012-06-08 HISTORY — DX: Urinary tract infection, site not specified: N39.0

## 2012-06-08 HISTORY — DX: Interstitial pulmonary disease, unspecified: J84.9

## 2012-06-08 HISTORY — DX: Noninfective gastroenteritis and colitis, unspecified: K52.9

## 2012-06-08 HISTORY — DX: Mucopurulent chronic bronchitis: J41.1

## 2012-06-08 LAB — URINALYSIS, ROUTINE W REFLEX MICROSCOPIC
Bilirubin Urine: NEGATIVE
Nitrite: POSITIVE — AB
Specific Gravity, Urine: 1.015 (ref 1.005–1.030)
pH: 5.5 (ref 5.0–8.0)

## 2012-06-08 LAB — CBC WITH DIFFERENTIAL/PLATELET
Basophils Absolute: 0 10*3/uL (ref 0.0–0.1)
Basophils Relative: 0 % (ref 0–1)
Eosinophils Absolute: 0.1 10*3/uL (ref 0.0–0.7)
MCH: 31.8 pg (ref 26.0–34.0)
MCHC: 34.3 g/dL (ref 30.0–36.0)
Neutrophils Relative %: 57 % (ref 43–77)
Platelets: 182 10*3/uL (ref 150–400)

## 2012-06-08 LAB — COMPREHENSIVE METABOLIC PANEL
ALT: 11 U/L (ref 0–35)
Albumin: 3.3 g/dL — ABNORMAL LOW (ref 3.5–5.2)
Alkaline Phosphatase: 71 U/L (ref 39–117)
Potassium: 3.7 mEq/L (ref 3.5–5.1)
Sodium: 137 mEq/L (ref 135–145)
Total Protein: 6.5 g/dL (ref 6.0–8.3)

## 2012-06-08 LAB — URINE MICROSCOPIC-ADD ON

## 2012-06-08 LAB — LIPASE, BLOOD: Lipase: 69 U/L — ABNORMAL HIGH (ref 11–59)

## 2012-06-08 LAB — LACTIC ACID, PLASMA: Lactic Acid, Venous: 1.2 mmol/L (ref 0.5–2.2)

## 2012-06-08 MED ORDER — FENTANYL CITRATE 0.05 MG/ML IJ SOLN: 50.0000 ug | Freq: Once | INTRAMUSCULAR | Status: DC

## 2012-06-08 MED ORDER — CIPROFLOXACIN HCL 250 MG PO TABS
500.0000 mg | ORAL_TABLET | Freq: Once | ORAL | Status: AC
  Administered 2012-06-08: 500 mg via ORAL
  Filled 2012-06-08: qty 2

## 2012-06-08 MED ORDER — CIPROFLOXACIN HCL 500 MG PO TABS: 500.0000 mg | ORAL_TABLET | Freq: Two times a day (BID) | ORAL | Status: AC

## 2012-06-08 MED ORDER — FENTANYL CITRATE 0.05 MG/ML IJ SOLN
50.0000 ug | Freq: Once | INTRAMUSCULAR | Status: AC
  Administered 2012-06-08: 50 ug via NASAL
  Filled 2012-06-08: qty 2

## 2012-06-08 MED ORDER — ONDANSETRON HCL 4 MG/2ML IJ SOLN: 4.0000 mg | Freq: Once | INTRAMUSCULAR | Status: DC

## 2012-06-08 MED ORDER — SODIUM CHLORIDE 0.9 % IV BOLUS (SEPSIS): 500.0000 mL | Freq: Once | INTRAVENOUS | Status: DC

## 2012-06-08 NOTE — ED Notes (Signed)
Patient with no complaints at this time. Respirations even and unlabored. Skin warm/dry. Discharge instructions reviewed with Boneta Lucks, [place] at [facility] house at this time. Patient discharged at this time and left Emergency Department in care of [facility], transport personnel from [facility].

## 2012-06-08 NOTE — ED Notes (Signed)
Patient gave verbal consent for son and daughter to have access to medical information via phone or in person.

## 2012-06-08 NOTE — ED Notes (Signed)
Pt resident at [facility] - EMS called out today for intermittent abd pain "for a while."  Family wants pt to be evaluated.  Denies n/v/d.  Pt alert and oriented x 2, which is baseline.

## 2012-06-08 NOTE — ED Provider Notes (Signed)
History   This chart was scribed for Morgan Mcgroarty B. Bernette [name], MD by Shari Heritage. The patient was seen in room APA11/APA11. Patient's care was started at 1534.     CSN: [id number]  Arrival date & time 06/08/12  1534   First MD Initiated Contact with Patient 06/08/12 1536      Chief Complaint  Patient presents with  . Abdominal Pain    (Consider location/radiation/quality/duration/timing/severity/associated sxs/prior treatment) HPI [name] is a 78 y.o. female who presents to the Emergency Department complaining of persistent, moderate to severe, right-sided abdominal pain onset 1 day ago. Patient states that the pain started in her lower abdomen yesterday and moved upwards. Associated symptoms include nausea, loose stool and dysuria. Patient denies vomiting. Patient with medical h/o carcinoma of breast, paroxysmal a-fib, HTN, anxiety, ASCVD, dementia, GERD, gout, and left hip fracture. Surgical history includes ORIF, cataract extraction and mastectomy. Patient is a resident at [facility] and was transported to the ED by EMS after family requested that she be evaluated.    Past Medical History  Diagnosis Date  . Carcinoma of breast 2008    Right mastectomy  . Paroxysmal atrial fibrillation   . Hypertension   . Anxiety   . Arteriosclerotic cardiovascular disease (ASCVD)     Multiple prior PCI, most recently in 2007  . Dementia   . Gastroesophageal reflux disease   . Gout   . Hip fracture, left 2011  . Chest pain   . Acute gastroenteritis   . Interstitial pulmonary disease   . Bronchitis, mucopurulent recurrent   . Anxiety   . Depression   . CAD (coronary artery disease)   . UTI (lower urinary tract infection)     Past Surgical History  Procedure Date  . Orif hip fracture 2011  . Cataract extraction   . Mastectomy 2008    Right    No family history on file.  History  Substance Use Topics  . Smoking status: Never Smoker   . Smokeless tobacco: Never Used   . Alcohol Use: No    OB History    Grav Para Term Preterm Abortions TAB SAB Ect Mult Living                  Review of Systems A complete 10 system review of systems was obtained and all systems are negative except as noted in the HPI and PMH.   Allergies  Codeine; Penicillins; and Sulfa antibiotics  Home Medications   Current Outpatient Rx  Name Route Sig Dispense Refill  . ACETAMINOPHEN 500 MG PO TABS Oral Take 500 mg by mouth every 6 (six) hours as needed. For pain     . ALPRAZOLAM 0.25 MG PO TABS Oral Take 0.25 mg by mouth 2 (two) times daily as needed. For agitation and/or anxiety     . AMLODIPINE BESY-BENAZEPRIL HCL 5-20 MG PO CAPS Oral Take 1 capsule by mouth daily.      . ASPIRIN EC 81 MG PO TBEC Oral Take 81 mg by mouth daily.      Marland Kitchen CALCIUM CARB-CHOLECALCIFEROL 500-400 MG-UNIT PO TABS Oral Take 1 tablet by mouth daily.      Marland Kitchen DIALYVITE 3000 3 MG PO TABS Oral Take 1 tablet by mouth daily.      Marland Kitchen HYDROCODONE-ACETAMINOPHEN 5-500 MG PO TABS Oral Take 1 tablet by mouth every 6 (six) hours as needed. For pain    . LOPERAMIDE HCL 2 MG PO CAPS Oral Take 2 mg by mouth  daily as needed. For Diarrhea    . NITROGLYCERIN 0.4 MG SL SUBL Sublingual Place 0.4 mg under the tongue every 5 (five) minutes x 3 doses as needed.    Marland Kitchen ONDANSETRON HCL 4 MG PO TABS Oral Take 4 mg by mouth every 6 (six) hours as needed. For nausea/vomiting    . PANTOPRAZOLE SODIUM 40 MG PO TBEC Oral Take 40 mg by mouth daily.      Marland Kitchen POTASSIUM CHLORIDE CRYS ER 20 MEQ PO TBCR Oral Take 20 mEq by mouth daily.      . SENNA 8.6 MG PO TABS Oral Take 2 tablets by mouth daily.      BP 107/57  Pulse 75  Temp 98.8 F (37.1 C) (Oral)  Resp 20  Ht 5' (1.524 m)  Wt 105 lb (47.628 kg)  BMI 20.51 kg/m2  SpO2 96%  Physical Exam  Nursing note and vitals reviewed. Constitutional: She is oriented to person, place, and time. She appears well-developed and well-nourished.  HENT:  Head: Normocephalic and atraumatic.    Eyes: EOM are normal. Pupils are equal, round, and reactive to light.  Neck: Normal range of motion. Neck supple.  Cardiovascular: Normal rate, normal heart sounds and intact distal pulses.   Pulmonary/Chest: Effort normal and breath sounds normal.  Abdominal: Bowel sounds are normal. She exhibits no distension. There is tenderness (Tenderness over bladder, RUQ and RLQ) in the right upper quadrant and right lower quadrant. There is no guarding.  Musculoskeletal: Normal range of motion. She exhibits no edema and no tenderness.  Neurological: She is alert and oriented to person, place, and time. She has normal strength. No cranial nerve deficit or sensory deficit.  Skin: Skin is warm and dry. No rash noted.  Psychiatric: She has a normal mood and affect.    ED Course  Procedures (including critical care time) DIAGNOSTIC STUDIES: Oxygen Saturation is 96% on room air, normal by my interpretation.    COORDINATION OF CARE: 3:38pm- Patient informed of current plan for treatment and evaluation and agrees with plan at this time. Patient has surgical scar on abdomen. Will look for history of abdominal surgery.   Labs Reviewed - No data to display  No results found.   No diagnosis found.    MDM  Pt has midline lower abdominal scar, she is unsure what this procedure was, states she thinks she has had her appendix out, but no mention of this in any of her paperwork or prior charts. She is tender over the RLQ, will send for CT.    Date: 06/08/2012  Rate: 76  Rhythm: normal sinus rhythm  QRS Axis: left  Intervals: normal  ST/T Wave abnormalities: normal  Conduction Disutrbances:none  Narrative Interpretation:   Old EKG Reviewed: unchanged   Nursing unable to get blood or IV access after multiple attempts. I attempted Left EJ unsuccessfully. I was also unable to identify a suitable vein using ultrasound guidance. Will have RT do an arterial stick for blood and proceed with CT without IV  contrast.   6:37 PM CT and labs reviewed. She has a UTI, slight elevation in Lipase may correlate with CT findings, but she is not tender in the epigastrum. Will treat for UTI. Advised close PCP followup and return to ED for any worsening pain.   I personally performed the services described in the documentation, which were scribed in my presence. The recorded information has been reviewed and considered.     Maryjane Benedict B. Bernette [name], MD 06/08/12 Paulo Fruit

## 2012-06-08 NOTE — ED Notes (Signed)
Per Rosey Bath with Lab - Due to multiple unsuccessful IV attempts, not to attempt lab draw, to call RT for blood draw.

## 2012-06-08 NOTE — ED Notes (Signed)
Report called to [place], [place] at [facility]  Patient transported via [facility] transport. Accompanied by Everitt Amber.

## 2012-06-11 LAB — URINE CULTURE

## 2012-06-12 NOTE — ED Notes (Addendum)
Patient is resident at [facility] of positive urine culture faxed to Nursing facility .

## 2012-06-21 ENCOUNTER — Ambulatory Visit (INDEPENDENT_AMBULATORY_CARE_PROVIDER_SITE_OTHER): Payer: Medicare Other | Admitting: Adult Health

## 2012-06-21 ENCOUNTER — Encounter: Payer: Self-pay | Admitting: Adult Health

## 2012-06-21 VITALS — BP 123/69 | HR 78 | Ht 63.0 in | Wt 104.0 lb

## 2012-06-21 DIAGNOSIS — I10 Essential (primary) hypertension: Secondary | ICD-10-CM

## 2012-06-21 DIAGNOSIS — I7090 Unspecified atherosclerosis: Secondary | ICD-10-CM

## 2012-06-21 DIAGNOSIS — I2510 Atherosclerotic heart disease of native coronary artery without angina pectoris: Secondary | ICD-10-CM

## 2012-06-21 DIAGNOSIS — F0390 Unspecified dementia without behavioral disturbance: Secondary | ICD-10-CM

## 2012-06-21 NOTE — Assessment & Plan Note (Signed)
She offers no complaints of chest pain that is out of the ordinary for her. She denies dyspnea on exertion. She uses a walker for ambulation and states that she does her own cooking and cleans up her own area without chest discomfort. She is medically compliant. We will continue to manage her medically for now. She will followup with Dr. Dietrich Pates in 6 months unless she becomes symptomatic.

## 2012-06-21 NOTE — Progress Notes (Signed)
HPI: [name] is a very pleasant 78 year old patient of Dr.  Bing. We are following her for ongoing management of known coronary artery disease, ischemic heart disease with multiple previous PCI as, most recently at [facility] in 2007. She has chronic chest pain syndrome with exacerbations. When last seen by Dr. Dietrich Pates in the office in January of 2013 she was to be treated medically per family wishes. She is currently a resident of assisted living, states she is aware of and can take her medications daily, and offers no complaints. She is hard of hearing and I have to repeat questions, but she appears to be very lucid and aware of her health status. She is accompanied by a staff member from skilled nursing facility.  Allergies  Allergen Reactions  . Codeine Other (See Comments)    unknown  . Penicillins Other (See Comments)    unknown  . Sulfa Antibiotics Other (See Comments)    unknown    Current Outpatient Prescriptions  Medication Sig Dispense Refill  . acetaminophen (TYLENOL) 500 MG tablet Take 500 mg by mouth every 6 (six) hours as needed. For pain       . ALPRAZolam (XANAX) 0.25 MG tablet Take 0.25 mg by mouth 2 (two) times daily as needed. For agitation and/or anxiety       . amLODipine-benazepril (LOTREL) 5-20 MG per capsule Take 1 capsule by mouth daily.        Marland Kitchen aspirin EC 81 MG tablet Take 81 mg by mouth daily.        . Calcium Carb-Cholecalciferol (OYSTER SHELL CALCIUM + D) 500-400 MG-UNIT TABS Take 1 tablet by mouth daily.        . clopidogrel (PLAVIX) 75 MG tablet Take 75 mg by mouth daily.      . ferrous sulfate 325 (65 FE) MG tablet Take 325 mg by mouth 2 (two) times daily.      . furosemide (LASIX) 20 MG tablet Take 20 mg by mouth daily.      Marland Kitchen HYDROcodone-acetaminophen (VICODIN) 5-500 MG per tablet Take 1 tablet by mouth every 6 (six) hours as needed. For pain      . loperamide (IMODIUM) 2 MG capsule Take 2 mg by mouth daily as needed. For Diarrhea      .  metoprolol succinate (TOPROL-XL) 50 MG 24 hr tablet Take 50 mg by mouth every morning. Take with or immediately following a meal.      . Multiple Vitamin (MULTIVITAMIN WITH MINERALS) TABS Take 1 tablet by mouth daily.      . nitroGLYCERIN (NITROSTAT) 0.4 MG SL tablet Place 0.4 mg under the tongue every 5 (five) minutes x 3 doses as needed.      . ondansetron (ZOFRAN) 4 MG tablet Take 4 mg by mouth every 6 (six) hours as needed. For nausea/vomiting      . pantoprazole (PROTONIX) 40 MG tablet Take 40 mg by mouth daily.        . potassium chloride SA (K-DUR,KLOR-CON) 20 MEQ tablet Take 20 mEq by mouth daily.        Marland Kitchen senna (SENOKOT) 8.6 MG TABS Take 2 tablets by mouth daily.      . Vitamin D, Ergocalciferol, (DRISDOL) 50000 UNITS CAPS Take 50,000 Units by mouth every 30 (thirty) days.      Marland Kitchen zinc sulfate (ZINC-220) 220 MG capsule Take 220 mg by mouth daily.        Past Medical History  Diagnosis Date  . Carcinoma  of breast 2008    Right mastectomy  . Paroxysmal atrial fibrillation   . Hypertension   . Anxiety   . Arteriosclerotic cardiovascular disease (ASCVD)     Multiple prior PCI, most recently in 2007  . Dementia   . Gastroesophageal reflux disease   . Gout   . Hip fracture, left 2011  . Chest pain   . Acute gastroenteritis   . Interstitial pulmonary disease   . Bronchitis, mucopurulent recurrent   . Anxiety   . Depression   . CAD (coronary artery disease)   . UTI (lower urinary tract infection)     Past Surgical History  Procedure Date  . Orif hip fracture 2011  . Cataract extraction   . Mastectomy 2008    Right    [id number] of systems complete and found to be negative unless listed above  PHYSICAL EXAM BP 123/69  Pulse 78  Ht 5\' 3"  (1.6 m)  Wt 104 lb (47.174 kg)  BMI 18.42 kg/m2  General: Well developed, well nourished, in no acute distress Head: Eyes PERRLA, No xanthomas.   Normal cephalic and atramatic  Lungs: Clear bilaterally to auscultation and  percussion. Heart: HRRR S1 S2,soft 1/6 systolic murmur..  Pulses are 2+ & equal.            No carotid bruit. No JVD.  No abdominal bruits. No femoral bruits. Abdomen: Bowel sounds are positive, abdomen soft and non-tender without masses or                  Hernia's noted. Msk:  Back normal, normal gait. Normal strength and tone for age. Extremities: No clubbing, cyanosis or edema.  DP +1 Neuro: Alert and oriented X 3. Psych:  Good affect, responds appropriately     ASSESSMENT AND PLAN

## 2012-06-21 NOTE — Assessment & Plan Note (Signed)
This does not appear to be very significant. She is very hard of hearing making it difficult with communication. However she appears lucid compliance and is able to understand her health status. She knows to call his she has any symptoms.

## 2012-06-21 NOTE — Patient Instructions (Addendum)
Your physician wants you to follow-up in: 6 MONTHS You will receive a reminder letter in the mail two months in advance. If you don't receive a letter, please call our office to schedule the follow-up appointment. 

## 2012-06-21 NOTE — Assessment & Plan Note (Signed)
Blood pressure is well-controlled. She will continue her medication regimen as described above. Bovie no changes at this time.

## 2012-12-27 ENCOUNTER — Ambulatory Visit: Payer: Medicare Other | Admitting: Adult Health

## 2013-01-01 ENCOUNTER — Encounter: Payer: Self-pay | Admitting: Adult Health

## 2013-01-01 ENCOUNTER — Ambulatory Visit (INDEPENDENT_AMBULATORY_CARE_PROVIDER_SITE_OTHER): Payer: Medicare Other | Admitting: Adult Health

## 2013-01-01 VITALS — BP 120/64 | HR 84 | Ht 63.0 in | Wt 110.0 lb

## 2013-01-01 DIAGNOSIS — I679 Cerebrovascular disease, unspecified: Secondary | ICD-10-CM

## 2013-01-01 DIAGNOSIS — M1990 Unspecified osteoarthritis, unspecified site: Secondary | ICD-10-CM | POA: Insufficient documentation

## 2013-01-01 DIAGNOSIS — I480 Paroxysmal atrial fibrillation: Secondary | ICD-10-CM

## 2013-01-01 DIAGNOSIS — I10 Essential (primary) hypertension: Secondary | ICD-10-CM

## 2013-01-01 DIAGNOSIS — I4891 Unspecified atrial fibrillation: Secondary | ICD-10-CM

## 2013-01-01 DIAGNOSIS — I359 Nonrheumatic aortic valve disorder, unspecified: Secondary | ICD-10-CM

## 2013-01-01 DIAGNOSIS — M129 Arthropathy, unspecified: Secondary | ICD-10-CM

## 2013-01-01 DIAGNOSIS — I350 Nonrheumatic aortic (valve) stenosis: Secondary | ICD-10-CM

## 2013-01-01 NOTE — Progress Notes (Signed)
HPI: [name] is a very pleasant 89 y/opatient of Dr.Rothbart we are following for ongoing assessment and medical management of CAD, ischemic heart disease, with multiple PCI, hypertension and PAF. She has chronic chest pain syndrome, mild dementia and arthritis.She comes today with no significant complaints with the exception of arthritis pain in her knees and ankles. She is very hard of hearing. She is a resident of [address] and is accompanied by staff member.   Allergies  Allergen Reactions  . Codeine Other (See Comments)    unknown  . Penicillins Other (See Comments)    unknown  . Sulfa Antibiotics Other (See Comments)    unknown    Current Outpatient Prescriptions  Medication Sig Dispense Refill  . acetaminophen (TYLENOL) 500 MG tablet Take 500 mg by mouth every 6 (six) hours as needed. For pain       . amLODipine-benazepril (LOTREL) 5-20 MG per capsule Take 1 capsule by mouth daily.        Marland Kitchen aspirin EC 81 MG tablet Take 81 mg by mouth daily.        . Calcium Carb-Cholecalciferol (OYSTER SHELL CALCIUM + D) 500-400 MG-UNIT TABS Take 1 tablet by mouth daily.        . clopidogrel (PLAVIX) 75 MG tablet Take 75 mg by mouth daily.      . ferrous sulfate 325 (65 FE) MG tablet Take 325 mg by mouth 2 (two) times daily.      . furosemide (LASIX) 20 MG tablet Take 20 mg by mouth daily.      Marland Kitchen HYDROcodone-acetaminophen (NORCO/VICODIN) 5-325 MG per tablet Take 1 tablet by mouth every 6 (six) hours as needed for pain.      Marland Kitchen loperamide (IMODIUM) 2 MG capsule Take 2 mg by mouth daily as needed. For Diarrhea      . megestrol (MEGACE ES) 625 MG/5ML suspension Take 375 mg by mouth daily.      . metoprolol succinate (TOPROL-XL) 50 MG 24 hr tablet Take 50 mg by mouth every morning. Take with or immediately following a meal.      . Multiple Vitamin (MULTIVITAMIN WITH MINERALS) TABS Take 1 tablet by mouth daily.      . nitroGLYCERIN (NITROSTAT) 0.4 MG SL tablet Place 0.4 mg under the tongue  every 5 (five) minutes x 3 doses as needed.      . ondansetron (ZOFRAN) 4 MG tablet Take 4 mg by mouth every 6 (six) hours as needed. For nausea/vomiting      . pantoprazole (PROTONIX) 40 MG tablet Take 40 mg by mouth daily.        . potassium chloride SA (K-DUR,KLOR-CON) 20 MEQ tablet Take 20 mEq by mouth daily.        Marland Kitchen senna (SENOKOT) 8.6 MG TABS Take 2 tablets by mouth daily. May take 2 extra tablets if needed.      . Vitamin D, Ergocalciferol, (DRISDOL) 50000 UNITS CAPS Take 50,000 Units by mouth every 30 (thirty) days.      Marland Kitchen zinc sulfate (ZINC-220) 220 MG capsule Take 220 mg by mouth daily.       No current facility-administered medications for this visit.    Past Medical History  Diagnosis Date  . Carcinoma of breast 2008    Right mastectomy  . Paroxysmal atrial fibrillation   . Hypertension   . Anxiety   . Arteriosclerotic cardiovascular disease (ASCVD)     Multiple prior PCI, most recently in 2007  . Dementia   .  Gastroesophageal reflux disease   . Gout   . Hip fracture, left 2011  . Chest pain   . Acute gastroenteritis   . Interstitial pulmonary disease   . Bronchitis, mucopurulent recurrent   . Anxiety   . Depression   . CAD (coronary artery disease)   . UTI (lower urinary tract infection)     Past Surgical History  Procedure Laterality Date  . Orif hip fracture  2011  . Cataract extraction    . Mastectomy  2008    Right    [id number] of systems complete and found to be negative unless listed above  PHYSICAL EXAM BP 120/64  Pulse 84  Ht 5\' 3"  (1.6 m)  Wt 110 lb (49.896 kg)  BMI 19.49 kg/m2  General: Well developed, well nourished, in no acute distress Head: Eyes PERRLA, No xanthomas.   Normal cephalic and atramatic  Lungs: Clear bilaterally to auscultation and percussion. Heart: HRRR S1 S2, 1/6 systolic mumur.  Pulses are 2+ & equal.            No carotid bruit. No JVD.  No abdominal bruits. No femoral bruits. Abdomen: Bowel sounds are positive,  abdomen soft and non-tender without masses or                  Hernia's noted. Msk:  Back normal, normal gait. Normal strength and tone for age. Extremities: No clubbing, cyanosis or edema. Pain with movement of her legs at the ankles and knees. DP +1 Neuro: Alert and oriented X 3. Psych:  Good affect, responds appropriately  EKG:NSR with anterior and septal Q-waves. Rate of 84 bpm.  ASSESSMENT AND PLAN

## 2013-01-01 NOTE — Assessment & Plan Note (Signed)
Remain on Plavix. No neurologic changes with the exception of chronic [facility]

## 2013-01-01 NOTE — Assessment & Plan Note (Signed)
Asymptomatic and non-limiting. She is not very active.

## 2013-01-01 NOTE — Assessment & Plan Note (Signed)
Excellent control of BP today. Continue current medical regimen.

## 2013-01-01 NOTE — Assessment & Plan Note (Signed)
She is on prn acetaminophen and hydrocodone, but does not know to ask for it. I will change this to BID for 5 days and then return to prn to get her past this initial episode of exacerbation. She will need to be followed by [facility] physician for need for ongoing treatment.

## 2013-01-01 NOTE — Patient Instructions (Addendum)
Your physician recommends that you schedule a follow-up appointment in: 6 months  

## 2013-01-01 NOTE — Assessment & Plan Note (Addendum)
Remains in NSR. Not a candidate for anticoagulation with coumadin or novel anticoagulant but will remain on ASA and Plavix.

## 2013-01-01 NOTE — Progress Notes (Deleted)
Name: [name]    DOB: [birth year]  Age: 79 y.o.  MR#: [id number]       PCP:  Avon Gully, MD      Insurance: Payor: MEDICARE  Plan: MEDICARE PART A AND B  Product Type: *No Product type*    CC:   No chief complaint on file.   VS Filed Vitals:   01/01/13 1412  BP: 120/64  Pulse: 84  Height: 5\' 3"  (1.6 m)  Weight: 110 lb (49.896 kg)    Weights Current Weight  01/01/13 110 lb (49.896 kg)  06/21/12 104 lb (47.174 kg)  06/08/12 105 lb (47.628 kg)    Blood Pressure  BP Readings from Last 3 Encounters:  01/01/13 120/64  06/21/12 123/69  06/08/12 125/62     Admit date:  (Not on file) Last encounter with RMR:  12/27/2012   Allergy Codeine; Penicillins; and Sulfa antibiotics  Current Outpatient Prescriptions  Medication Sig Dispense Refill  . acetaminophen (TYLENOL) 500 MG tablet Take 500 mg by mouth every 6 (six) hours as needed. For pain       . amLODipine-benazepril (LOTREL) 5-20 MG per capsule Take 1 capsule by mouth daily.        Marland Kitchen aspirin EC 81 MG tablet Take 81 mg by mouth daily.        . Calcium Carb-Cholecalciferol (OYSTER SHELL CALCIUM + D) 500-400 MG-UNIT TABS Take 1 tablet by mouth daily.        . clopidogrel (PLAVIX) 75 MG tablet Take 75 mg by mouth daily.      . ferrous sulfate 325 (65 FE) MG tablet Take 325 mg by mouth 2 (two) times daily.      . furosemide (LASIX) 20 MG tablet Take 20 mg by mouth daily.      Marland Kitchen HYDROcodone-acetaminophen (NORCO/VICODIN) 5-325 MG per tablet Take 1 tablet by mouth every 6 (six) hours as needed for pain.      Marland Kitchen loperamide (IMODIUM) 2 MG capsule Take 2 mg by mouth daily as needed. For Diarrhea      . megestrol (MEGACE ES) 625 MG/5ML suspension Take 375 mg by mouth daily.      . metoprolol succinate (TOPROL-XL) 50 MG 24 hr tablet Take 50 mg by mouth every morning. Take with or immediately following a meal.      . Multiple Vitamin (MULTIVITAMIN WITH MINERALS) TABS Take 1 tablet by mouth daily.      . nitroGLYCERIN (NITROSTAT)  0.4 MG SL tablet Place 0.4 mg under the tongue every 5 (five) minutes x 3 doses as needed.      . ondansetron (ZOFRAN) 4 MG tablet Take 4 mg by mouth every 6 (six) hours as needed. For nausea/vomiting      . pantoprazole (PROTONIX) 40 MG tablet Take 40 mg by mouth daily.        . potassium chloride SA (K-DUR,KLOR-CON) 20 MEQ tablet Take 20 mEq by mouth daily.        Marland Kitchen senna (SENOKOT) 8.6 MG TABS Take 2 tablets by mouth daily. May take 2 extra tablets if needed.      . Vitamin D, Ergocalciferol, (DRISDOL) 50000 UNITS CAPS Take 50,000 Units by mouth every 30 (thirty) days.      Marland Kitchen zinc sulfate (ZINC-220) 220 MG capsule Take 220 mg by mouth daily.       No current facility-administered medications for this visit.    Discontinued Meds:    Medications Discontinued During This Encounter  Medication Reason  .  HYDROcodone-acetaminophen (VICODIN) 5-500 MG per tablet Error  . ALPRAZolam (XANAX) 0.25 MG tablet Error    Patient Active Problem List  Diagnosis  . Carcinoma of breast  . Paroxysmal atrial fibrillation  . Hypertension  . Arteriosclerotic cardiovascular disease (ASCVD)  . Dementia  . Gastroesophageal reflux disease  . Aortic stenosis, mild  . Cerebrovascular disease  . Hyperlipidemia  . Dehydration  . Gastroenteritis  . Tachycardia    LABS @BMET3 @  @CMPRESULT3 @ @CBC3 @  Lipid Panel     Component Value Date/Time   CHOL  Value: 123        ATP III CLASSIFICATION:  <200     mg/dL   Desirable  161-096  mg/dL   Borderline High  >=045    mg/dL   High        40/07/8118 0209   TRIG 51 08/20/2010 0209   HDL 50 08/20/2010 0209   CHOLHDL 2.5 08/20/2010 0209   VLDL 10 08/20/2010 0209   LDLCALC  Value: 63        Total Cholesterol/HDL:CHD Risk Coronary Heart Disease Risk Table                     Men   Women  1/2 Average Risk   3.4   3.3  Average Risk       5.0   4.4  2 X Average Risk   9.6   7.1  3 X Average Risk  23.4   11.0        Use the calculated Patient Ratio above and the CHD Risk  Table to determine the patient's CHD Risk.        ATP III CLASSIFICATION (LDL):  <100     mg/dL   Optimal  147-829  mg/dL   Near or Above                    Optimal  130-159  mg/dL   Borderline  562-130  mg/dL   High  >865     mg/dL   Very High 78/02/6961 9528    ABG    Component Value Date/Time   PHART 7.467* 05/11/2012 1525   PCO2ART 35.7 05/11/2012 1525   PO2ART 81.7 05/11/2012 1525   HCO3 25.4* 05/11/2012 1525   TCO2 21.3 05/11/2012 1525   ACIDBASEDEF 2.7* 02/25/2011 1605   O2SAT 96.7 05/11/2012 1525     BNP (last 3 results) No results found for this basename: PROBNP,  in the last 8760 hours Cardiac Panel (last 3 results) No results found for this basename: CKTOTAL, CKMB, TROPONINI, RELINDX,  in the last 72 hours  Iron/TIBC/Ferritin No results found for this basename: iron, tibc, ferritin     EKG Orders placed in visit on 01/01/13  . EKG 12-LEAD     Prior Assessment and Plan Problem List as of 01/01/2013     ICD-9-CM     Cardiology Problems   Paroxysmal atrial fibrillation   Hypertension   Last Assessment & Plan   06/21/2012 Office Visit Written 06/21/2012  1:50 PM by Jodelle Gross, NP     Blood pressure is well-controlled. She will continue her medication regimen as described above. Bovie no changes at this time.    Arteriosclerotic cardiovascular disease (ASCVD)   Last Assessment & Plan   06/21/2012 Office Visit Written 06/21/2012  1:50 PM by Jodelle Gross, NP     She offers no complaints of chest pain that is out of the ordinary for her.  She denies dyspnea on exertion. She uses a walker for ambulation and states that she does her own cooking and cleans up her own area without chest discomfort. She is medically compliant. We will continue to manage her medically for now. She will followup with Dr. Dietrich Pates in 6 months unless she becomes symptomatic.    Aortic stenosis, mild   Last Assessment & Plan   11/18/2011 Office Visit Edited 11/20/2011 10:12 AM by Kathlen Brunswick, MD      Aortic stenosis was mild by echocardiography 18 months ago and by exam today.  It is unlikely that this will ever represent a significant problem for this elderly woman.    Cerebrovascular disease   Last Assessment & Plan   11/18/2011 Office Visit Written 11/18/2011  2:43 PM by Kathlen Brunswick, MD     Conservative care is also appropriate for cerebrovascular disease.  In the absence of significant neurologic findings, no further testing is anticipated.    Hyperlipidemia   Last Assessment & Plan   11/18/2011 Office Visit Written 11/18/2011  2:45 PM by Kathlen Brunswick, MD     No lipid profile available for review.  One will be obtained.      Other   Carcinoma of breast   Dementia   Last Assessment & Plan   06/21/2012 Office Visit Written 06/21/2012  1:51 PM by Jodelle Gross, NP     This does not appear to be very significant. She is very hard of hearing making it difficult with communication. However she appears lucid compliance and is able to understand her health status. She knows to call his she has any symptoms.    Gastroesophageal reflux disease   Dehydration   Gastroenteritis   Tachycardia       Imaging: No results found.

## 2013-06-10 ENCOUNTER — Other Ambulatory Visit (HOSPITAL_COMMUNITY): Payer: Self-pay | Admitting: Family Medicine

## 2013-06-10 DIAGNOSIS — N644 Mastodynia: Secondary | ICD-10-CM

## 2013-06-18 ENCOUNTER — Ambulatory Visit: Payer: Medicare Other | Admitting: Adult Health

## 2013-06-19 ENCOUNTER — Ambulatory Visit (HOSPITAL_COMMUNITY)
Admission: RE | Admit: 2013-06-19 | Discharge: 2013-06-19 | Disposition: A | Discharge status: Home / Self Care | Payer: Medicare Other | Source: Ambulatory Visit | Attending: Family Medicine | Admitting: Family Medicine

## 2013-06-19 ENCOUNTER — Other Ambulatory Visit (HOSPITAL_COMMUNITY): Payer: Self-pay | Admitting: Family Medicine

## 2013-06-19 DIAGNOSIS — N63 Unspecified lump in unspecified breast: Secondary | ICD-10-CM | POA: Insufficient documentation

## 2013-06-19 DIAGNOSIS — N644 Mastodynia: Secondary | ICD-10-CM

## 2013-06-19 DIAGNOSIS — Z853 Personal history of malignant neoplasm of breast: Secondary | ICD-10-CM | POA: Insufficient documentation

## 2013-06-20 ENCOUNTER — Ambulatory Visit: Payer: Medicare Other | Admitting: Adult Health

## 2013-06-26 ENCOUNTER — Other Ambulatory Visit (HOSPITAL_COMMUNITY): Payer: Self-pay | Admitting: Internal Medicine

## 2013-06-26 ENCOUNTER — Ambulatory Visit (HOSPITAL_COMMUNITY)
Admission: RE | Admit: 2013-06-26 | Discharge: 2013-06-26 | Disposition: A | Discharge status: Home / Self Care | Payer: Medicare Other | Source: Ambulatory Visit | Attending: Internal Medicine | Admitting: Internal Medicine

## 2013-06-26 ENCOUNTER — Ambulatory Visit (HOSPITAL_COMMUNITY)
Admission: RE | Admit: 2013-06-26 | Discharge: 2013-06-26 | Disposition: A | Discharge status: Home / Self Care | Payer: Medicare Other | Source: Ambulatory Visit | Attending: Family Medicine | Admitting: Family Medicine

## 2013-06-26 ENCOUNTER — Other Ambulatory Visit (HOSPITAL_COMMUNITY): Payer: Self-pay | Admitting: Family Medicine

## 2013-06-26 ENCOUNTER — Encounter (HOSPITAL_COMMUNITY): Payer: Self-pay

## 2013-06-26 DIAGNOSIS — N644 Mastodynia: Secondary | ICD-10-CM

## 2013-06-26 DIAGNOSIS — N63 Unspecified lump in unspecified breast: Secondary | ICD-10-CM | POA: Insufficient documentation

## 2013-06-26 MED ORDER — LIDOCAINE HCL (PF) 2 % IJ SOLN
INTRAMUSCULAR | Status: AC
  Filled 2013-06-26: qty 10

## 2013-06-26 MED ORDER — LIDOCAINE HCL (PF) 2 % IJ SOLN
10.0000 mL | Freq: Once | INTRAMUSCULAR | Status: AC
  Administered 2013-06-26: 11:00:00
  Filled 2013-06-26: qty 10

## 2013-06-26 NOTE — Progress Notes (Signed)
Pt for right breast biopsy with Dr. Konrad Saha. Lidocaine 2% 8ml given by MD. Pt tolerated procedure.

## 2013-07-11 ENCOUNTER — Ambulatory Visit: Payer: Medicare Other | Admitting: Adult Health

## 2014-01-10 ENCOUNTER — Emergency Department (HOSPITAL_COMMUNITY): Payer: Medicare Other

## 2014-01-10 ENCOUNTER — Encounter (HOSPITAL_COMMUNITY): Payer: Self-pay | Admitting: Emergency Medicine

## 2014-01-10 ENCOUNTER — Emergency Department (HOSPITAL_COMMUNITY)
Admission: EM | Admit: 2014-01-10 | Discharge: 2014-01-11 | Disposition: A | Discharge status: Home / Self Care | Payer: Medicare Other | Attending: Emergency Medicine | Admitting: Emergency Medicine

## 2014-01-10 DIAGNOSIS — I2510 Atherosclerotic heart disease of native coronary artery without angina pectoris: Secondary | ICD-10-CM | POA: Insufficient documentation

## 2014-01-10 DIAGNOSIS — Z880 Allergy status to penicillin: Secondary | ICD-10-CM | POA: Insufficient documentation

## 2014-01-10 DIAGNOSIS — F0390 Unspecified dementia without behavioral disturbance: Secondary | ICD-10-CM | POA: Insufficient documentation

## 2014-01-10 DIAGNOSIS — Z853 Personal history of malignant neoplasm of breast: Secondary | ICD-10-CM | POA: Insufficient documentation

## 2014-01-10 DIAGNOSIS — Z8639 Personal history of other endocrine, nutritional and metabolic disease: Secondary | ICD-10-CM | POA: Insufficient documentation

## 2014-01-10 DIAGNOSIS — W06XXXA Fall from bed, initial encounter: Secondary | ICD-10-CM | POA: Insufficient documentation

## 2014-01-10 DIAGNOSIS — Z8781 Personal history of (healed) traumatic fracture: Secondary | ICD-10-CM | POA: Insufficient documentation

## 2014-01-10 DIAGNOSIS — Z8659 Personal history of other mental and behavioral disorders: Secondary | ICD-10-CM | POA: Insufficient documentation

## 2014-01-10 DIAGNOSIS — S199XXA Unspecified injury of neck, initial encounter: Secondary | ICD-10-CM

## 2014-01-10 DIAGNOSIS — I10 Essential (primary) hypertension: Secondary | ICD-10-CM | POA: Insufficient documentation

## 2014-01-10 DIAGNOSIS — Z79899 Other long term (current) drug therapy: Secondary | ICD-10-CM | POA: Insufficient documentation

## 2014-01-10 DIAGNOSIS — K219 Gastro-esophageal reflux disease without esophagitis: Secondary | ICD-10-CM | POA: Insufficient documentation

## 2014-01-10 DIAGNOSIS — Z87440 Personal history of urinary (tract) infections: Secondary | ICD-10-CM | POA: Insufficient documentation

## 2014-01-10 DIAGNOSIS — Z8709 Personal history of other diseases of the respiratory system: Secondary | ICD-10-CM | POA: Insufficient documentation

## 2014-01-10 DIAGNOSIS — Y9210 Unspecified residential institution as the place of occurrence of the external cause: Secondary | ICD-10-CM | POA: Insufficient documentation

## 2014-01-10 DIAGNOSIS — Z7982 Long term (current) use of aspirin: Secondary | ICD-10-CM | POA: Insufficient documentation

## 2014-01-10 DIAGNOSIS — S46909A Unspecified injury of unspecified muscle, fascia and tendon at shoulder and upper arm level, unspecified arm, initial encounter: Secondary | ICD-10-CM | POA: Insufficient documentation

## 2014-01-10 DIAGNOSIS — I4891 Unspecified atrial fibrillation: Secondary | ICD-10-CM | POA: Insufficient documentation

## 2014-01-10 DIAGNOSIS — Z7902 Long term (current) use of antithrombotics/antiplatelets: Secondary | ICD-10-CM | POA: Insufficient documentation

## 2014-01-10 DIAGNOSIS — S0990XA Unspecified injury of head, initial encounter: Secondary | ICD-10-CM | POA: Insufficient documentation

## 2014-01-10 DIAGNOSIS — W19XXXA Unspecified fall, initial encounter: Secondary | ICD-10-CM

## 2014-01-10 DIAGNOSIS — S4980XA Other specified injuries of shoulder and upper arm, unspecified arm, initial encounter: Secondary | ICD-10-CM | POA: Insufficient documentation

## 2014-01-10 DIAGNOSIS — M25512 Pain in left shoulder: Secondary | ICD-10-CM

## 2014-01-10 DIAGNOSIS — M542 Cervicalgia: Secondary | ICD-10-CM

## 2014-01-10 DIAGNOSIS — Y9389 Activity, other specified: Secondary | ICD-10-CM | POA: Insufficient documentation

## 2014-01-10 DIAGNOSIS — S0993XA Unspecified injury of face, initial encounter: Secondary | ICD-10-CM | POA: Insufficient documentation

## 2014-01-10 DIAGNOSIS — Z862 Personal history of diseases of the blood and blood-forming organs and certain disorders involving the immune mechanism: Secondary | ICD-10-CM | POA: Insufficient documentation

## 2014-01-10 DIAGNOSIS — N760 Acute vaginitis: Secondary | ICD-10-CM | POA: Insufficient documentation

## 2014-01-10 MED ORDER — ACETAMINOPHEN 325 MG PO TABS
650.0000 mg | ORAL_TABLET | Freq: Once | ORAL | Status: AC
  Administered 2014-01-10: 650 mg via ORAL
  Filled 2014-01-10: qty 2

## 2014-01-10 NOTE — ED Notes (Signed)
Pt to department via EMS from [facility].  Per report, pt rolled out of bed and struck head on night stand before hitting floor.  Pt reporting pain on left side of head, as well as left shoulder.

## 2014-01-10 NOTE — ED Provider Notes (Signed)
CSN: [id number]     Arrival date & time 01/10/14  2105 History   First MD Initiated Contact with Patient 01/10/14 2122   This chart was scribed for Nat Christen, MD by Terressa Koyanagi, ED Scribe. This patient was seen in room APA18/APA18 and the patient's care was started at 9:39PM.  Chief Complaint  Patient presents with  . Fall   The history is provided by the nursing home and the EMS personnel. No language interpreter was used.   LEVEL 5 CEVEAT (Dementia)   HPI Comments: Maame Dack Yokley is a 80 y.o. female, with a history of dementia, brought in by ambulance from [facility], who presents to the Emergency Department complaining of a fall from earlier tonight. Per EMS pt rolled out of bed and struck her head on the night stand before hitting the floor, however, pt does not recall how she fell. Pt complains of associated severe bilateral forehead pain, neck pain and left shoulder pain onset after her fall. Pt denies lower leg pain and hip pain.   Past Medical History  Diagnosis Date  . Carcinoma of breast 2008    Right mastectomy  . Paroxysmal atrial fibrillation   . Hypertension   . Anxiety   . Arteriosclerotic cardiovascular disease (ASCVD)     Multiple prior PCI, most recently in 2007  . Dementia   . Gastroesophageal reflux disease   . Gout   . Hip fracture, left 2011  . Chest pain   . Acute gastroenteritis   . Interstitial pulmonary disease   . Bronchitis, mucopurulent recurrent   . Anxiety   . Depression   . CAD (coronary artery disease)   . UTI (lower urinary tract infection)    Past Surgical History  Procedure Laterality Date  . Orif hip fracture  2011  . Cataract extraction    . Mastectomy  2008    Right   History reviewed. No pertinent family history. History  Substance Use Topics  . Smoking status: Never Smoker   . Smokeless tobacco: Never Used  . Alcohol Use: No   OB History   Grav Para Term Preterm Abortions TAB SAB Ect Mult Living                  Review of Systems  Unable to perform ROS: Dementia   Allergies  Codeine; Penicillins; and Sulfa antibiotics  Home Medications   Current Outpatient Rx  Name  Route  Sig  Dispense  Refill  . acetaminophen (TYLENOL) 500 MG tablet   Oral   Take 500 mg by mouth every 6 (six) hours as needed. For pain          . amLODipine-benazepril (LOTREL) 5-20 MG per capsule   Oral   Take 1 capsule by mouth daily.           Marland Kitchen aspirin EC 81 MG tablet   Oral   Take 81 mg by mouth daily.           . Calcium Carb-Cholecalciferol (OYSTER SHELL CALCIUM + D) 500-400 MG-UNIT TABS   Oral   Take 1 tablet by mouth daily.           . clopidogrel (PLAVIX) 75 MG tablet   Oral   Take 75 mg by mouth daily.         . ferrous sulfate 325 (65 FE) MG tablet   Oral   Take 325 mg by mouth 2 (two) times daily.         Marland Kitchen  furosemide (LASIX) 20 MG tablet   Oral   Take 20 mg by mouth daily.         Marland Kitchen HYDROcodone-acetaminophen (NORCO/VICODIN) 5-325 MG per tablet   Oral   Take 1 tablet by mouth every 6 (six) hours as needed for pain.         Marland Kitchen loperamide (IMODIUM) 2 MG capsule   Oral   Take 2 mg by mouth daily as needed. For Diarrhea         . megestrol (MEGACE ES) 625 MG/5ML suspension   Oral   Take 375 mg by mouth daily.         . metoprolol succinate (TOPROL-XL) 50 MG 24 hr tablet   Oral   Take 50 mg by mouth every morning. Take with or immediately following a meal.         . Multiple Vitamin (MULTIVITAMIN WITH MINERALS) TABS   Oral   Take 1 tablet by mouth daily.         . nitroGLYCERIN (NITROSTAT) 0.4 MG SL tablet   Sublingual   Place 0.4 mg under the tongue every 5 (five) minutes x 3 doses as needed.         . ondansetron (ZOFRAN) 4 MG tablet   Oral   Take 4 mg by mouth every 6 (six) hours as needed. For nausea/vomiting         . pantoprazole (PROTONIX) 40 MG tablet   Oral   Take 40 mg by mouth daily.           . potassium chloride SA (K-DUR,KLOR-CON) 20  MEQ tablet   Oral   Take 20 mEq by mouth daily.           Marland Kitchen senna (SENOKOT) 8.6 MG TABS   Oral   Take 2 tablets by mouth daily. May take 2 extra tablets if needed.         . Vitamin D, Ergocalciferol, (DRISDOL) 50000 UNITS CAPS   Oral   Take 50,000 Units by mouth every 30 (thirty) days.         Marland Kitchen zinc sulfate (ZINC-220) 220 MG capsule   Oral   Take 220 mg by mouth daily.          Triage Vitals: BP 151/77  Pulse 89  Temp(Src) 98.7 F (37.1 C) (Oral)  Resp 18  SpO2 100%  Physical Exam  Nursing note and vitals reviewed. Constitutional: She is oriented to person, place, and time. She appears well-developed and well-nourished.  HENT:  Head: Normocephalic.  Eyes: Conjunctivae and EOM are normal. Pupils are equal, round, and reactive to light.  Neck: Normal range of motion. Neck supple.  Cardiovascular: Normal rate, regular rhythm and normal heart sounds.   Pulmonary/Chest: Effort normal and breath sounds normal.  Abdominal: Soft. Bowel sounds are normal.  Musculoskeletal: Normal range of motion. She exhibits tenderness.  Tender around peri-left shoulder  Neurological: She is alert and oriented to person, place, and time.  Skin: Skin is warm and dry.  Psychiatric: She has a normal mood and affect. Her behavior is normal.    ED Course  Procedures (including critical care time)  DIAGNOSTIC STUDIES:  Oxygen Saturation is 100% on RA, normal by my interpretation.    COORDINATION OF CARE:  9:44 PM-Discussed treatment plan which includes diagnostic radiology with pt at bedside and pt agreed to plan.   Results for orders placed during the hospital encounter of 06/08/12  URINE CULTURE      Result  Value Ref Range   Specimen Description URINE, CLEAN CATCH     Special Requests NONE     Culture  Setup Time 06/09/2012 01:26     Colony Count >=100,000 COLONIES/ML     Culture ESCHERICHIA COLI     Report Status 06/11/2012 FINAL     Organism ID, Bacteria ESCHERICHIA COLI     CBC WITH DIFFERENTIAL      Result Value Ref Range   WBC 7.1  4.0 - 10.5 K/uL   RBC 4.44  3.87 - 5.11 MIL/uL   Hemoglobin 14.1  12.0 - 15.0 g/dL   HCT 41.1  36.0 - 46.0 %   MCV 92.6  78.0 - 100.0 fL   MCH 31.8  26.0 - 34.0 pg   MCHC 34.3  30.0 - 36.0 g/dL   RDW 13.8  11.5 - 15.5 %   Platelets 182  150 - 400 K/uL   Neutrophils Relative % 57  43 - 77 %   Neutro Abs 4.0  1.7 - 7.7 K/uL   Lymphocytes Relative 32  12 - 46 %   Lymphs Abs 2.2  0.7 - 4.0 K/uL   Monocytes Relative 10  3 - 12 %   Monocytes Absolute 0.7  0.1 - 1.0 K/uL   Eosinophils Relative 2  0 - 5 %   Eosinophils Absolute 0.1  0.0 - 0.7 K/uL   Basophils Relative 0  0 - 1 %   Basophils Absolute 0.0  0.0 - 0.1 K/uL  COMPREHENSIVE METABOLIC PANEL      Result Value Ref Range   Sodium 137  135 - 145 mEq/L   Potassium 3.7  3.5 - 5.1 mEq/L   Chloride 102  96 - 112 mEq/L   CO2 23  19 - 32 mEq/L   Glucose, Bld 85  70 - 99 mg/dL   BUN 20  6 - 23 mg/dL   Creatinine, Ser 0.74  0.50 - 1.10 mg/dL   Calcium 9.3  8.4 - 10.5 mg/dL   Total Protein 6.5  6.0 - 8.3 g/dL   Albumin 3.3 (*) 3.5 - 5.2 g/dL   AST 20  0 - 37 U/L   ALT 11  0 - 35 U/L   Alkaline Phosphatase 71  39 - 117 U/L   Total Bilirubin 0.3  0.3 - 1.2 mg/dL   GFR calc non Af Amer 72 (*) >90 mL/min   GFR calc Af Amer 83 (*) >90 mL/min  LIPASE, BLOOD      Result Value Ref Range   Lipase 69 (*) 11 - 59 U/L  URINALYSIS, ROUTINE W REFLEX MICROSCOPIC      Result Value Ref Range   Color, Urine AMBER (*) YELLOW   APPearance CLOUDY (*) CLEAR   Specific Gravity, Urine 1.015  1.005 - 1.030   pH 5.5  5.0 - 8.0   Glucose, UA NEGATIVE  NEGATIVE mg/dL   Hgb urine dipstick TRACE (*) NEGATIVE   Bilirubin Urine NEGATIVE  NEGATIVE   Ketones, ur NEGATIVE  NEGATIVE mg/dL   Protein, ur NEGATIVE  NEGATIVE mg/dL   Urobilinogen, UA 0.2  0.0 - 1.0 mg/dL   Nitrite POSITIVE (*) NEGATIVE   Leukocytes, UA MODERATE (*) NEGATIVE  TROPONIN I      Result Value Ref Range   Troponin I <0.30   <0.30 ng/mL  LACTIC ACID, PLASMA      Result Value Ref Range   Lactic Acid, Venous 1.2  0.5 - 2.2 mmol/L  URINE MICROSCOPIC-ADD ON  Result Value Ref Range   Squamous Epithelial / LPF RARE  RARE   WBC, UA TOO NUMEROUS TO COUNT  <3 WBC/hpf   RBC / HPF 3-6  <3 RBC/hpf   Bacteria, UA MANY (*) RARE   Ct Head Wo Contrast  01/10/2014   CLINICAL DATA:  Fall, dementia  EXAM: CT HEAD WITHOUT CONTRAST  CT CERVICAL SPINE WITHOUT CONTRAST  TECHNIQUE: Multidetector CT imaging of the head and cervical spine was performed following the standard protocol without intravenous contrast. Multiplanar CT image reconstructions of the cervical spine were also generated.  COMPARISON:  Prior MRI and CT from 05/11/2012.  FINDINGS: CT HEAD FINDINGS  Diffuse prominence of the CSF containing spaces is compatible with generalized cerebral atrophy. Scattered and confluent hypodensity within the periventricular and deep white matter is most consistent with chronic small vessel ischemic changes. Small remote lacunar infarcts are seen within the basal ganglia bilaterally.  There is no acute intracranial hemorrhage or infarct. No mass lesion or midline shift. Gray-white matter differentiation is well maintained. Ventricles are normal in size without evidence of hydrocephalus. CSF containing spaces are within normal limits. No extra-axial fluid collection.  The calvarium is intact.  Orbital soft tissues are within normal limits.  The paranasal sinuses and mastoid air cells are well pneumatized and free of fluid.  Small left parietal scalp contusion is present.  CT CERVICAL SPINE FINDINGS  The vertebral bodies are normally aligned with preservation of the normal cervical lordosis. Trace anterolisthesis of C3 on C4 and C4 on C5 is present. Trace retrolisthesis of C5 on C6 also noted. Vertebral body heights are preserved. Normal C1-2 articulations are intact. No prevertebral soft tissue swelling. No acute fracture or listhesis.  Severe  multilevel degenerative disc disease as evidenced by intervertebral disc space narrowing, endplate osteophytosis and facet arthrosis is present, most severe at C5-6.  Heterogeneous patchy opacities are seen within the partially visualized right lung apex, incompletely evaluated.  IMPRESSION: CT BRAIN:  1. Left parietal scalp cephalhematoma. No acute intracranial process. 2. Advanced age-related atrophy with chronic microvascular ischemic disease and small bilateral remote lacunar infarcts.  CT CERVICAL SPINE:  1. No acute traumatic injury within the cervical spine. 2. Severe multilevel degenerative disc disease as above, most severe at C5-6. 3. Heterogeneous patchy opacity within the partially visualized right upper lobe, which may reflect possible infection/pneumonia. Correlation with chest x-ray could be performed as clinically indicated.   Electronically Signed   By: Jeannine Boga M.D.   On: 01/10/2014 22:45   Ct Cervical Spine Wo Contrast  01/10/2014   CLINICAL DATA:  Fall, dementia  EXAM: CT HEAD WITHOUT CONTRAST  CT CERVICAL SPINE WITHOUT CONTRAST  TECHNIQUE: Multidetector CT imaging of the head and cervical spine was performed following the standard protocol without intravenous contrast. Multiplanar CT image reconstructions of the cervical spine were also generated.  COMPARISON:  Prior MRI and CT from 05/11/2012.  FINDINGS: CT HEAD FINDINGS  Diffuse prominence of the CSF containing spaces is compatible with generalized cerebral atrophy. Scattered and confluent hypodensity within the periventricular and deep white matter is most consistent with chronic small vessel ischemic changes. Small remote lacunar infarcts are seen within the basal ganglia bilaterally.  There is no acute intracranial hemorrhage or infarct. No mass lesion or midline shift. Gray-white matter differentiation is well maintained. Ventricles are normal in size without evidence of hydrocephalus. CSF containing spaces are within normal  limits. No extra-axial fluid collection.  The calvarium is intact.  Orbital soft tissues are  within normal limits.  The paranasal sinuses and mastoid air cells are well pneumatized and free of fluid.  Small left parietal scalp contusion is present.  CT CERVICAL SPINE FINDINGS  The vertebral bodies are normally aligned with preservation of the normal cervical lordosis. Trace anterolisthesis of C3 on C4 and C4 on C5 is present. Trace retrolisthesis of C5 on C6 also noted. Vertebral body heights are preserved. Normal C1-2 articulations are intact. No prevertebral soft tissue swelling. No acute fracture or listhesis.  Severe multilevel degenerative disc disease as evidenced by intervertebral disc space narrowing, endplate osteophytosis and facet arthrosis is present, most severe at C5-6.  Heterogeneous patchy opacities are seen within the partially visualized right lung apex, incompletely evaluated.  IMPRESSION: CT BRAIN:  1. Left parietal scalp cephalhematoma. No acute intracranial process. 2. Advanced age-related atrophy with chronic microvascular ischemic disease and small bilateral remote lacunar infarcts.  CT CERVICAL SPINE:  1. No acute traumatic injury within the cervical spine. 2. Severe multilevel degenerative disc disease as above, most severe at C5-6. 3. Heterogeneous patchy opacity within the partially visualized right upper lobe, which may reflect possible infection/pneumonia. Correlation with chest x-ray could be performed as clinically indicated.   Electronically Signed   By: Jeannine Boga M.D.   On: 01/10/2014 22:45   Dg Shoulder Left  01/10/2014   CLINICAL DATA:  Fall, left shoulder pain  EXAM: LEFT SHOULDER - 2+ VIEW  COMPARISON:  02/12/2010  FINDINGS: No fracture or dislocation is seen.  Moderate degenerative changes involving the glenohumeral joint.  The visualized soft tissues are unremarkable.  Visualized left lung is clear.  IMPRESSION: No fracture or dislocation is seen.  Moderate  degenerative changes.   Electronically Signed   By: Julian Hy M.D.   On: 01/10/2014 22:18       EKG Interpretation None      MDM   Final diagnoses:  Fall  Head injury  Neck pain  Left shoulder pain    Patient appears to be at baseline. No gross neurological deficits. CT head, CT cervical spine, plain films of left shoulder all negative.  I personally performed the services described in this documentation, which was scribed in my presence. The recorded information has been reviewed and is accurate.    Nat Christen, MD 01/10/14 [id number]

## 2014-01-10 NOTE — Discharge Instructions (Signed)
X-rays of head, neck, left shoulder all negative for fracture.

## 2014-01-10 NOTE — ED Notes (Signed)
Report called to [facility]. Facility to come pick pt up.

## 2014-02-18 IMAGING — CR DG CERVICAL SPINE COMPLETE 4+V
6 series · 6 of 6 positions shown · non-contrast
Comparison: No priors.

CLINICAL DATA: Severe pain in the cervical spine.

CERVICAL SPINE - COMPLETE 4+ VIEW

[view not recorded (1 of 6)]
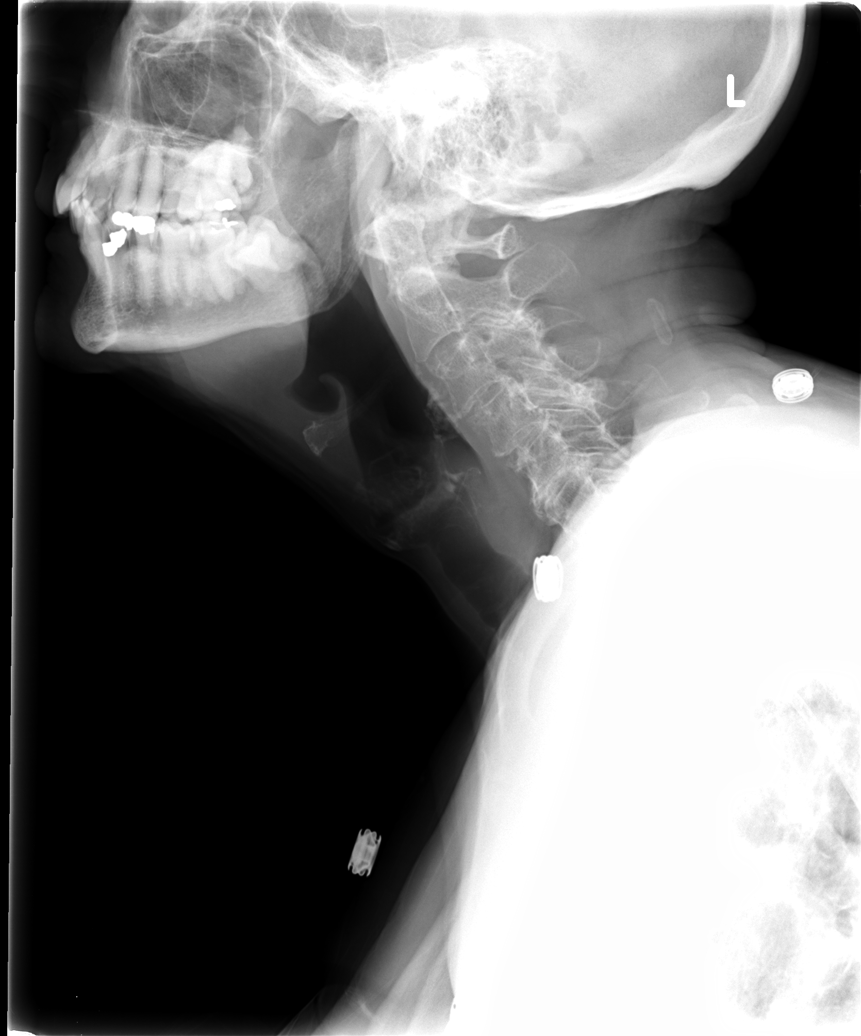

[view not recorded (2 of 6)]
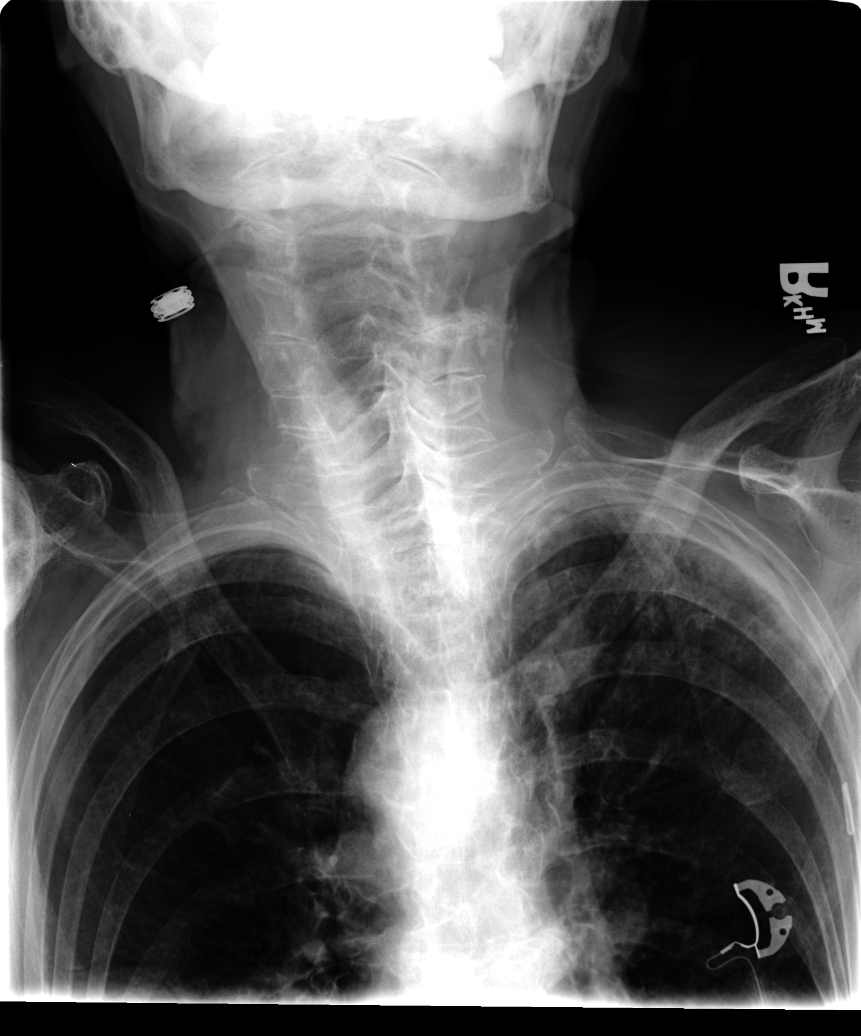

[view not recorded (3 of 6)]
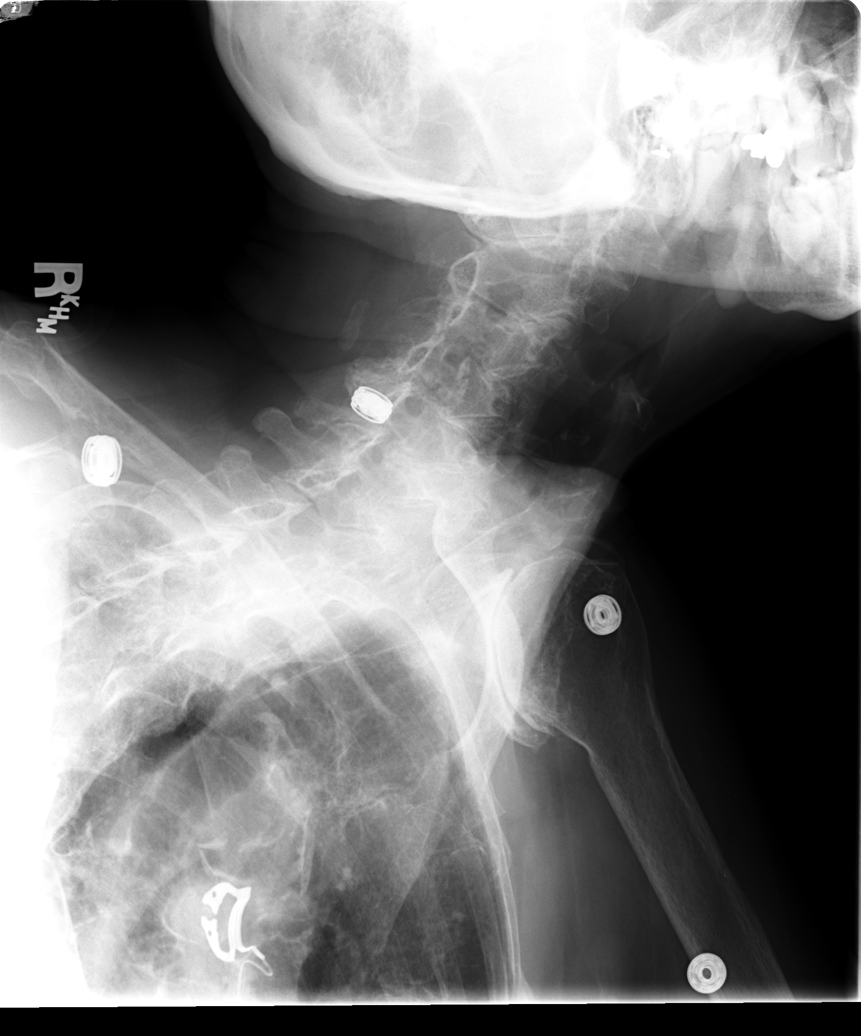

[view not recorded (4 of 6)]
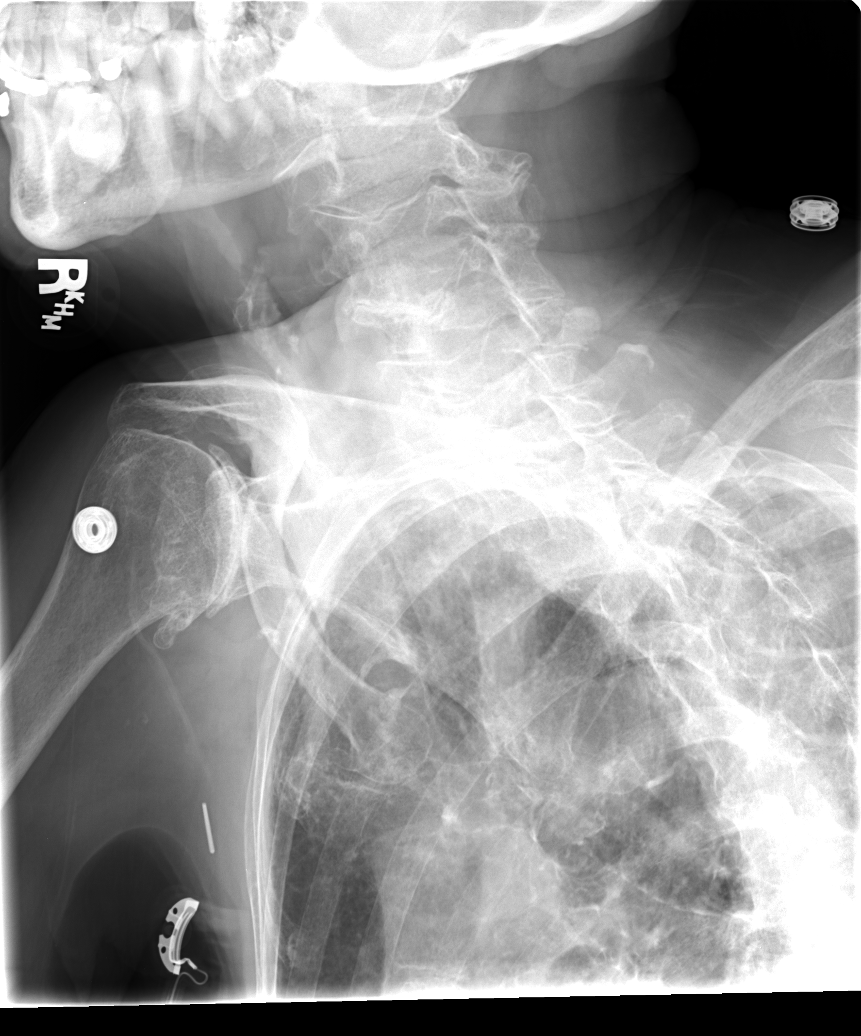

[view not recorded (5 of 6)]
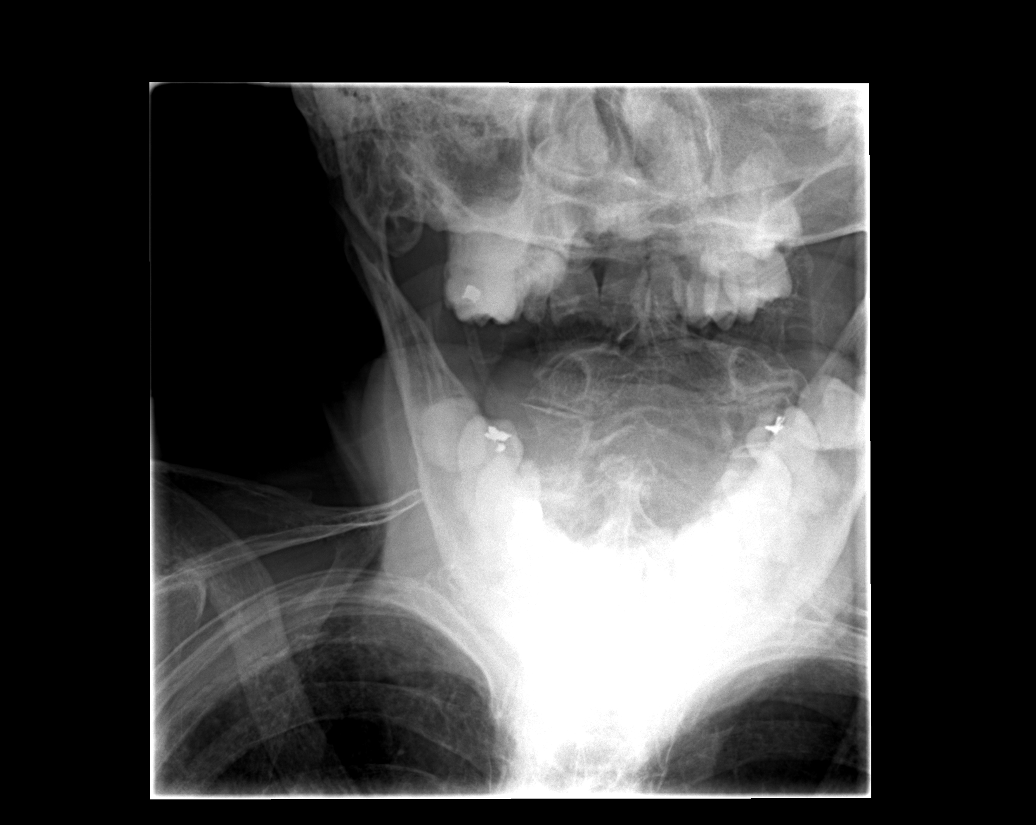

[view not recorded (6 of 6)]
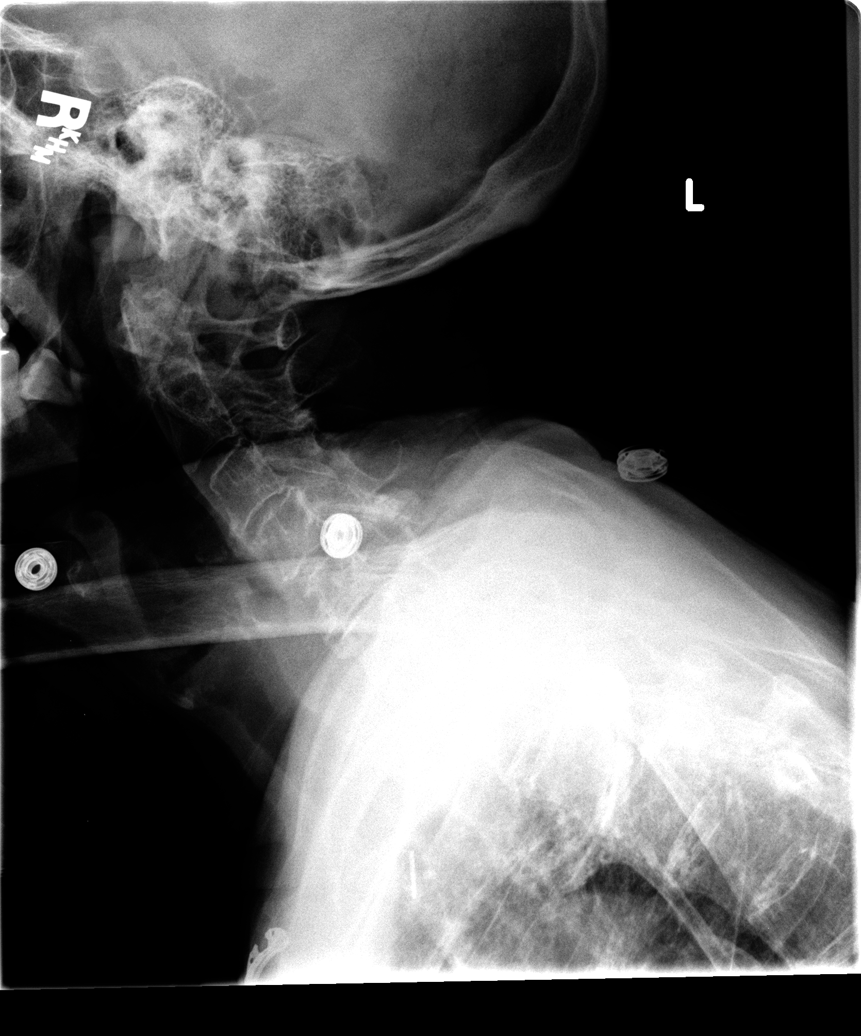

[6 of 6 positions shown; findings below may reference images not displayed]

FINDINGS: No dietary view is nondiagnostic.  The lateral view only
displays a cervical spine from the base of the skull to the
inferior endplate of C6 (C7 and the cervicothoracic junction are
incompletely visualized).  With these limitations in mind, there is
no definite acute displaced fracture or acute malalignment of the
cervical spine.  Prevertebral soft tissues are normal.  Bones are
osteopenic.  There is multilevel loss of intervertebral disc height
with associated facet heart arthropathy.  Heterotopic ossification
in the posterior paraspinal soft tissues as likely relates to
remote spinal trauma.
IMPRESSION: 1.  Very limited examination demonstrating no definite acute
radiographic abnormality of the cervical spine.  If there is
clinical concern for odontoid fracture or acute traumatic injury at
the level of C7 or C7-T1, further evaluation with CT of the
cervical spine may be warranted.
2.  Severe multilevel degenerative disc disease and cervical
spondylosis, as above.

## 2014-02-19 ENCOUNTER — Emergency Department (HOSPITAL_COMMUNITY): Payer: Medicare Other

## 2014-02-19 ENCOUNTER — Encounter (HOSPITAL_COMMUNITY): Payer: Self-pay | Admitting: Emergency Medicine

## 2014-02-19 ENCOUNTER — Inpatient Hospital Stay (HOSPITAL_COMMUNITY)
Admission: EM | Admit: 2014-02-19 | Discharge: 2014-02-27 | DRG: 470 | Disposition: A | Discharge status: Skilled Nursing Facility | Payer: Medicare Other | Attending: Internal Medicine | Admitting: Internal Medicine

## 2014-02-19 DIAGNOSIS — K219 Gastro-esophageal reflux disease without esophagitis: Secondary | ICD-10-CM | POA: Diagnosis present

## 2014-02-19 DIAGNOSIS — I10 Essential (primary) hypertension: Secondary | ICD-10-CM | POA: Diagnosis present

## 2014-02-19 DIAGNOSIS — M109 Gout, unspecified: Secondary | ICD-10-CM | POA: Diagnosis present

## 2014-02-19 DIAGNOSIS — W19XXXA Unspecified fall, initial encounter: Secondary | ICD-10-CM | POA: Diagnosis present

## 2014-02-19 DIAGNOSIS — E46 Unspecified protein-calorie malnutrition: Secondary | ICD-10-CM | POA: Diagnosis present

## 2014-02-19 DIAGNOSIS — I350 Nonrheumatic aortic (valve) stenosis: Secondary | ICD-10-CM

## 2014-02-19 DIAGNOSIS — Y9210 Unspecified residential institution as the place of occurrence of the external cause: Secondary | ICD-10-CM | POA: Diagnosis present

## 2014-02-19 DIAGNOSIS — Z880 Allergy status to penicillin: Secondary | ICD-10-CM

## 2014-02-19 DIAGNOSIS — N289 Disorder of kidney and ureter, unspecified: Secondary | ICD-10-CM

## 2014-02-19 DIAGNOSIS — Z7902 Long term (current) use of antithrombotics/antiplatelets: Secondary | ICD-10-CM

## 2014-02-19 DIAGNOSIS — Z79899 Other long term (current) drug therapy: Secondary | ICD-10-CM

## 2014-02-19 DIAGNOSIS — I4891 Unspecified atrial fibrillation: Secondary | ICD-10-CM | POA: Diagnosis present

## 2014-02-19 DIAGNOSIS — IMO0002 Reserved for concepts with insufficient information to code with codable children: Secondary | ICD-10-CM

## 2014-02-19 DIAGNOSIS — Z9010 Acquired absence of unspecified breast and nipple: Secondary | ICD-10-CM

## 2014-02-19 DIAGNOSIS — Z853 Personal history of malignant neoplasm of breast: Secondary | ICD-10-CM

## 2014-02-19 DIAGNOSIS — Z9861 Coronary angioplasty status: Secondary | ICD-10-CM

## 2014-02-19 DIAGNOSIS — Z7982 Long term (current) use of aspirin: Secondary | ICD-10-CM

## 2014-02-19 DIAGNOSIS — S72009A Fracture of unspecified part of neck of unspecified femur, initial encounter for closed fracture: Principal | ICD-10-CM | POA: Diagnosis present

## 2014-02-19 DIAGNOSIS — I2510 Atherosclerotic heart disease of native coronary artery without angina pectoris: Secondary | ICD-10-CM | POA: Diagnosis present

## 2014-02-19 DIAGNOSIS — I480 Paroxysmal atrial fibrillation: Secondary | ICD-10-CM | POA: Diagnosis present

## 2014-02-19 DIAGNOSIS — F0390 Unspecified dementia without behavioral disturbance: Secondary | ICD-10-CM | POA: Diagnosis present

## 2014-02-19 DIAGNOSIS — S72001A Fracture of unspecified part of neck of right femur, initial encounter for closed fracture: Secondary | ICD-10-CM | POA: Diagnosis present

## 2014-02-19 LAB — CBC WITH DIFFERENTIAL/PLATELET
Basophils Absolute: 0 K/uL (ref 0.0–0.1)
Basophils Relative: 0 % (ref 0–1)
Eosinophils Absolute: 0.1 K/uL (ref 0.0–0.7)
Eosinophils Relative: 1 % (ref 0–5)
HCT: 43.6 % (ref 36.0–46.0)
Hemoglobin: 14.1 g/dL (ref 12.0–15.0)
Lymphocytes Relative: 25 % (ref 12–46)
Lymphs Abs: 2.5 K/uL (ref 0.7–4.0)
MCH: 31.8 pg (ref 26.0–34.0)
MCHC: 32.3 g/dL (ref 30.0–36.0)
MCV: 98.2 fL (ref 78.0–100.0)
Monocytes Absolute: 0.8 K/uL (ref 0.1–1.0)
Monocytes Relative: 8 % (ref 3–12)
Neutro Abs: 6.7 K/uL (ref 1.7–7.7)
Neutrophils Relative %: 66 % (ref 43–77)
Platelets: 234 K/uL (ref 150–400)
RBC: 4.44 MIL/uL (ref 3.87–5.11)
RDW: 13.8 % (ref 11.5–15.5)
WBC: 9.9 K/uL (ref 4.0–10.5)

## 2014-02-19 MED ORDER — MORPHINE SULFATE 4 MG/ML IJ SOLN
4.0000 mg | Freq: Once | INTRAMUSCULAR | Status: AC
  Administered 2014-02-20: 4 mg via INTRAVENOUS
  Filled 2014-02-19: qty 1

## 2014-02-19 NOTE — ED Provider Notes (Signed)
CSN: [id number]     Arrival date & time 02/19/14  2219 History   First MD Initiated Contact with Patient 02/19/14 2330    This chart was scribed for Delora Fuel, MD by Terressa Koyanagi, ED Scribe. This patient was seen in room APA05/APA05 and the patient's care was started at 11:33 PM.   LEVEL 5 CAVEAT!!!! PCP: Rosita Fire, MD  Chief Complaint  Patient presents with  . Fall   The history is provided by the EMS personnel and the patient. No language interpreter was used.   HPI Comments: [name] is a 80 y.o. female, who is a resident of [place], with a history of dementia, HTN, CAD, hip fracture (left), and chest pain who presents to the Emergency Department complaining of a fall from earlier today. Per EMS, pt fell backwards in her bathroom today. Pt did not hit her head, however, pt now complains of  Pain in her right hip area. Earlier, she had complained to EMS of pain in her lower back and buttocks..    Past Medical History  Diagnosis Date  . Carcinoma of breast 2008    Right mastectomy  . Paroxysmal atrial fibrillation   . Hypertension   . Anxiety   . Arteriosclerotic cardiovascular disease (ASCVD)     Multiple prior PCI, most recently in 2007  . Dementia   . Gastroesophageal reflux disease   . Gout   . Hip fracture, left 2011  . Chest pain   . Acute gastroenteritis   . Interstitial pulmonary disease   . Bronchitis, mucopurulent recurrent   . Anxiety   . Depression   . CAD (coronary artery disease)   . UTI (lower urinary tract infection)    Past Surgical History  Procedure Laterality Date  . Orif hip fracture  2011  . Cataract extraction    . Mastectomy  2008    Right   No family history on file. History  Substance Use Topics  . Smoking status: Never Smoker   . Smokeless tobacco: Never Used  . Alcohol Use: No   OB History   Grav Para Term Preterm Abortions TAB SAB Ect Mult Living                 Review of Systems  Unable to perform ROS:  Dementia      Allergies  Codeine; Penicillins; and Sulfa antibiotics  Home Medications   Current Outpatient Rx  Name  Route  Sig  Dispense  Refill  . Amino Acids-Protein Hydrolys (FEEDING SUPPLEMENT, PRO-STAT SUGAR FREE 64,) LIQD   Oral   Take 30 mLs by mouth 2 (two) times daily.         Marland Kitchen amLODipine-benazepril (LOTREL) 5-20 MG per capsule   Oral   Take 1 capsule by mouth daily.           Marland Kitchen aspirin EC 81 MG tablet   Oral   Take 81 mg by mouth daily.           . Calcium Carb-Cholecalciferol (OYSTER SHELL CALCIUM + D) 500-400 MG-UNIT TABS   Oral   Take 1 tablet by mouth 2 (two) times daily.          . Cholecalciferol (VITAMIN D) 400 UNITS capsule   Oral   Take 400 Units by mouth daily.         . clopidogrel (PLAVIX) 75 MG tablet   Oral   Take 75 mg by mouth daily.         Marland Kitchen  divalproex (DEPAKOTE) 125 MG DR tablet   Oral   Take 125 mg by mouth at bedtime.         . furosemide (LASIX) 20 MG tablet   Oral   Take 20 mg by mouth daily.         Marland Kitchen HYDROcodone-acetaminophen (NORCO/VICODIN) 5-325 MG per tablet   Oral   Take 1 tablet by mouth 2 (two) times daily.          . megestrol (MEGACE ES) 625 MG/5ML suspension   Oral   Take 1,350 mg by mouth daily.          . metoprolol succinate (TOPROL-XL) 50 MG 24 hr tablet   Oral   Take 50 mg by mouth every morning. Take with or immediately following a meal.         . Multiple Vitamin (DAILY VITE) TABS   Oral   Take 1 tablet by mouth daily.         . pantoprazole (PROTONIX) 40 MG tablet   Oral   Take 40 mg by mouth daily.           . potassium chloride SA (K-DUR,KLOR-CON) 20 MEQ tablet   Oral   Take 20 mEq by mouth daily.           Marland Kitchen senna (SENOKOT) 8.6 MG TABS   Oral   Take 2 tablets by mouth daily. May take 2 extra tablets if needed.         . zinc sulfate (ZINC-220) 220 MG capsule   Oral   Take 220 mg by mouth daily.         Marland Kitchen acetaminophen (TYLENOL) 500 MG tablet   Oral    Take 500 mg by mouth every 6 (six) hours as needed. For pain          . loperamide (IMODIUM) 2 MG capsule   Oral   Take 2 mg by mouth daily as needed. For Diarrhea         . nitroGLYCERIN (NITROSTAT) 0.4 MG SL tablet   Sublingual   Place 0.4 mg under the tongue every 5 (five) minutes x 3 doses as needed.         . ondansetron (ZOFRAN) 4 MG tablet   Oral   Take 4 mg by mouth every 6 (six) hours as needed. For nausea/vomiting         Triage Vitals: BP 134/65  Pulse 86  Temp(Src) 99 F (37.2 C) (Oral)  Resp 18  Wt 110 lb (49.896 kg)  SpO2 98% Physical Exam  Nursing note and vitals reviewed. Constitutional: She appears well-developed and well-nourished. She appears distressed.  Long spineboard complaining of pain   HENT:  Head: Normocephalic and atraumatic.  Eyes: EOM are normal. Pupils are equal, round, and reactive to light.  Neck: No JVD present. No tracheal deviation present.  Neck nontender   Cardiovascular: Normal rate.  Exam reveals no gallop.   No murmur heard. Pulmonary/Chest: Effort normal. No respiratory distress. She has no wheezes. She has no rales.  Abdominal: Soft. She exhibits no mass. There is no tenderness.  Musculoskeletal: She exhibits no edema.  Tender marked pain with movement of right hip, right leg in shortened but not rotated  Lymphadenopathy:    She has no cervical adenopathy.  Neurological: She is alert. No cranial nerve deficit. Coordination normal.  Oriented to person and place but not time.  Skin: Skin is warm and dry. No rash noted.  ED Course  Procedures (including critical care time) DIAGNOSTIC STUDIES: Oxygen Saturation is 98% on RA, adequate by my interpretation.    Labs Review Results for orders placed during the hospital encounter of 02/19/14  CBC WITH DIFFERENTIAL      Result Value Ref Range   WBC 9.9  4.0 - 10.5 K/uL   RBC 4.44  3.87 - 5.11 MIL/uL   Hemoglobin 14.1  12.0 - 15.0 g/dL   HCT 43.6  36.0 - 46.0 %   MCV  98.2  78.0 - 100.0 fL   MCH 31.8  26.0 - 34.0 pg   MCHC 32.3  30.0 - 36.0 g/dL   RDW 13.8  11.5 - 15.5 %   Platelets 234  150 - 400 K/uL   Neutrophils Relative % 66  43 - 77 %   Neutro Abs 6.7  1.7 - 7.7 K/uL   Lymphocytes Relative 25  12 - 46 %   Lymphs Abs 2.5  0.7 - 4.0 K/uL   Monocytes Relative 8  3 - 12 %   Monocytes Absolute 0.8  0.1 - 1.0 K/uL   Eosinophils Relative 1  0 - 5 %   Eosinophils Absolute 0.1  0.0 - 0.7 K/uL   Basophils Relative 0  0 - 1 %   Basophils Absolute 0.0  0.0 - 0.1 K/uL  COMPREHENSIVE METABOLIC PANEL      Result Value Ref Range   Sodium 147  137 - 147 mEq/L   Potassium 4.4  3.7 - 5.3 mEq/L   Chloride 107  96 - 112 mEq/L   CO2 28  19 - 32 mEq/L   Glucose, Bld 129 (*) 70 - 99 mg/dL   BUN 42 (*) 6 - 23 mg/dL   Creatinine, Ser 1.44 (*) 0.50 - 1.10 mg/dL   Calcium 9.5  8.4 - 10.5 mg/dL   Total Protein 7.5  6.0 - 8.3 g/dL   Albumin 3.6  3.5 - 5.2 g/dL   AST 17  0 - 37 U/L   ALT 15  0 - 35 U/L   Alkaline Phosphatase 73  39 - 117 U/L   Total Bilirubin 0.2 (*) 0.3 - 1.2 mg/dL   GFR calc non Af Amer 30 (*) >90 mL/min   GFR calc Af Amer 35 (*) >90 mL/min  TYPE AND SCREEN      Result Value Ref Range   ABO/RH(D) O NEG     Antibody Screen NEG     Sample Expiration 02/22/2014     Imaging Review Dg Chest 1 View  02/20/2014   CLINICAL DATA:  Fall with right hip pain.  EXAM: CHEST - 1 VIEW  COMPARISON:  05/11/2012  FINDINGS: Chronic mild cardiomegaly. Coronary stenting. Vascular fullness of the hila is stable from priors. 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 No evidence of osseous erosion. No evidence of edema, consolidation, effusion, or pneumothorax. No acute osseous findings.  IMPRESSION: No active disease.   Electronically Signed   By: Jorje Guild M.D.   On: 02/20/2014 00:46   Dg Hip Complete Right  02/20/2014   CLINICAL  DATA:  Fall with hip pain  EXAM: RIGHT HIP - COMPLETE 2+ VIEW  COMPARISON:  None.  FINDINGS: Acute right femoral neck fracture, transcervical. There is displacement with varus angulation. The femoral head remains located.  Bipolar left hip hemiarthroplasty. No acute adverse feature of the imaged portions. Non bridging  heterotopic ossification noted lateral to the left hip. No evidence of pelvic ring fracture or diastasis.  IMPRESSION: Displaced right femoral neck fracture.   Electronically Signed   By: Jorje Guild M.D.   On: 02/20/2014 00:43   Images viewed by me.   Date: 02/20/2014  Rate: 101  Rhythm: sinus tachycardia  QRS Axis: left  Intervals: normal  ST/T Wave abnormalities: normal  Conduction Disutrbances:left anterior fascicular block  Narrative Interpretation:   Old EKG Reviewed: unchanged    MDM   Final diagnoses:  Fracture of femoral neck, right, closed  Renal insufficiency    Fall with right hip pain worrisome for hip fracture. Also, because of uncertainty of history, she will be sent for CT of head and cervical spine.  Hip x-ray confirms femoral neck fracture. Case is discussed with Dr. Darrick Meigs of [facility] who agrees to admit the patient and also discussed with Dr. Aline Brochure of orthopedic surgery who agrees to see the patient in consultation.  I personally performed the services described in this documentation, which was scribed in my presence. The recorded information has been reviewed and is accurate.     Delora Fuel, MD [id number]

## 2014-02-19 NOTE — ED Notes (Signed)
Per EMS, patient was being assisted to bathroom with walker and nurse aide at [facility].  Caregiver turned to turn on light and patient began falling backwards.  Caregiver grabbed patient's arms and assisted to floor; patient did not hit head, but c/o lower back pain where she landed on her buttocks.

## 2014-02-20 ENCOUNTER — Emergency Department (HOSPITAL_COMMUNITY): Payer: Medicare Other

## 2014-02-20 ENCOUNTER — Other Ambulatory Visit: Payer: Self-pay

## 2014-02-20 ENCOUNTER — Encounter (HOSPITAL_COMMUNITY): Payer: Self-pay | Admitting: General Practice

## 2014-02-20 DIAGNOSIS — F0390 Unspecified dementia without behavioral disturbance: Secondary | ICD-10-CM

## 2014-02-20 DIAGNOSIS — I359 Nonrheumatic aortic valve disorder, unspecified: Secondary | ICD-10-CM

## 2014-02-20 DIAGNOSIS — S72001A Fracture of unspecified part of neck of right femur, initial encounter for closed fracture: Secondary | ICD-10-CM | POA: Diagnosis present

## 2014-02-20 DIAGNOSIS — S7291XA Unspecified fracture of right femur, initial encounter for closed fracture: Secondary | ICD-10-CM | POA: Insufficient documentation

## 2014-02-20 DIAGNOSIS — S72009A Fracture of unspecified part of neck of unspecified femur, initial encounter for closed fracture: Principal | ICD-10-CM

## 2014-02-20 LAB — COMPREHENSIVE METABOLIC PANEL
ALBUMIN: 3.6 g/dL (ref 3.5–5.2)
ALT: 15 U/L (ref 0–35)
AST: 17 U/L (ref 0–37)
Alkaline Phosphatase: 73 U/L (ref 39–117)
BUN: 42 mg/dL — ABNORMAL HIGH (ref 6–23)
CHLORIDE: 107 meq/L (ref 96–112)
CO2: 28 meq/L (ref 19–32)
Calcium: 9.5 mg/dL (ref 8.4–10.5)
Creatinine, Ser: 1.44 mg/dL — ABNORMAL HIGH (ref 0.50–1.10)
GFR calc Af Amer: 35 mL/min — ABNORMAL LOW (ref >90)
GFR, EST NON AFRICAN AMERICAN: 30 mL/min — AB (ref >90)
Glucose, Bld: 129 mg/dL — ABNORMAL HIGH (ref 70–99)
Potassium: 4.4 mEq/L (ref 3.7–5.3)
Sodium: 147 mEq/L (ref 137–147)
Total Bilirubin: 0.2 mg/dL — ABNORMAL LOW (ref 0.3–1.2)
Total Protein: 7.5 g/dL (ref 6.0–8.3)

## 2014-02-20 LAB — TYPE AND SCREEN
ABO/RH(D): O NEG
Antibody Screen: NEGATIVE

## 2014-02-20 LAB — MRSA PCR SCREENING: MRSA by PCR: NEGATIVE

## 2014-02-20 MED ORDER — ONDANSETRON HCL 4 MG/2ML IJ SOLN: 4.0000 mg | Freq: Three times a day (TID) | INTRAMUSCULAR | Status: AC | PRN

## 2014-02-20 MED ORDER — AMLODIPINE BESYLATE 5 MG PO TABS
5.0000 mg | ORAL_TABLET | Freq: Every day | ORAL | Status: DC
  Administered 2014-02-20 – 2014-02-26 (×6): 5 mg via ORAL
  Filled 2014-02-20 (×6): qty 1

## 2014-02-20 MED ORDER — METOPROLOL SUCCINATE ER 50 MG PO TB24
50.0000 mg | ORAL_TABLET | Freq: Every day | ORAL | Status: DC
  Administered 2014-02-20 – 2014-02-26 (×6): 50 mg via ORAL
  Filled 2014-02-20 (×6): qty 1

## 2014-02-20 MED ORDER — MORPHINE SULFATE 2 MG/ML IJ SOLN
1.0000 mg | INTRAMUSCULAR | Status: DC | PRN
  Administered 2014-02-20 – 2014-02-26 (×23): 1 mg via INTRAVENOUS
  Filled 2014-02-20 (×24): qty 1

## 2014-02-20 MED ORDER — HYDROCODONE-ACETAMINOPHEN 5-325 MG PO TABS: 1.0000 | ORAL_TABLET | Freq: Four times a day (QID) | ORAL | Status: DC | PRN

## 2014-02-20 MED ORDER — MORPHINE SULFATE 2 MG/ML IJ SOLN
0.5000 mg | INTRAMUSCULAR | Status: DC | PRN
  Administered 2014-02-20: 0.5 mg via INTRAVENOUS
  Filled 2014-02-20: qty 1

## 2014-02-20 MED ORDER — HYDROCODONE-ACETAMINOPHEN 5-325 MG PO TABS
1.0000 | ORAL_TABLET | ORAL | Status: DC | PRN
  Administered 2014-02-20: 2 via ORAL
  Filled 2014-02-20: qty 2

## 2014-02-20 MED ORDER — ENOXAPARIN SODIUM 30 MG/0.3ML ~~LOC~~ SOLN
30.0000 mg | SUBCUTANEOUS | Status: DC
  Administered 2014-02-21 – 2014-02-23 (×3): 30 mg via SUBCUTANEOUS
  Filled 2014-02-20 (×3): qty 0.3

## 2014-02-20 MED ORDER — BENAZEPRIL HCL 10 MG PO TABS
20.0000 mg | ORAL_TABLET | Freq: Every day | ORAL | Status: DC
  Administered 2014-02-20 – 2014-02-26 (×6): 20 mg via ORAL
  Filled 2014-02-20 (×7): qty 2

## 2014-02-20 MED ORDER — MORPHINE SULFATE 2 MG/ML IJ SOLN
1.0000 mg | INTRAMUSCULAR | Status: DC | PRN
  Administered 2014-02-20: 1 mg via INTRAVENOUS
  Filled 2014-02-20: qty 1

## 2014-02-20 MED ORDER — ACETAMINOPHEN 10 MG/ML IV SOLN
500.0000 mg | Freq: Four times a day (QID) | INTRAVENOUS | Status: AC
  Administered 2014-02-20 – 2014-02-21 (×4): 500 mg via INTRAVENOUS
  Filled 2014-02-20 (×4): qty 50

## 2014-02-20 MED ORDER — AMLODIPINE BESY-BENAZEPRIL HCL 5-20 MG PO CAPS: 1.0000 | ORAL_CAPSULE | Freq: Every day | ORAL | Status: DC

## 2014-02-20 MED ORDER — DIVALPROEX SODIUM 125 MG PO DR TAB
125.0000 mg | DELAYED_RELEASE_TABLET | Freq: Every day | ORAL | Status: DC
  Administered 2014-02-20 – 2014-02-26 (×6): 125 mg via ORAL
  Filled 2014-02-20 (×8): qty 1

## 2014-02-20 NOTE — Care Management Note (Addendum)
    Page 1 of 1   02/25/2014     12:45:21 PM   CARE MANAGEMENT NOTE 02/25/2014  Patient:  [name], [name]   Account Number:  [id number]  Date Initiated:  02/20/2014  Documentation initiated by:  Claretha Cooper  Subjective/Objective Assessment:   Pt admitted from [facility] with a fractured hip. CSW assisting with possible placement after surgery. Pt on Plavix and will have to be off for a few days prior to surgery. Surgical plans for Monday.     Action/Plan:   Pain control, SNF after surgery   Anticipated DC Date:     Anticipated DC Plan:  SKILLED NURSING FACILITY  In-house referral  Clinical Social Worker      DC Planning Services  CM consult      Choice offered to / List presented to:             Status of service:  Completed, signed off Medicare Important Message given?   (If response is "NO", the following Medicare IM given date fields will be blank) Date Medicare IM given:   Date Additional Medicare IM given:    Discharge Disposition:    Per UR Regulation:    If discussed at Long Length of Stay Meetings, dates discussed:   02/25/2014    Comments:  02/25/14 Claretha Cooper RN BSN CM PO day 1, CSW assisting with placement at DC  02/20/14 [place]

## 2014-02-20 NOTE — Progress Notes (Signed)
Foley inserted by Tye Maryland, RN per MD order. Patient tolerated the insertion well. Bag is placed at the lowest level with good urine return. Will continue to monitor the patient.

## 2014-02-20 NOTE — Progress Notes (Signed)
Subjective: Patient was admitted after she fell in [place] and sustained rt hip fracture. She was seen by Dr. Aline Brochure and planned to do surgery  after stopping ASA and plavix for 5 days.   Objective: Vital signs in last 24 hours: Temp:  [97.7 F (36.5 C)-99 F (37.2 C)] 97.7 F (36.5 C) (04/09 0530) Pulse Rate:  [82-119] 100 (04/09 0611) Resp:  [18-20] 20 (04/09 1200) BP: (102-169)/(65-88) 169/83 mmHg (04/09 0530) SpO2:  [93 %-98 %] 94 % (04/09 1200) Weight:  [49.805 kg (109 lb 12.8 oz)-49.896 kg (110 lb)] 49.805 kg (109 lb 12.8 oz) (04/09 0320) Weight change:  Last BM Date:  (patient unable to tell)  Intake/Output from previous day:    PHYSICAL EXAM General appearance: slowed mentation Resp: clear to auscultation bilaterally Cardio: S1, S2 normal GI: soft, non-tender; bowel sounds normal; no masses,  no organomegaly Extremities: tenderness swelling of the rt hip  Lab Results:    @labtest @ [facility] No results found for this basename: PHART, PCO2, PO2ART, TCO2, HCO3,  in the last 72 hours CULTURES Recent Results (from the past 240 hour(s))  MRSA PCR SCREENING     Status: None   Collection Time    02/20/14  6:08 AM      Result Value Ref Range Status   MRSA by PCR NEGATIVE  NEGATIVE Final   Comment:            The GeneXpert MRSA Assay (FDA     approved for NASAL specimens     only), is one component of a     comprehensive MRSA colonization     surveillance program. It is not     intended to diagnose MRSA     infection nor to guide or     monitor treatment for     MRSA infections.   Studies/Results: Dg Chest 1 View  02/20/2014   CLINICAL DATA:  Fall with right hip pain.  EXAM: CHEST - 1 VIEW  COMPARISON:  05/11/2012  FINDINGS: Chronic mild cardiomegaly. Coronary stenting. Vascular fullness of the hila is stable from priors. There is chronic right apical extrapleural thickening and coarse interstitial opacity, consistent with radiation fibrosis given evidence of  previous right axillary dissection. No evidence of osseous erosion. No evidence of edema, consolidation, effusion, or pneumothorax. No acute osseous findings.  IMPRESSION: No active disease.   Electronically Signed   By: Jorje Guild M.D.   On: 02/20/2014 00:46   Dg Hip Complete Right  02/20/2014   CLINICAL DATA:  Fall with hip pain  EXAM: RIGHT HIP - COMPLETE 2+ VIEW  COMPARISON:  None.  FINDINGS: Acute right femoral neck fracture, transcervical. There is displacement with varus angulation. The femoral head remains located.  Bipolar left hip hemiarthroplasty. No acute adverse feature of the imaged portions. Non bridging heterotopic ossification noted lateral to the left hip. No evidence of pelvic ring fracture or diastasis.  IMPRESSION: Displaced right femoral neck fracture.   Electronically Signed   By: Jorje Guild M.D.   On: 02/20/2014 00:43   Ct Head Wo Contrast  02/20/2014   CLINICAL DATA:  Fall.  EXAM: CT HEAD WITHOUT CONTRAST  CT CERVICAL SPINE WITHOUT CONTRAST  TECHNIQUE: Multidetector CT imaging of the head and cervical spine was performed following the standard protocol without intravenous contrast. Multiplanar CT image reconstructions of the cervical spine were also generated.  COMPARISON:  CT of the head and cervical spine January 10, 2014.  FINDINGS: CT HEAD FINDINGS  The ventricles  and sulci are normal for age. No intraparenchymal hemorrhage, mass effect nor midline shift. Confluent supratentorial white matter hypodensities are similar. Remote left basal ganglia lacunar infarct. . No acute large vascular territory infarcts.  No abnormal extra-axial fluid collections. Basal cisterns are patent. Moderate calcific atherosclerosis of the carotid siphons.  No skull fracture. Resolution of left parietal scalp hematoma. Visualized paranasal sinuses and mastoid aircells are well-aerated. The included ocular globes and orbital contents are non-suspicious. Status post bilateral ocular lens implants.  Moderate left temporomandibular osteoarthrosis.  CT CERVICAL SPINE FINDINGS  No acute cervical vertebral body fracture. Minimal similar at C3-4 grade 1 anterolisthesis. Moderate to severe degenerative disc disease C3-4 thru C6-7. The right C3-4 facets are fused on degenerative basis. C1-2 articulation maintained with moderate to severe arthropathy. Bone mineral density is decreased without destructive bony lesions. Elongated right stylomastoid process. Severe calcific atherosclerosis of the carotid bifurcations, if clinical concern for hemodynamically significant stenosis carotid ultrasound or CT angiogram of the neck could be considered. 11 mm right thyroid nodule would be better characterized on thyroid sonogram clinically indicated. Patchy consolidation in right lung apex is slightly decreased.  Degenerative disc disease and facet arthropathy results in severe canal stenosis at C5-6. Severe right C4-5 and moderate to severe left C4-5 neural foraminal narrowing. Severe bilateral C5-6 and left C6-7 neural foraminal narrowing.  IMPRESSION: CT head:  No acute intracranial process.  Stable appearance of the head: Involutional changes. Severe white matter changes suggest chronic small vessel ischemic disease. Remote left basal ganglia lacunar infarct.  CT cervical spine: No acute fracture. Minimal grade 1 C3-4 anterolisthesis on degenerative basis.  Patchy consolidation right lung apex is slightly decreased.   Electronically Signed   By: Elon Alas   On: 02/20/2014 01:17   Ct Cervical Spine Wo Contrast  02/20/2014   CLINICAL DATA:  Fall.  EXAM: CT HEAD WITHOUT CONTRAST  CT CERVICAL SPINE WITHOUT CONTRAST  TECHNIQUE: Multidetector CT imaging of the head and cervical spine was performed following the standard protocol without intravenous contrast. Multiplanar CT image reconstructions of the cervical spine were also generated.  COMPARISON:  CT of the head and cervical spine January 10, 2014.  FINDINGS: CT HEAD  FINDINGS  The ventricles and sulci are normal for age. No intraparenchymal hemorrhage, mass effect nor midline shift. Confluent supratentorial white matter hypodensities are similar. Remote left basal ganglia lacunar infarct. . No acute large vascular territory infarcts.  No abnormal extra-axial fluid collections. Basal cisterns are patent. Moderate calcific atherosclerosis of the carotid siphons.  No skull fracture. Resolution of left parietal scalp hematoma. Visualized paranasal sinuses and mastoid aircells are well-aerated. The included ocular globes and orbital contents are non-suspicious. Status post bilateral ocular lens implants. Moderate left temporomandibular osteoarthrosis.  CT CERVICAL SPINE FINDINGS  No acute cervical vertebral body fracture. Minimal similar at C3-4 grade 1 anterolisthesis. Moderate to severe degenerative disc disease C3-4 thru C6-7. The right C3-4 facets are fused on degenerative basis. C1-2 articulation maintained with moderate to severe arthropathy. Bone mineral density is decreased without destructive bony lesions. Elongated right stylomastoid process. Severe calcific atherosclerosis of the carotid bifurcations, if clinical concern for hemodynamically significant stenosis carotid ultrasound or CT angiogram of the neck could be considered. 11 mm right thyroid nodule would be better characterized on thyroid sonogram clinically indicated. Patchy consolidation in right lung apex is slightly decreased.  Degenerative disc disease and facet arthropathy results in severe canal stenosis at C5-6. Severe right C4-5 and moderate to severe left C4-5 neural  foraminal narrowing. Severe bilateral C5-6 and left C6-7 neural foraminal narrowing.  IMPRESSION: CT head:  No acute intracranial process.  Stable appearance of the head: Involutional changes. Severe white matter changes suggest chronic small vessel ischemic disease. Remote left basal ganglia lacunar infarct.  CT cervical spine: No acute  fracture. Minimal grade 1 C3-4 anterolisthesis on degenerative basis.  Patchy consolidation right lung apex is slightly decreased.   Electronically Signed   By: Elon Alas   On: 02/20/2014 01:17    Medications: I have reviewed the patient's current medications.  Assesment: Principal Problem:   Fracture of femoral neck, right, closed Active Problems:   Paroxysmal atrial fibrillation   Hypertension   Arteriosclerotic cardiovascular disease (ASCVD)   Dementia    Plan: Medications reviewed As per orthopedics plan.    LOS: 1 day   Saksham Akkerman 02/20/2014, 1:40 PM

## 2014-02-20 NOTE — Clinical Social Work Placement (Signed)
     Clinical Social Work Department CLINICAL SOCIAL WORK PLACEMENT NOTE 02/27/2014  Patient:  [name], [name]  Account Number:  [id number] Admit date:  02/19/2014  Clinical Social Worker:  Edwyna Shell, CLINICAL SOCIAL WORKER  Date/time:  02/20/2014 12:00 N  Clinical Social Work is seeking post-discharge placement for this patient at the following level of care:   [place]   (*CSW will update this form in Epic as items are completed)   02/20/2014  Patient/family provided with [place] Department of Clinical Social Works list of facilities offering this level of care within the geographic area requested by the patient (or if unable, by the patients family).  02/20/2014  Patient/family informed of their freedom to choose among providers that offer the needed level of care, that participate in Medicare, Medicaid or managed care program needed by the patient, have an available bed and are willing to accept the patient.  02/20/2014  Patient/family informed of [facility] ownership interest in [facility], as well as of the fact that they are under no obligation to receive care at this facility.  PASARR submitted to EDS on 02/20/2014 PASARR number received from EDS on 02/20/2014  FL2 transmitted to all facilities in geographic area requested by pt/family on  02/20/2014 FL2 transmitted to all facilities within larger geographic area on   Patient informed that his/her managed care company has contracts with or will negotiate with  certain facilities, including the following:     Patient/family informed of bed offers received:  02/21/2014 Patient chooses bed at [facility] Physician recommends and patient chooses bed at    Patient to be transferred to [facility] on  02/27/2014 Patient to be transferred to facility by [facility] staff via tunnel  The following physician request were entered in Epic:   Additional Comments:

## 2014-02-20 NOTE — Progress Notes (Signed)
Per pharmacy pt will not receive scheduled dose of Lovenox @0330 . Pt will receive dose at 1000. Will continue to monitor the patient at this time.

## 2014-02-20 NOTE — Progress Notes (Signed)
NURSING PROGRESS NOTE  [name] [id number] Admitted to 323: 02/20/2014 Attending Provider: Rosita Fire, MD    [name] is a 80 y.o. female patient admitted from ED awake, alert  & orientated  X 1,  Full Code, VSS - Blood pressure 128/88, pulse 100, temperature 98.3 F (36.8 C), temperature source Oral, resp. rate 20, weight 49.805 kg (109 lb 12.8 oz), SpO2 96.00%. No c/o shortness of breath, no c/o chest pain, no distress noted.   IV site WDL:  Left hand with a transparent dsg that's clean dry and intact.  Allergies:   Allergies  Allergen Reactions  . Codeine Other (See Comments)    unknown  . Penicillins Other (See Comments)    unknown  . Sulfa Antibiotics Other (See Comments)    unknown     Past Medical History  Diagnosis Date  . Carcinoma of breast 2008    Right mastectomy  . Paroxysmal atrial fibrillation   . Hypertension   . Anxiety   . Arteriosclerotic cardiovascular disease (ASCVD)     Multiple prior PCI, most recently in 2007  . Dementia   . Gastroesophageal reflux disease   . Gout   . Hip fracture, left 2011  . Chest pain   . Acute gastroenteritis   . Interstitial pulmonary disease   . Bronchitis, mucopurulent recurrent   . Anxiety   . Depression   . CAD (coronary artery disease)   . UTI (lower urinary tract infection)     History:  obtained from Levada Dy, Therapist, sports at [facility].  Pt orientation to unit, room and routine.  Admission INP armband ID verified with patient, and in place. SR up x 2, fall risk assessment complete with bed alarm on at this time. Pt verbalizes an understanding of how to use the call bell and to call for help before getting out of bed.  Skin, clean-dry- intact with evidence of bruising, or skin tears.   No evidence of skin break down noted on exam with two RN's.    Will cont to monitor and assist as needed.  Regino Bellow, RN 02/20/2014

## 2014-02-20 NOTE — H&P (Signed)
PCP:   Rosita Fire, MD   Chief Complaint:  Fall  HPI: 80 year old female who   has a past medical history of Carcinoma of breast (2008); Paroxysmal atrial fibrillation; Hypertension; Anxiety; Arteriosclerotic cardiovascular disease (ASCVD); Dementia; Gastroesophageal reflux disease; Gout; Hip fracture, left (2011); Chest pain; Acute gastroenteritis; Interstitial pulmonary disease; Bronchitis, mucopurulent recurrent; Anxiety; Depression; CAD (coronary artery disease); and UTI (lower urinary tract infection). Was brought to the ED from [place] after patient fell in the bathroom. As per the ED notes patient was being assisted to bathroom with a walker and nurse aide,Caregiver turned to turn on light and patient began falling backwards. Caregiver grabbed patient's arms and assisted to floor; patient did not hit head, but c/o lower back pain where she landed on her buttocks. In the ED patient is found to have right femoral neck fracture on the x-ray. Patient has dementia and is unable to provide any significant history, as per records patient has history of CAD and stroke in the past and is currently on aspirin and Plavix. EKG shows normal sinus rhythm, echocardiogram done in 2011 showed no  wall motion abnormality.   Allergies:   Allergies  Allergen Reactions  . Codeine Other (See Comments)    unknown  . Penicillins Other (See Comments)    unknown  . Sulfa Antibiotics Other (See Comments)    unknown      Past Medical History  Diagnosis Date  . Carcinoma of breast 2008    Right mastectomy  . Paroxysmal atrial fibrillation   . Hypertension   . Anxiety   . Arteriosclerotic cardiovascular disease (ASCVD)     Multiple prior PCI, most recently in 2007  . Dementia   . Gastroesophageal reflux disease   . Gout   . Hip fracture, left 2011  . Chest pain   . Acute gastroenteritis   . Interstitial pulmonary disease   . Bronchitis, mucopurulent recurrent   . Anxiety   .  Depression   . CAD (coronary artery disease)   . UTI (lower urinary tract infection)     Past Surgical History  Procedure Laterality Date  . Orif hip fracture  2011  . Cataract extraction    . Mastectomy  2008    Right    Prior to Admission medications   Medication Sig Start Date End Date Taking? Authorizing Provider  Amino Acids-Protein Hydrolys (FEEDING SUPPLEMENT, PRO-STAT SUGAR FREE 64,) LIQD Take 30 mLs by mouth 2 (two) times daily.   Yes Historical Provider, MD  amLODipine-benazepril (LOTREL) 5-20 MG per capsule Take 1 capsule by mouth daily.     Yes Historical Provider, MD  aspirin EC 81 MG tablet Take 81 mg by mouth daily.     Yes Historical Provider, MD  Calcium Carb-Cholecalciferol (OYSTER SHELL CALCIUM + D) 500-400 MG-UNIT TABS Take 1 tablet by mouth 2 (two) times daily.    Yes Historical Provider, MD  Cholecalciferol (VITAMIN D) 400 UNITS capsule Take 400 Units by mouth daily.   Yes Historical Provider, MD  clopidogrel (PLAVIX) 75 MG tablet Take 75 mg by mouth daily.   Yes Historical Provider, MD  divalproex (DEPAKOTE) 125 MG DR tablet Take 125 mg by mouth at bedtime.   Yes Historical Provider, MD  furosemide (LASIX) 20 MG tablet Take 20 mg by mouth daily.   Yes Historical Provider, MD  HYDROcodone-acetaminophen (NORCO/VICODIN) 5-325 MG per tablet Take 1 tablet by mouth 2 (two) times daily.    Yes Historical Provider, MD  megestrol (MEGACE ES)  625 MG/5ML suspension Take 1,350 mg by mouth daily.    Yes Historical Provider, MD  metoprolol succinate (TOPROL-XL) 50 MG 24 hr tablet Take 50 mg by mouth every morning. Take with or immediately following a meal.   Yes Historical Provider, MD  Multiple Vitamin (DAILY VITE) TABS Take 1 tablet by mouth daily.   Yes Historical Provider, MD  pantoprazole (PROTONIX) 40 MG tablet Take 40 mg by mouth daily.     Yes Historical Provider, MD  potassium chloride SA (K-DUR,KLOR-CON) 20 MEQ tablet Take 20 mEq by mouth daily.     Yes Historical  Provider, MD  senna (SENOKOT) 8.6 MG TABS Take 2 tablets by mouth daily. May take 2 extra tablets if needed.   Yes Historical Provider, MD  zinc sulfate (ZINC-220) 220 MG capsule Take 220 mg by mouth daily.   Yes Historical Provider, MD  acetaminophen (TYLENOL) 500 MG tablet Take 500 mg by mouth every 6 (six) hours as needed. For pain     Historical Provider, MD  loperamide (IMODIUM) 2 MG capsule Take 2 mg by mouth daily as needed. For Diarrhea    Historical Provider, MD  nitroGLYCERIN (NITROSTAT) 0.4 MG SL tablet Place 0.4 mg under the tongue every 5 (five) minutes x 3 doses as needed. 09/12/11   Thayer Headings, MD  ondansetron (ZOFRAN) 4 MG tablet Take 4 mg by mouth every 6 (six) hours as needed. For nausea/vomiting    Historical Provider, MD    Social History:  reports that she has never smoked. She has never used smokeless tobacco. She reports that she does not drink alcohol or use illicit drugs.  No family history on file.   All the positives are listed in [facility]  Review of Systems:  Unable to obtain as patient is confused   Physical Exam: Blood pressure 134/65, pulse 86, temperature 99 F (37.2 C), temperature source Oral, resp. rate 18, weight 49.896 kg (110 lb), SpO2 98.00%. Constitutional:   Patient is malnourished female with dementia Head: Normocephalic and atraumatic Mouth: Mucus membranes moist Eyes: PERRL, EOMI, conjunctivae normal Neck: Supple, No Thyromegaly Cardiovascular: RRR, S1 normal, S2 normal Pulmonary/Chest: CTAB, no wheezes, rales, or rhonchi Abdominal: Soft. Non-tender, non-distended, bowel sounds are normal, no masses, organomegaly, or guarding present.  Neurological: Alert but not oriented Extremities : No Cyanosis, Clubbing or Edema   Labs on Admission:  Results for orders placed during the hospital encounter of 02/19/14 (from the past 48 hour(s))  TYPE AND SCREEN     Status: None   Collection Time    02/19/14 11:45 PM      Result Value Ref Range     ABO/RH(D) O NEG     Antibody Screen NEG     Sample Expiration 02/22/2014    CBC WITH DIFFERENTIAL     Status: None   Collection Time    02/19/14 11:50 PM      Result Value Ref Range   WBC 9.9  4.0 - 10.5 K/uL   RBC 4.44  3.87 - 5.11 MIL/uL   Hemoglobin 14.1  12.0 - 15.0 g/dL   HCT 43.6  36.0 - 46.0 %   MCV 98.2  78.0 - 100.0 fL   MCH 31.8  26.0 - 34.0 pg   MCHC 32.3  30.0 - 36.0 g/dL   RDW 13.8  11.5 - 15.5 %   Platelets 234  150 - 400 K/uL   Neutrophils Relative % 66  43 - 77 %   Neutro Abs  6.7  1.7 - 7.7 K/uL   Lymphocytes Relative 25  12 - 46 %   Lymphs Abs 2.5  0.7 - 4.0 K/uL   Monocytes Relative 8  3 - 12 %   Monocytes Absolute 0.8  0.1 - 1.0 K/uL   Eosinophils Relative 1  0 - 5 %   Eosinophils Absolute 0.1  0.0 - 0.7 K/uL   Basophils Relative 0  0 - 1 %   Basophils Absolute 0.0  0.0 - 0.1 K/uL  COMPREHENSIVE METABOLIC PANEL     Status: Abnormal   Collection Time    02/19/14 11:50 PM      Result Value Ref Range   Sodium 147  137 - 147 mEq/L   Potassium 4.4  3.7 - 5.3 mEq/L   Chloride 107  96 - 112 mEq/L   CO2 28  19 - 32 mEq/L   Glucose, Bld 129 (*) 70 - 99 mg/dL   BUN 42 (*) 6 - 23 mg/dL   Creatinine, Ser 1.44 (*) 0.50 - 1.10 mg/dL   Calcium 9.5  8.4 - 10.5 mg/dL   Total Protein 7.5  6.0 - 8.3 g/dL   Albumin 3.6  3.5 - 5.2 g/dL   AST 17  0 - 37 U/L   ALT 15  0 - 35 U/L   Alkaline Phosphatase 73  39 - 117 U/L   Total Bilirubin 0.2 (*) 0.3 - 1.2 mg/dL   GFR calc non Af Amer 30 (*) >90 mL/min   GFR calc Af Amer 35 (*) >90 mL/min   Comment: (NOTE)     The eGFR has been calculated using the CKD EPI equation.     This calculation has not been validated in all clinical situations.     eGFR's persistently <90 mL/min signify possible Chronic Kidney     Disease.    Radiological Exams on Admission: Dg Chest 1 View  02/20/2014   CLINICAL DATA:  Fall with right hip pain.  EXAM: CHEST - 1 VIEW  COMPARISON:  05/11/2012  FINDINGS: Chronic mild cardiomegaly. Coronary  stenting. Vascular fullness of the hila is stable from priors. 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 No evidence of osseous erosion. No evidence of edema, consolidation, effusion, or pneumothorax. No acute osseous findings.  IMPRESSION: No active disease.   Electronically Signed   By: Jorje Guild M.D.   On: 02/20/2014 00:46   Dg Hip Complete Right  02/20/2014   CLINICAL DATA:  Fall with hip pain  EXAM: RIGHT HIP - COMPLETE 2+ VIEW  COMPARISON:  None.  FINDINGS: Acute right femoral neck fracture, transcervical. There is displacement with varus angulation. The femoral head remains located.  Bipolar left hip hemiarthroplasty. No acute adverse feature of the imaged portions. Non bridging heterotopic ossification noted lateral to the left hip. No evidence of pelvic ring fracture or diastasis.  IMPRESSION: Displaced right femoral neck fracture.   Electronically Signed   By: Jorje Guild M.D.   On: 02/20/2014 00:43   Ct Head Wo Contrast  02/20/2014   CLINICAL DATA:  Fall.  EXAM: CT HEAD WITHOUT CONTRAST  CT CERVICAL SPINE WITHOUT CONTRAST  TECHNIQUE: Multidetector CT imaging of the head and cervical spine was performed following the standard protocol without intravenous contrast. Multiplanar CT image reconstructions of the cervical spine were also generated.  COMPARISON:  CT of the head and cervical spine January 10, 2014.  FINDINGS:  CT HEAD FINDINGS  The ventricles and sulci are normal for age. No intraparenchymal hemorrhage, mass effect nor midline shift. Confluent supratentorial white matter hypodensities are similar. Remote left basal ganglia lacunar infarct. . No acute large vascular territory infarcts.  No abnormal extra-axial fluid collections. Basal cisterns are patent. Moderate calcific atherosclerosis of the carotid siphons.  No skull fracture. Resolution of left  parietal scalp hematoma. Visualized paranasal sinuses and mastoid aircells are well-aerated. The included ocular globes and orbital contents are non-suspicious. Status post bilateral ocular lens implants. Moderate left temporomandibular osteoarthrosis.  CT CERVICAL SPINE FINDINGS  No acute cervical vertebral body fracture. Minimal similar at C3-4 grade 1 anterolisthesis. Moderate to severe degenerative disc disease C3-4 thru C6-7. The right C3-4 facets are fused on degenerative basis. C1-2 articulation maintained with moderate to severe arthropathy. Bone mineral density is decreased without destructive bony lesions. Elongated right stylomastoid process. Severe calcific atherosclerosis of the carotid bifurcations, if clinical concern for hemodynamically significant stenosis carotid ultrasound or CT angiogram of the neck could be considered. 11 mm right thyroid nodule would be better characterized on thyroid sonogram clinically indicated. Patchy consolidation in right lung apex is slightly decreased.  Degenerative disc disease and facet arthropathy results in severe canal stenosis at C5-6. Severe right C4-5 and moderate to severe left C4-5 neural foraminal narrowing. Severe bilateral C5-6 and left C6-7 neural foraminal narrowing.  IMPRESSION: CT head:  No acute intracranial process.  Stable appearance of the head: Involutional changes. Severe white matter changes suggest chronic small vessel ischemic disease. Remote left basal ganglia lacunar infarct.  CT cervical spine: No acute fracture. Minimal grade 1 C3-4 anterolisthesis on degenerative basis.  Patchy consolidation right lung apex is slightly decreased.   Electronically Signed   By: Elon Alas   On: 02/20/2014 01:17   Ct Cervical Spine Wo Contrast  02/20/2014   CLINICAL DATA:  Fall.  EXAM: CT HEAD WITHOUT CONTRAST  CT CERVICAL SPINE WITHOUT CONTRAST  TECHNIQUE: Multidetector CT imaging of the head and cervical spine was performed following the standard  protocol without intravenous contrast. Multiplanar CT image reconstructions of the cervical spine were also generated.  COMPARISON:  CT of the head and cervical spine January 10, 2014.  FINDINGS: CT HEAD FINDINGS  The ventricles and sulci are normal for age. No intraparenchymal hemorrhage, mass effect nor midline shift. Confluent supratentorial white matter hypodensities are similar. Remote left basal ganglia lacunar infarct. . No acute large vascular territory infarcts.  No abnormal extra-axial fluid collections. Basal cisterns are patent. Moderate calcific atherosclerosis of the carotid siphons.  No skull fracture. Resolution of left parietal scalp hematoma. Visualized paranasal sinuses and mastoid aircells are well-aerated. The included ocular globes and orbital contents are non-suspicious. Status post bilateral ocular lens implants. Moderate left temporomandibular osteoarthrosis.  CT CERVICAL SPINE FINDINGS  No acute cervical vertebral body fracture. Minimal similar at C3-4 grade 1 anterolisthesis. Moderate to severe degenerative disc disease C3-4 thru C6-7. The right C3-4 facets are fused on degenerative basis. C1-2 articulation maintained with moderate to severe arthropathy. Bone mineral density is decreased without destructive bony lesions. Elongated right stylomastoid process. Severe calcific atherosclerosis of the carotid bifurcations, if clinical concern for hemodynamically significant stenosis carotid ultrasound or CT angiogram of the neck could be considered. 11 mm right thyroid nodule would be better characterized on thyroid sonogram clinically indicated. Patchy consolidation in right lung apex is slightly decreased.  Degenerative disc disease and facet arthropathy results in severe canal stenosis at C5-6. Severe right C4-5 and  moderate to severe left C4-5 neural foraminal narrowing. Severe bilateral C5-6 and left C6-7 neural foraminal narrowing.  IMPRESSION: CT head:  No acute intracranial process.   Stable appearance of the head: Involutional changes. Severe white matter changes suggest chronic small vessel ischemic disease. Remote left basal ganglia lacunar infarct.  CT cervical spine: No acute fracture. Minimal grade 1 C3-4 anterolisthesis on degenerative basis.  Patchy consolidation right lung apex is slightly decreased.   Electronically Signed   By: Elon Alas   On: 02/20/2014 01:17    Assessment/Plan Principal Problem:   Fracture of femoral neck, right, closed Active Problems:   Paroxysmal atrial fibrillation   Hypertension   Arteriosclerotic cardiovascular disease (ASCVD)   Dementia  Fracture of femoral neck right We'll admit the patient and initiate hip fracture protocol.  Orthopedics was called by the ED physician  CAD Called and discussed with patient's daughter-in-law on phone, patient is a history of coronary stents and is currently on aspirin and Plavix. Will hold aspirin and Plavix, obtain cardiology consultation in a.m.  Dementia Stable  Risk stratification Patient is high risk for surgery considering the age and underlying history of CAD with coronary stents, family to discuss with orthopedics regarding surgery.  Code status: Presumed full code  Family discussion: Called and discussed with daughter in law on phone   Time Spent on Admission: 60 minutes  [facility] Pager: [phone] 02/20/2014, 2:57 AM  If 7PM-7AM, please contact night-coverage  [email or web address]  Password [email or web address]

## 2014-02-20 NOTE — Consult Note (Signed)
Reason for Consult: RIGHT HIP FRACTURE  Referring Physician: EVY LUTTERMAN [name] is an 80 y.o. female.  HPI: FROM CHART DUE TO DEMENTIA   80 year old female who   has a past medical history of Carcinoma of breast (2008); Paroxysmal atrial fibrillation; Hypertension; Anxiety; Arteriosclerotic cardiovascular disease (ASCVD); Dementia; Gastroesophageal reflux disease; Gout; Hip fracture, left (2011); Chest pain; Acute gastroenteritis; Interstitial pulmonary disease; Bronchitis, mucopurulent recurrent; Anxiety; Depression; CAD (coronary artery disease); and UTI (lower urinary tract infection). Was brought to the ED from [facility] after patient fell in the bathroom. As per the ED notes patient was being assisted to bathroom with a walker and nurse aide,Caregiver turned to turn on light and patient began falling backwards. Caregiver grabbed patient's arms and assisted to floor; patient did not hit head, but c/o lower back pain where she landed on her buttocks. In the ED patient is found to have right femoral neck fracture on the x-ray. Patient has dementia and is unable to provide any significant history, as per records patient has history of CAD and stroke in the past and is currently on aspirin and Plavix. EKG shows normal sinus rhythm, echocardiogram done in 2011 showed no  wall motion abnormality.  Past Medical History  Diagnosis Date  . Carcinoma of breast 2008    Right mastectomy  . Paroxysmal atrial fibrillation   . Hypertension   . Anxiety   . Arteriosclerotic cardiovascular disease (ASCVD)     Multiple prior PCI, most recently in 2007  . Dementia   . Gastroesophageal reflux disease   . Gout   . Hip fracture, left 2011  . Chest pain   . Acute gastroenteritis   . Interstitial pulmonary disease   . Bronchitis, mucopurulent recurrent   . Anxiety   . Depression   . CAD (coronary artery disease)   . UTI (lower urinary tract infection)     Past Surgical History  Procedure  Laterality Date  . Orif hip fracture  2011  . Cataract extraction    . Mastectomy  2008    Right    History reviewed. No pertinent family history.  Social History:  reports that she has never smoked. She has never used smokeless tobacco. She reports that she does not drink alcohol or use illicit drugs.  Allergies:  Allergies  Allergen Reactions  . Codeine Other (See Comments)    unknown  . Penicillins Other (See Comments)    unknown  . Sulfa Antibiotics Other (See Comments)    unknown    Medications: I have reviewed the patient's current medications.  PLAVIX !  Results for orders placed during the hospital encounter of 02/19/14 (from the past 48 hour(s))  TYPE AND SCREEN     Status: None   Collection Time    02/19/14 11:45 PM      Result Value Ref Range   ABO/RH(D) O NEG     Antibody Screen NEG     Sample Expiration 02/22/2014    CBC WITH DIFFERENTIAL     Status: None   Collection Time    02/19/14 11:50 PM      Result Value Ref Range   WBC 9.9  4.0 - 10.5 K/uL   RBC 4.44  3.87 - 5.11 MIL/uL   Hemoglobin 14.1  12.0 - 15.0 g/dL   HCT 43.6  36.0 - 46.0 %   MCV 98.2  78.0 - 100.0 fL   MCH 31.8  26.0 - 34.0 pg   MCHC 32.3  30.0 -  36.0 g/dL   RDW 13.8  11.5 - 15.5 %   Platelets 234  150 - 400 K/uL   Neutrophils Relative % 66  43 - 77 %   Neutro Abs 6.7  1.7 - 7.7 K/uL   Lymphocytes Relative 25  12 - 46 %   Lymphs Abs 2.5  0.7 - 4.0 K/uL   Monocytes Relative 8  3 - 12 %   Monocytes Absolute 0.8  0.1 - 1.0 K/uL   Eosinophils Relative 1  0 - 5 %   Eosinophils Absolute 0.1  0.0 - 0.7 K/uL   Basophils Relative 0  0 - 1 %   Basophils Absolute 0.0  0.0 - 0.1 K/uL  COMPREHENSIVE METABOLIC PANEL     Status: Abnormal   Collection Time    02/19/14 11:50 PM      Result Value Ref Range   Sodium 147  137 - 147 mEq/L   Potassium 4.4  3.7 - 5.3 mEq/L   Chloride 107  96 - 112 mEq/L   CO2 28  19 - 32 mEq/L   Glucose, Bld 129 (*) 70 - 99 mg/dL   BUN 42 (*) 6 - 23 mg/dL    Creatinine, Ser 1.44 (*) 0.50 - 1.10 mg/dL   Calcium 9.5  8.4 - 10.5 mg/dL   Total Protein 7.5  6.0 - 8.3 g/dL   Albumin 3.6  3.5 - 5.2 g/dL   AST 17  0 - 37 U/L   ALT 15  0 - 35 U/L   Alkaline Phosphatase 73  39 - 117 U/L   Total Bilirubin 0.2 (*) 0.3 - 1.2 mg/dL   GFR calc non Af Amer 30 (*) >90 mL/min   GFR calc Af Amer 35 (*) >90 mL/min   Comment: (NOTE)     The eGFR has been calculated using the CKD EPI equation.     This calculation has not been validated in all clinical situations.     eGFR's persistently <90 mL/min signify possible Chronic Kidney     Disease.    Dg Chest 1 View  02/20/2014   CLINICAL DATA:  Fall with right hip pain.  EXAM: CHEST - 1 VIEW  COMPARISON:  05/11/2012  FINDINGS: Chronic mild cardiomegaly. Coronary stenting. Vascular fullness of the hila is stable from priors. 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 No evidence of osseous erosion. No evidence of edema, consolidation, effusion, or pneumothorax. No acute osseous findings.  IMPRESSION: No active disease.   Electronically Signed   By: Jorje Guild M.D.   On: 02/20/2014 00:46   Dg Hip Complete Right  02/20/2014   CLINICAL DATA:  Fall with hip pain  EXAM: RIGHT HIP - COMPLETE 2+ VIEW  COMPARISON:  None.  FINDINGS: Acute right femoral neck fracture, transcervical. There is displacement with varus angulation. The femoral head remains located.  Bipolar left hip hemiarthroplasty. No acute adverse feature of the imaged portions. Non bridging heterotopic ossification noted lateral to the left hip. No evidence of pelvic ring fracture or diastasis.  IMPRESSION: Displaced right femoral neck fracture.   Electronically Signed   By: Jorje Guild M.D.   On: 02/20/2014 00:43   Ct Head Wo Contrast  02/20/2014   CLINICAL DATA:  Fall.  EXAM: CT HEAD WITHOUT CONTRAST  CT CERVICAL SPINE WITHOUT  CONTRAST  TECHNIQUE: Multidetector CT imaging of the head and cervical spine was performed following  the standard protocol without intravenous contrast. Multiplanar CT image reconstructions of the cervical spine were also generated.  COMPARISON:  CT of the head and cervical spine January 10, 2014.  FINDINGS: CT HEAD FINDINGS  The ventricles and sulci are normal for age. No intraparenchymal hemorrhage, mass effect nor midline shift. Confluent supratentorial white matter hypodensities are similar. Remote left basal ganglia lacunar infarct. . No acute large vascular territory infarcts.  No abnormal extra-axial fluid collections. Basal cisterns are patent. Moderate calcific atherosclerosis of the carotid siphons.  No skull fracture. Resolution of left parietal scalp hematoma. Visualized paranasal sinuses and mastoid aircells are well-aerated. The included ocular globes and orbital contents are non-suspicious. Status post bilateral ocular lens implants. Moderate left temporomandibular osteoarthrosis.  CT CERVICAL SPINE FINDINGS  No acute cervical vertebral body fracture. Minimal similar at C3-4 grade 1 anterolisthesis. Moderate to severe degenerative disc disease C3-4 thru C6-7. The right C3-4 facets are fused on degenerative basis. C1-2 articulation maintained with moderate to severe arthropathy. Bone mineral density is decreased without destructive bony lesions. Elongated right stylomastoid process. Severe calcific atherosclerosis of the carotid bifurcations, if clinical concern for hemodynamically significant stenosis carotid ultrasound or CT angiogram of the neck could be considered. 11 mm right thyroid nodule would be better characterized on thyroid sonogram clinically indicated. Patchy consolidation in right lung apex is slightly decreased.  Degenerative disc disease and facet arthropathy results in severe canal stenosis at C5-6. Severe right C4-5 and moderate to severe left C4-5 neural foraminal narrowing. Severe  bilateral C5-6 and left C6-7 neural foraminal narrowing.  IMPRESSION: CT head:  No acute intracranial process.  Stable appearance of the head: Involutional changes. Severe white matter changes suggest chronic small vessel ischemic disease. Remote left basal ganglia lacunar infarct.  CT cervical spine: No acute fracture. Minimal grade 1 C3-4 anterolisthesis on degenerative basis.  Patchy consolidation right lung apex is slightly decreased.   Electronically Signed   By: Elon Alas   On: 02/20/2014 01:17   Ct Cervical Spine Wo Contrast  02/20/2014   CLINICAL DATA:  Fall.  EXAM: CT HEAD WITHOUT CONTRAST  CT CERVICAL SPINE WITHOUT CONTRAST  TECHNIQUE: Multidetector CT imaging of the head and cervical spine was performed following the standard protocol without intravenous contrast. Multiplanar CT image reconstructions of the cervical spine were also generated.  COMPARISON:  CT of the head and cervical spine January 10, 2014.  FINDINGS: CT HEAD FINDINGS  The ventricles and sulci are normal for age. No intraparenchymal hemorrhage, mass effect nor midline shift. Confluent supratentorial white matter hypodensities are similar. Remote left basal ganglia lacunar infarct. . No acute large vascular territory infarcts.  No abnormal extra-axial fluid collections. Basal cisterns are patent. Moderate calcific atherosclerosis of the carotid siphons.  No skull fracture. Resolution of left parietal scalp hematoma. Visualized paranasal sinuses and mastoid aircells are well-aerated. The included ocular globes and orbital contents are non-suspicious. Status post bilateral ocular lens implants. Moderate left temporomandibular osteoarthrosis.  CT CERVICAL SPINE FINDINGS  No acute cervical vertebral body fracture. Minimal similar at C3-4 grade 1 anterolisthesis. Moderate to severe degenerative disc disease C3-4 thru C6-7. The right C3-4 facets are fused on degenerative basis. C1-2 articulation maintained with moderate to severe  arthropathy. Bone mineral density is decreased without destructive bony lesions. Elongated right stylomastoid process. Severe calcific atherosclerosis of the carotid bifurcations, if clinical concern for hemodynamically significant stenosis carotid ultrasound or CT angiogram of the neck could be considered. 11 mm right thyroid nodule would be better characterized  on thyroid sonogram clinically indicated. Patchy consolidation in right lung apex is slightly decreased.  Degenerative disc disease and facet arthropathy results in severe canal stenosis at C5-6. Severe right C4-5 and moderate to severe left C4-5 neural foraminal narrowing. Severe bilateral C5-6 and left C6-7 neural foraminal narrowing.  IMPRESSION: CT head:  No acute intracranial process.  Stable appearance of the head: Involutional changes. Severe white matter changes suggest chronic small vessel ischemic disease. Remote left basal ganglia lacunar infarct.  CT cervical spine: No acute fracture. Minimal grade 1 C3-4 anterolisthesis on degenerative basis.  Patchy consolidation right lung apex is slightly decreased.   Electronically Signed   By: Elon Alas   On: 02/20/2014 01:17    ROS-CAN'T ASSESS Blood pressure 169/83, pulse 100, temperature 97.7 F (36.5 C), temperature source Oral, resp. rate 20, height 5' 2.99" (1.6 m), weight 49.805 kg (109 lb 12.8 oz), SpO2 94.00%. Physical Exam  Nursing note and vitals reviewed. Constitutional: She appears well-developed. No distress.  HENT:  Head: Normocephalic and atraumatic.  Eyes: Conjunctivae are normal. Right eye exhibits no discharge. Left eye exhibits no discharge. No scleral icterus.  Neck: Normal range of motion. No JVD present. No tracheal deviation present. No thyromegaly present.  Cardiovascular: Intact distal pulses.   Respiratory: No respiratory distress.  GI: She exhibits no distension.  Musculoskeletal:       Right shoulder: Normal.       Left shoulder: Normal.       Right  hip: She exhibits decreased range of motion, tenderness and bony tenderness. She exhibits no deformity.       Left hip: She exhibits normal range of motion, normal strength, no tenderness, no bony tenderness, no swelling, no crepitus, no deformity and no laceration.       Right knee: Normal.       Left knee: Normal.       Feet:  Lymphadenopathy:    She has no cervical adenopathy.  Neurological: She is disoriented. She displays no atrophy and no tremor. No cranial nerve deficit or sensory deficit. She exhibits normal muscle tone. She displays no seizure activity. Gait abnormal. She displays no Babinski's sign on the right side. She displays no Babinski's sign on the left side.  DEMENTIA  Skin: She is not diaphoretic.    Assessment/Plan: 80 YO FEMALE WITH DEMENTIA LIVES IN NURSING FACILITY, FELL GOING TO BATHROOM, RIGHT DISPLACED FEMORAL NECK FRACTURE  ON PLAVIX.  I AM RECOMMENDING STOPPING PLAVIX IN PREPARATION FOR SURGERY. 5 DAYS. SURGERY Monday. I DO THINK THE SURGERY COULD BE DONE UNDER GENERAL ANESTHESIA WITH APPROPRIATE BLOOD PRODUCTS AVAILABLE AT [place]. I WILL DISCUSS WITH DR Rickey Barbara 02/20/2014, 10:40 AM

## 2014-02-20 NOTE — Clinical Social Work Psychosocial (Signed)
Clinical Social Work Department BRIEF PSYCHOSOCIAL ASSESSMENT 02/20/2014  Patient:  [name], [name]     Account Number:  [id number]     Admit date:  02/19/2014  Clinical Social Worker:  Edwyna Shell, [place]  Date/Time:  02/20/2014 11:30 AM  Referred by:  CSW  Date Referred:  02/20/2014 Referred for  SNF Placement   Other Referral:   Interview type:  Family Other interview type:    PSYCHOSOCIAL DATA Living Status:  FACILITY Admitted from facility:  [facility] Level of care:  Assisted Living Primary support name:  Tim Goldwater Primary support relationship to patient:  CHILD, ADULT Degree of support available:   Supportive family, placed at [facility] for several years.  Son is POA.    CURRENT CONCERNS Current Concerns  Post-Acute Placement   Other Concerns:    SOCIAL WORK ASSESSMENT / PLAN CSW met w daughter in law at bedside, patient has dementia, is oriented to place and situation (knows she is in hospital).  Patient told CSW "I love you, I am so glad to see you."  CSW has never met patient before, per daughter in law, patient is very affectionate towards caregivers.    Patient has been resident of [facility] approx 3 years, is currently placed in [facility] memory care unit under [place] benefits.  Family is very pleased w care received at [facility], says staff turnover is low and patient is familiar w other patients and caregivers.  Want her to return because her dementia may make switching facilities difficult and result in some cognitive decline. 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  Originally from [place] ,lived independently until her mid-80s.  Son and daughter in law live in [place], have son employed at [employer] and visit regularly.  Would prefer patient be placed at SNF in [place] if need be because they are here frequently and can  remain involved w patient's care.  Both son and daughter are returning from out of town trips so they can be present for patient's upcoming surgery.    CSW spoke w [facility], Olivia Mackie confirmed that patient has been there "forever."  Cannot manage to have patient back at discharge because she presents a high fall risk and may not understand need to remain in bed while recovering from surgery.  Facility fears she may refracture her bone. This is patient's second hip fracture, first occurred while she was living on her own.  [facility] is willing to take patient back at discharge; however, as patient is a Medicaid placement, she would need to return before the end of April 2015 or she will lose her bed.  Family can pay bed hold, family advised to speak w [facility].    Explained process of SNF bed search w daughter in law, as it is unclear when surgery will occur, CSW will inform facilities of possible delay in surgery.  Daiughter in law asked that CSW contact [facility] re possibility of return to [facility],  [facility] did so and informed daughter in law of facility response.  CSW will continue to assist is discharge planning as discharge needs become more clear.   Assessment/plan status:  Psychosocial Support/Ongoing Assessment of Needs Other assessment/ plan:   Information/referral to community resources:   SNF list, [facility]    PATIENT'S/FAMILY'S RESPONSE TO PLAN OF CARE: Daughter in law worried that change of facility may set patient back  due to her dementia but understands that this may be necessary as [facility] may not be able to manage her needs at discharge.  Family would like [facility] if possible. CSW left VM for [facility] admissions w family request.         , LCSW Clinical Social Worker ([phone])  

## 2014-02-20 NOTE — Progress Notes (Signed)
Utilization Review Complete  

## 2014-02-21 NOTE — Clinical Social Work Note (Signed)
Patient has bed offers from [facility] and [facility] [place], [place] left for son at home and work.  Edwyna Shell, LCSW Clinical Social Worker [phone])

## 2014-02-21 NOTE — Progress Notes (Signed)
16 Patient's family on unit.  Asking about surgery plans.  Dr. Aline Brochure paged.

## 2014-02-21 NOTE — Progress Notes (Signed)
Subjective: Patient is resting. She complaining of pain in her rt hip where she sustained fracture. Objective: Vital signs in last 24 hours: Temp:  [97.8 F (36.6 C)-98.8 F (37.1 C)] 98.8 F (37.1 C) (04/10 0447) Pulse Rate:  [79-89] 89 (04/10 0447) Resp:  [18-20] 20 (04/10 0447) BP: (128-144)/(65-82) 144/82 mmHg (04/10 0447) SpO2:  [93 %-98 %] 93 % (04/10 0447) Weight change:  Last BM Date:  (Patient unable to tell)  Intake/Output from previous day: 04/09 0701 - 04/10 0700 In: 320 [P.O.:220; IV Piggyback:100] Out: 600 [Urine:600]  PHYSICAL EXAM General appearance: slowed mentation Resp: clear to auscultation bilaterally Cardio: S1, S2 normal GI: soft, non-tender; bowel sounds normal; no masses,  no organomegaly Extremities: tenderness swelling of the rt hip  Lab Results:    @labtest @ [facility] No results found for this basename: PHART, PCO2, PO2ART, TCO2, HCO3,  in the last 72 hours CULTURES Recent Results (from the past 240 hour(s))  MRSA PCR SCREENING     Status: None   Collection Time    02/20/14  6:08 AM      Result Value Ref Range Status   MRSA by PCR NEGATIVE  NEGATIVE Final   Comment:            The GeneXpert MRSA Assay (FDA     approved for NASAL specimens     only), is one component of a     comprehensive MRSA colonization     surveillance program. It is not     intended to diagnose MRSA     infection nor to guide or     monitor treatment for     MRSA infections.   Studies/Results: Dg Chest 1 View  02/20/2014   CLINICAL DATA:  Fall with right hip pain.  EXAM: CHEST - 1 VIEW  COMPARISON:  05/11/2012  FINDINGS: Chronic mild cardiomegaly. Coronary stenting. Vascular fullness of the hila is stable from priors. 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 No evidence of osseous erosion. No evidence of edema, consolidation, effusion,  or pneumothorax. No acute osseous findings.  IMPRESSION: No active disease.   Electronically Signed   By: Jorje Guild M.D.   On: 02/20/2014 00:46   Dg Hip Complete Right  02/20/2014   CLINICAL DATA:  Fall with hip pain  EXAM: RIGHT HIP - COMPLETE 2+ VIEW  COMPARISON:  None.  FINDINGS: Acute right femoral neck fracture, transcervical. There is displacement with varus angulation. The femoral head remains located.  Bipolar left hip hemiarthroplasty. No acute adverse feature of the imaged portions. Non bridging heterotopic ossification noted lateral to the left hip. No evidence of pelvic ring fracture or diastasis.  IMPRESSION: Displaced right femoral neck fracture.   Electronically Signed   By: Jorje Guild M.D.   On: 02/20/2014 00:43   Ct Head Wo Contrast  02/20/2014   CLINICAL DATA:  Fall.  EXAM: CT HEAD WITHOUT CONTRAST  CT CERVICAL SPINE WITHOUT CONTRAST  TECHNIQUE: Multidetector CT imaging of the head and cervical spine was performed following the standard protocol without intravenous contrast. Multiplanar CT image reconstructions of the cervical spine were also generated.  COMPARISON:  CT of the head and cervical spine January 10, 2014.  FINDINGS: CT HEAD FINDINGS  The ventricles and sulci are normal for age. No intraparenchymal hemorrhage, mass effect nor midline shift. Confluent supratentorial white matter hypodensities are similar. Remote left basal ganglia lacunar infarct. Marland Kitchen No  acute large vascular territory infarcts.  No abnormal extra-axial fluid collections. Basal cisterns are patent. Moderate calcific atherosclerosis of the carotid siphons.  No skull fracture. Resolution of left parietal scalp hematoma. Visualized paranasal sinuses and mastoid aircells are well-aerated. The included ocular globes and orbital contents are non-suspicious. Status post bilateral ocular lens implants. Moderate left temporomandibular osteoarthrosis.  CT CERVICAL SPINE FINDINGS  No acute cervical vertebral body  fracture. Minimal similar at C3-4 grade 1 anterolisthesis. Moderate to severe degenerative disc disease C3-4 thru C6-7. The right C3-4 facets are fused on degenerative basis. C1-2 articulation maintained with moderate to severe arthropathy. Bone mineral density is decreased without destructive bony lesions. Elongated right stylomastoid process. Severe calcific atherosclerosis of the carotid bifurcations, if clinical concern for hemodynamically significant stenosis carotid ultrasound or CT angiogram of the neck could be considered. 11 mm right thyroid nodule would be better characterized on thyroid sonogram clinically indicated. Patchy consolidation in right lung apex is slightly decreased.  Degenerative disc disease and facet arthropathy results in severe canal stenosis at C5-6. Severe right C4-5 and moderate to severe left C4-5 neural foraminal narrowing. Severe bilateral C5-6 and left C6-7 neural foraminal narrowing.  IMPRESSION: CT head:  No acute intracranial process.  Stable appearance of the head: Involutional changes. Severe white matter changes suggest chronic small vessel ischemic disease. Remote left basal ganglia lacunar infarct.  CT cervical spine: No acute fracture. Minimal grade 1 C3-4 anterolisthesis on degenerative basis.  Patchy consolidation right lung apex is slightly decreased.   Electronically Signed   By: Elon Alas   On: 02/20/2014 01:17   Ct Cervical Spine Wo Contrast  02/20/2014   CLINICAL DATA:  Fall.  EXAM: CT HEAD WITHOUT CONTRAST  CT CERVICAL SPINE WITHOUT CONTRAST  TECHNIQUE: Multidetector CT imaging of the head and cervical spine was performed following the standard protocol without intravenous contrast. Multiplanar CT image reconstructions of the cervical spine were also generated.  COMPARISON:  CT of the head and cervical spine January 10, 2014.  FINDINGS: CT HEAD FINDINGS  The ventricles and sulci are normal for age. No intraparenchymal hemorrhage, mass effect nor midline  shift. Confluent supratentorial white matter hypodensities are similar. Remote left basal ganglia lacunar infarct. . No acute large vascular territory infarcts.  No abnormal extra-axial fluid collections. Basal cisterns are patent. Moderate calcific atherosclerosis of the carotid siphons.  No skull fracture. Resolution of left parietal scalp hematoma. Visualized paranasal sinuses and mastoid aircells are well-aerated. The included ocular globes and orbital contents are non-suspicious. Status post bilateral ocular lens implants. Moderate left temporomandibular osteoarthrosis.  CT CERVICAL SPINE FINDINGS  No acute cervical vertebral body fracture. Minimal similar at C3-4 grade 1 anterolisthesis. Moderate to severe degenerative disc disease C3-4 thru C6-7. The right C3-4 facets are fused on degenerative basis. C1-2 articulation maintained with moderate to severe arthropathy. Bone mineral density is decreased without destructive bony lesions. Elongated right stylomastoid process. Severe calcific atherosclerosis of the carotid bifurcations, if clinical concern for hemodynamically significant stenosis carotid ultrasound or CT angiogram of the neck could be considered. 11 mm right thyroid nodule would be better characterized on thyroid sonogram clinically indicated. Patchy consolidation in right lung apex is slightly decreased.  Degenerative disc disease and facet arthropathy results in severe canal stenosis at C5-6. Severe right C4-5 and moderate to severe left C4-5 neural foraminal narrowing. Severe bilateral C5-6 and left C6-7 neural foraminal narrowing.  IMPRESSION: CT head:  No acute intracranial process.  Stable appearance of the head: Involutional changes. Severe  white matter changes suggest chronic small vessel ischemic disease. Remote left basal ganglia lacunar infarct.  CT cervical spine: No acute fracture. Minimal grade 1 C3-4 anterolisthesis on degenerative basis.  Patchy consolidation right lung apex is  slightly decreased.   Electronically Signed   By: Elon Alas   On: 02/20/2014 01:17    Medications: I have reviewed the patient's current medications.  Assesment: Principal Problem:   Fracture of femoral neck, right, closed Active Problems:   Paroxysmal atrial fibrillation   Hypertension   Arteriosclerotic cardiovascular disease (ASCVD)   Dementia    Plan: Medications reviewed Continue pain management Cbc/bmp As per orthopedics plan.    LOS: 2 days   Admire Bunnell 02/21/2014, 8:22 AM

## 2014-02-22 LAB — CBC
HCT: 41.8 % (ref 36.0–46.0)
HEMOGLOBIN: 13.8 g/dL (ref 12.0–15.0)
MCH: 31.9 pg (ref 26.0–34.0)
MCHC: 33 g/dL (ref 30.0–36.0)
MCV: 96.8 fL (ref 78.0–100.0)
Platelets: 185 10*3/uL (ref 150–400)
RBC: 4.32 MIL/uL (ref 3.87–5.11)
RDW: 13.5 % (ref 11.5–15.5)
WBC: 9.3 10*3/uL (ref 4.0–10.5)

## 2014-02-22 LAB — BASIC METABOLIC PANEL
BUN: 21 mg/dL (ref 6–23)
CALCIUM: 9 mg/dL (ref 8.4–10.5)
CO2: 26 meq/L (ref 19–32)
CREATININE: 0.81 mg/dL (ref 0.50–1.10)
Chloride: 105 mEq/L (ref 96–112)
GFR calc Af Amer: 70 mL/min — ABNORMAL LOW (ref >90)
GFR calc non Af Amer: 60 mL/min — ABNORMAL LOW (ref >90)
GLUCOSE: 118 mg/dL — AB (ref 70–99)
Potassium: 3.9 mEq/L (ref 3.7–5.3)
Sodium: 143 mEq/L (ref 137–147)

## 2014-02-22 NOTE — Progress Notes (Signed)
Update Medical service has decided to keep patient here so we will order platelets and blood for surgery Monday  Objective: Vital signs in last 24 hours: Temp:  [97.8 F (36.6 C)-99.1 F (37.3 C)] 97.8 F (36.6 C) (04/11 0709) Pulse Rate:  [86-98] 89 (04/11 0709) Resp:  [17-98] 18 (04/11 0800) BP: (118-179)/(65-94) 179/94 mmHg (04/11 0709) SpO2:  [94 %-98 %] 96 % (04/11 0800)  Intake/Output from previous day:   Intake/Output this shift: Total I/O In: -  Out: 750 [Urine:750]   Recent Labs  02/19/14 2350 02/22/14 0544  HGB 14.1 13.8    Recent Labs  02/19/14 2350 02/22/14 0544  WBC 9.9 9.3  RBC 4.44 4.32  HCT 43.6 41.8  PLT 234 185    Recent Labs  02/19/14 2350 02/22/14 0544  NA 147 143  K 4.4 3.9  CL 107 105  CO2 28 26  BUN 42* 21  CREATININE 1.44* 0.81  GLUCOSE 129* 118*  CALCIUM 9.5 9.0   No results found for this basename: LABPT, INR,  in the last 72 hours     Carole Civil 02/22/2014, 12:44 PM

## 2014-02-22 NOTE — Progress Notes (Signed)
Subjective: Patient is resting. Her pain is controlled. Objective: Vital signs in last 24 hours: Temp:  [97.8 F (36.6 C)-99.1 F (37.3 C)] 97.8 F (36.6 C) (04/11 0709) Pulse Rate:  [86-98] 89 (04/11 0709) Resp:  [17-98] 18 (04/11 0800) BP: (118-179)/(65-94) 179/94 mmHg (04/11 0709) SpO2:  [94 %-98 %] 96 % (04/11 0800) Weight change:  Last BM Date:  (unknown - pt unable to tell)  Intake/Output from previous day:    PHYSICAL EXAM General appearance: slowed mentation Resp: clear to auscultation bilaterally Cardio: S1, S2 normal GI: soft, non-tender; bowel sounds normal; no masses,  no organomegaly Extremities: tenderness swelling of the rt hip  Lab Results:    @labtest @ [facility] No results found for this basename: PHART, PCO2, PO2ART, TCO2, HCO3,  in the last 72 hours CULTURES Recent Results (from the past 240 hour(s))  MRSA PCR SCREENING     Status: None   Collection Time    02/20/14  6:08 AM      Result Value Ref Range Status   MRSA by PCR NEGATIVE  NEGATIVE Final   Comment:            The GeneXpert MRSA Assay (FDA     approved for NASAL specimens     only), is one component of a     comprehensive MRSA colonization     surveillance program. It is not     intended to diagnose MRSA     infection nor to guide or     monitor treatment for     MRSA infections.   Studies/Results: No results found.  Medications: I have reviewed the patient's current medications.  Assesment: Principal Problem:   Fracture of femoral neck, right, closed Active Problems:   Paroxysmal atrial fibrillation   Hypertension   Arteriosclerotic cardiovascular disease (ASCVD)   Dementia    Plan: Medications reviewed Continue pain management As per orthopedics plan.    LOS: 3 days   Lionardo Haze 02/22/2014, 9:07 AM

## 2014-02-23 LAB — PROTIME-INR
INR: 1.12 (ref 0.00–1.49)
Prothrombin Time: 14.2 seconds (ref 11.6–15.2)

## 2014-02-23 LAB — SURGICAL PCR SCREEN
MRSA, PCR: NEGATIVE
STAPHYLOCOCCUS AUREUS: POSITIVE — AB

## 2014-02-23 LAB — PREPARE RBC (CROSSMATCH)

## 2014-02-23 LAB — APTT: APTT: 31 s (ref 24–37)

## 2014-02-23 MED ORDER — CHLORHEXIDINE GLUCONATE CLOTH 2 % EX PADS
6.0000 | MEDICATED_PAD | Freq: Every day | CUTANEOUS | Status: DC
  Administered 2014-02-24: 6 via TOPICAL

## 2014-02-23 MED ORDER — CHLORHEXIDINE GLUCONATE 4 % EX LIQD
60.0000 mL | Freq: Once | CUTANEOUS | Status: AC
  Administered 2014-02-24: 4 via TOPICAL
  Filled 2014-02-23: qty 60

## 2014-02-23 MED ORDER — MUPIROCIN 2 % EX OINT
1.0000 "application " | TOPICAL_OINTMENT | Freq: Two times a day (BID) | CUTANEOUS | Status: DC
  Administered 2014-02-24 – 2014-02-27 (×7): 1 via NASAL
  Filled 2014-02-23 (×2): qty 22

## 2014-02-23 MED ORDER — VANCOMYCIN HCL IN DEXTROSE 1-5 GM/200ML-% IV SOLN
1000.0000 mg | INTRAVENOUS | Status: AC
  Administered 2014-02-24: 1000 mg via INTRAVENOUS
  Filled 2014-02-23 (×2): qty 200

## 2014-02-23 NOTE — Progress Notes (Signed)
Patient ID: [name], female   DOB: [birth year], 80 y.o.   MRN: [id number]  Chief Complaint  Patient presents with  . Fall   Pre-op note :   Right hip fracture the patient has been on hold for the last few days for reversal of Plavix. I've ordered platelets as a precaution along with blood products.  The surgery to be done tomorrow she is scheduled for right bipolar hip replacement  CBC    Component Value Date/Time   WBC 9.3 02/22/2014 0544   RBC 4.32 02/22/2014 0544   HGB 13.8 02/22/2014 0544   HCT 41.8 02/22/2014 0544   PLT 185 02/22/2014 0544   MCV 96.8 02/22/2014 0544   MCH 31.9 02/22/2014 0544   MCHC 33.0 02/22/2014 0544   RDW 13.5 02/22/2014 0544   LYMPHSABS 2.5 02/19/2014 2350   MONOABS 0.8 02/19/2014 2350   EOSABS 0.1 02/19/2014 2350   BASOSABS 0.0 02/19/2014 2350    BMET    Component Value Date/Time   NA 143 02/22/2014 0544   K 3.9 02/22/2014 0544   CL 105 02/22/2014 0544   CO2 26 02/22/2014 0544   GLUCOSE 118* 02/22/2014 0544   BUN 21 02/22/2014 0544   CREATININE 0.81 02/22/2014 0544   CALCIUM 9.0 02/22/2014 0544   GFRNONAA 60* 02/22/2014 0544   GFRAA 70* 02/22/2014 0544

## 2014-02-23 NOTE — Progress Notes (Signed)
Subjective: Patient is resting. She is planned for surgery tomorrow. No new complaint. Objective: Vital signs in last 24 hours: Temp:  [98.2 F (36.8 C)-98.3 F (36.8 C)] 98.3 F (36.8 C) (04/12 0557) Pulse Rate:  [90-100] 95 (04/12 0557) Resp:  [18-20] 20 (04/12 0557) BP: (119-142)/(66-85) 135/81 mmHg (04/12 0557) SpO2:  [95 %-98 %] 96 % (04/12 0557) Weight change:  Last BM Date:  (unknown)  Intake/Output from previous day: 04/11 0701 - 04/12 0700 In: 420 [P.O.:420] Out: 1450 [Urine:1450]  PHYSICAL EXAM General appearance: slowed mentation Resp: clear to auscultation bilaterally Cardio: S1, S2 normal GI: soft, non-tender; bowel sounds normal; no masses,  no organomegaly Extremities: tenderness swelling of the rt hip  Lab Results:    @labtest @ [facility] No results found for this basename: PHART, PCO2, PO2ART, TCO2, HCO3,  in the last 72 hours CULTURES Recent Results (from the past 240 hour(s))  MRSA PCR SCREENING     Status: None   Collection Time    02/20/14  6:08 AM      Result Value Ref Range Status   MRSA by PCR NEGATIVE  NEGATIVE Final   Comment:            The GeneXpert MRSA Assay (FDA     approved for NASAL specimens     only), is one component of a     comprehensive MRSA colonization     surveillance program. It is not     intended to diagnose MRSA     infection nor to guide or     monitor treatment for     MRSA infections.   Studies/Results: No results found.  Medications: I have reviewed the patient's current medications.  Assesment: Principal Problem:   Fracture of femoral neck, right, closed Active Problems:   Paroxysmal atrial fibrillation   Hypertension   Arteriosclerotic cardiovascular disease (ASCVD)   Dementia    Plan: Medications reviewed Continue pain management As per orthopedics plan.    LOS: 4 days   [name] 02/23/2014, 9:39 AM

## 2014-02-24 ENCOUNTER — Encounter (HOSPITAL_COMMUNITY): Payer: Self-pay | Admitting: *Deleted

## 2014-02-24 ENCOUNTER — Encounter (HOSPITAL_COMMUNITY)
Admission: EM | Disposition: A | Discharge status: Skilled Nursing Facility | Payer: Self-pay | Source: Home / Self Care | Attending: Internal Medicine

## 2014-02-24 ENCOUNTER — Inpatient Hospital Stay (HOSPITAL_COMMUNITY): Payer: Medicare Other

## 2014-02-24 ENCOUNTER — Inpatient Hospital Stay (HOSPITAL_COMMUNITY): Payer: Medicare Other | Admitting: Anesthesiology

## 2014-02-24 ENCOUNTER — Encounter (HOSPITAL_COMMUNITY): Payer: Medicare Other | Admitting: Anesthesiology

## 2014-02-24 DIAGNOSIS — S72009A Fracture of unspecified part of neck of unspecified femur, initial encounter for closed fracture: Secondary | ICD-10-CM | POA: Diagnosis not present

## 2014-02-24 DIAGNOSIS — S72001A Fracture of unspecified part of neck of right femur, initial encounter for closed fracture: Secondary | ICD-10-CM | POA: Diagnosis present

## 2014-02-24 HISTORY — PX: HIP ARTHROPLASTY: SHX981

## 2014-02-24 SURGERY — HEMIARTHROPLASTY, HIP, DIRECT ANTERIOR APPROACH, FOR FRACTURE
Anesthesia: Monitor Anesthesia Care | Site: Hip | Laterality: Right

## 2014-02-24 MED ORDER — BISACODYL 10 MG RE SUPP
10.0000 mg | Freq: Every day | RECTAL | Status: DC | PRN
  Administered 2014-02-25: 10 mg via RECTAL
  Filled 2014-02-24: qty 1

## 2014-02-24 MED ORDER — POLYETHYLENE GLYCOL 3350 17 G PO PACK
17.0000 g | PACK | Freq: Every day | ORAL | Status: DC
  Administered 2014-02-26: 17 g via ORAL
  Filled 2014-02-24 (×2): qty 1

## 2014-02-24 MED ORDER — BUPIVACAINE-EPINEPHRINE PF 0.5-1:200000 % IJ SOLN
INTRAMUSCULAR | Status: DC | PRN
  Administered 2014-02-24: 60 mL

## 2014-02-24 MED ORDER — ACETAMINOPHEN 325 MG PO TABS
650.0000 mg | ORAL_TABLET | Freq: Four times a day (QID) | ORAL | Status: DC | PRN
  Administered 2014-02-25: 650 mg via ORAL
  Filled 2014-02-24: qty 2

## 2014-02-24 MED ORDER — FENTANYL CITRATE 0.05 MG/ML IJ SOLN
INTRAMUSCULAR | Status: DC | PRN
  Administered 2014-02-24: 20 ug via INTRATHECAL
  Administered 2014-02-24: 25 ug via INTRAVENOUS

## 2014-02-24 MED ORDER — BIOTENE DRY MOUTH MT LIQD
15.0000 mL | Freq: Two times a day (BID) | OROMUCOSAL | Status: DC
  Administered 2014-02-24 – 2014-02-27 (×6): 15 mL via OROMUCOSAL

## 2014-02-24 MED ORDER — BUPIVACAINE IN DEXTROSE 0.75-8.25 % IT SOLN
INTRATHECAL | Status: AC
  Filled 2014-02-24: qty 2

## 2014-02-24 MED ORDER — ONDANSETRON HCL 4 MG PO TABS: 4.0000 mg | ORAL_TABLET | Freq: Four times a day (QID) | ORAL | Status: DC | PRN

## 2014-02-24 MED ORDER — SENNA 8.6 MG PO TABS
1.0000 | ORAL_TABLET | Freq: Two times a day (BID) | ORAL | Status: DC
  Administered 2014-02-24 – 2014-02-26 (×4): 8.6 mg via ORAL
  Filled 2014-02-24 (×4): qty 1

## 2014-02-24 MED ORDER — METOCLOPRAMIDE HCL 10 MG PO TABS: 5.0000 mg | ORAL_TABLET | Freq: Three times a day (TID) | ORAL | Status: DC | PRN

## 2014-02-24 MED ORDER — MIDAZOLAM HCL 2 MG/2ML IJ SOLN
1.0000 mg | INTRAMUSCULAR | Status: DC | PRN
  Administered 2014-02-24: 1 mg via INTRAVENOUS

## 2014-02-24 MED ORDER — METOCLOPRAMIDE HCL 5 MG/ML IJ SOLN: 5.0000 mg | Freq: Three times a day (TID) | INTRAMUSCULAR | Status: DC | PRN

## 2014-02-24 MED ORDER — ALUM & MAG HYDROXIDE-SIMETH 200-200-20 MG/5ML PO SUSP: 30.0000 mL | ORAL | Status: DC | PRN

## 2014-02-24 MED ORDER — DOCUSATE SODIUM 100 MG PO CAPS
100.0000 mg | ORAL_CAPSULE | Freq: Two times a day (BID) | ORAL | Status: DC
  Administered 2014-02-24 – 2014-02-26 (×4): 100 mg via ORAL
  Filled 2014-02-24 (×4): qty 1

## 2014-02-24 MED ORDER — ONDANSETRON HCL 4 MG/2ML IJ SOLN: 4.0000 mg | Freq: Four times a day (QID) | INTRAMUSCULAR | Status: DC | PRN

## 2014-02-24 MED ORDER — EPHEDRINE SULFATE 50 MG/ML IJ SOLN
INTRAMUSCULAR | Status: DC | PRN
  Administered 2014-02-24 (×3): 5 mg via INTRAVENOUS

## 2014-02-24 MED ORDER — LACTATED RINGERS IV SOLN
INTRAVENOUS | Status: DC
  Administered 2014-02-24: 09:00:00 via INTRAVENOUS
  Administered 2014-02-24: 1000 mL via INTRAVENOUS

## 2014-02-24 MED ORDER — MIDAZOLAM HCL 2 MG/2ML IJ SOLN
INTRAMUSCULAR | Status: AC
  Filled 2014-02-24: qty 2

## 2014-02-24 MED ORDER — PHENYLEPHRINE HCL 10 MG/ML IJ SOLN
INTRAMUSCULAR | Status: DC | PRN
  Administered 2014-02-24 (×6): 100 ug via INTRAVENOUS

## 2014-02-24 MED ORDER — ONDANSETRON HCL 4 MG/2ML IJ SOLN: 4.0000 mg | Freq: Once | INTRAMUSCULAR | Status: DC | PRN

## 2014-02-24 MED ORDER — PHENYLEPHRINE HCL 10 MG/ML IJ SOLN
INTRAMUSCULAR | Status: AC
  Filled 2014-02-24: qty 1

## 2014-02-24 MED ORDER — BUPIVACAINE IN DEXTROSE 0.75-8.25 % IT SOLN
INTRATHECAL | Status: DC | PRN
  Administered 2014-02-24: 13 mg via INTRATHECAL

## 2014-02-24 MED ORDER — PROPOFOL INFUSION 10 MG/ML OPTIME
INTRAVENOUS | Status: DC | PRN
  Administered 2014-02-24: 20 ug/kg/min via INTRAVENOUS

## 2014-02-24 MED ORDER — BUPIVACAINE-EPINEPHRINE PF 0.5-1:200000 % IJ SOLN
INTRAMUSCULAR | Status: AC
  Filled 2014-02-24: qty 20

## 2014-02-24 MED ORDER — VANCOMYCIN HCL IN DEXTROSE 1-5 GM/200ML-% IV SOLN
1000.0000 mg | Freq: Two times a day (BID) | INTRAVENOUS | Status: AC
  Filled 2014-02-24: qty 200

## 2014-02-24 MED ORDER — HYDROCODONE-ACETAMINOPHEN 5-325 MG PO TABS
1.0000 | ORAL_TABLET | Freq: Four times a day (QID) | ORAL | Status: DC | PRN
  Administered 2014-02-24: 1 via ORAL
  Administered 2014-02-25 – 2014-02-27 (×5): 2 via ORAL
  Filled 2014-02-24 (×4): qty 2
  Filled 2014-02-24: qty 1
  Filled 2014-02-24: qty 2

## 2014-02-24 MED ORDER — MENTHOL 3 MG MT LOZG: 1.0000 | LOZENGE | OROMUCOSAL | Status: DC | PRN

## 2014-02-24 MED ORDER — SODIUM CHLORIDE 0.9 % IR SOLN
Status: DC | PRN
  Administered 2014-02-24: 1000 mL

## 2014-02-24 MED ORDER — MIDAZOLAM HCL 2 MG/2ML IJ SOLN
1.0000 mg | Freq: Once | INTRAMUSCULAR | Status: AC
  Administered 2014-02-24: 1 mg via INTRAVENOUS

## 2014-02-24 MED ORDER — ENOXAPARIN SODIUM 40 MG/0.4ML ~~LOC~~ SOLN
40.0000 mg | SUBCUTANEOUS | Status: DC
  Administered 2014-02-25 – 2014-02-27 (×3): 40 mg via SUBCUTANEOUS
  Filled 2014-02-24 (×3): qty 0.4

## 2014-02-24 MED ORDER — ACETAMINOPHEN 10 MG/ML IV SOLN
500.0000 mg | Freq: Four times a day (QID) | INTRAVENOUS | Status: AC
  Administered 2014-02-24: 500 mg via INTRAVENOUS
  Filled 2014-02-24 (×5): qty 50

## 2014-02-24 MED ORDER — PROPOFOL 10 MG/ML IV BOLUS
INTRAVENOUS | Status: DC | PRN
  Administered 2014-02-24 (×2): 10 mg via INTRAVENOUS

## 2014-02-24 MED ORDER — SODIUM CHLORIDE BACTERIOSTATIC 0.9 % IJ SOLN
INTRAMUSCULAR | Status: AC
  Filled 2014-02-24: qty 20

## 2014-02-24 MED ORDER — MIDAZOLAM HCL 5 MG/ML IJ SOLN: 1.0000 mg | Freq: Once | INTRAMUSCULAR | Status: DC

## 2014-02-24 MED ORDER — PHENOL 1.4 % MT LIQD: 1.0000 | OROMUCOSAL | Status: DC | PRN

## 2014-02-24 MED ORDER — PROPOFOL 10 MG/ML IV BOLUS
INTRAVENOUS | Status: AC
  Filled 2014-02-24: qty 20

## 2014-02-24 MED ORDER — FENTANYL CITRATE 0.05 MG/ML IJ SOLN: 25.0000 ug | INTRAMUSCULAR | Status: DC | PRN

## 2014-02-24 MED ORDER — ACETAMINOPHEN 650 MG RE SUPP: 650.0000 mg | Freq: Four times a day (QID) | RECTAL | Status: DC | PRN

## 2014-02-24 MED ORDER — EPHEDRINE SULFATE 50 MG/ML IJ SOLN
INTRAMUSCULAR | Status: AC
  Filled 2014-02-24: qty 1

## 2014-02-24 MED ORDER — FENTANYL CITRATE 0.05 MG/ML IJ SOLN
INTRAMUSCULAR | Status: AC
  Filled 2014-02-24: qty 2

## 2014-02-24 MED ORDER — SODIUM CHLORIDE 0.9 % IV SOLN
INTRAVENOUS | Status: DC
  Administered 2014-02-24 – 2014-02-26 (×4): via INTRAVENOUS

## 2014-02-24 SURGICAL SUPPLY — 65 items
BAG HAMPER (MISCELLANEOUS) ×3 IMPLANT
BIT DRILL 2.8X128 (BIT) ×2 IMPLANT
BIT DRILL 2.8X128MM (BIT) ×1
BLADE HEX COATED 2.75 (ELECTRODE) ×6 IMPLANT
BLADE SAGITTAL 25.0X1.27X90 (BLADE) ×2 IMPLANT
BLADE SAGITTAL 25.0X1.27X90MM (BLADE) ×1
BLADE SURG SZ10 CARB STEEL (BLADE) ×3 IMPLANT
BRUSH FEMORAL CANAL (MISCELLANEOUS) IMPLANT
CAPT HIP FX BIPOLAR/UNIPOLAR ×3 IMPLANT
CHLORAPREP W/TINT 26ML (MISCELLANEOUS) ×3 IMPLANT
CLOTH BEACON ORANGE TIMEOUT ST (SAFETY) ×3 IMPLANT
COVER LIGHT HANDLE STERIS (MISCELLANEOUS) ×6 IMPLANT
COVER PROBE W GEL 5X96 (DRAPES) ×3 IMPLANT
DECANTER SPIKE VIAL GLASS SM (MISCELLANEOUS) ×6 IMPLANT
DRAPE HIP W/POCKET STRL (DRAPE) ×3 IMPLANT
DRESSING ALLEVYN BORDER HEEL (GAUZE/BANDAGES/DRESSINGS) ×3 IMPLANT
DRESSING ALLEVYN LIFE SACRUM (GAUZE/BANDAGES/DRESSINGS) ×3 IMPLANT
DRSG MEPILEX BORDER 4X12 (GAUZE/BANDAGES/DRESSINGS) ×3 IMPLANT
ELECT REM PT RETURN 9FT ADLT (ELECTROSURGICAL) ×3
ELECTRODE REM PT RTRN 9FT ADLT (ELECTROSURGICAL) ×1 IMPLANT
EVACUATOR 3/16  PVC DRAIN (DRAIN)
EVACUATOR 3/16 PVC DRAIN (DRAIN) IMPLANT
FACESHIELD LNG OPTICON STERILE (SAFETY) ×3 IMPLANT
GLOVE BIOGEL M 7.0 STRL (GLOVE) ×3 IMPLANT
GLOVE BIOGEL PI IND STRL 7.0 (GLOVE) ×2 IMPLANT
GLOVE BIOGEL PI INDICATOR 7.0 (GLOVE) ×4
GLOVE ECLIPSE 6.5 STRL STRAW (GLOVE) ×3 IMPLANT
GLOVE EXAM NITRILE MD LF STRL (GLOVE) ×3 IMPLANT
GLOVE SKINSENSE NS SZ8.0 LF (GLOVE) ×4
GLOVE SKINSENSE STRL SZ8.0 LF (GLOVE) ×2 IMPLANT
GLOVE SS N UNI LF 8.5 STRL (GLOVE) ×3 IMPLANT
GOWN STRL REUS W/TWL LRG LVL3 (GOWN DISPOSABLE) ×6 IMPLANT
GOWN STRL REUS W/TWL XL LVL3 (GOWN DISPOSABLE) ×3 IMPLANT
HEAD FEM UNIPOLAR 44 OD STRL (Hips) IMPLANT
INST SET MAJOR BONE (KITS) ×3 IMPLANT
KIT BLADEGUARD II DBL (SET/KITS/TRAYS/PACK) ×3 IMPLANT
KIT ROOM TURNOVER APOR (KITS) ×3 IMPLANT
MANIFOLD NEPTUNE II (INSTRUMENTS) ×3 IMPLANT
MARKER SKIN DUAL TIP RULER LAB (MISCELLANEOUS) ×3 IMPLANT
NEEDLE HYPO 21X1.5 SAFETY (NEEDLE) ×3 IMPLANT
NS IRRIG 1000ML POUR BTL (IV SOLUTION) ×3 IMPLANT
PACK ARTHRO LIMB DRAPE STRL (MISCELLANEOUS) ×3 IMPLANT
PACK TOTAL JOINT (CUSTOM PROCEDURE TRAY) ×3 IMPLANT
PAD ARMBOARD 7.5X6 YLW CONV (MISCELLANEOUS) ×3 IMPLANT
PASSER SUT SWANSON 36MM LOOP (INSTRUMENTS) IMPLANT
PILLOW HIP ABDUCTION LRG (ORTHOPEDIC SUPPLIES) IMPLANT
PILLOW HIP ABDUCTION MED (ORTHOPEDIC SUPPLIES) IMPLANT
PILLOW HIP ABDUCTION SM (ORTHOPEDIC SUPPLIES) ×3 IMPLANT
PIN STMN 9X.142 IN (PIN) ×3 IMPLANT
PIN STMN SNGL STERILE 9X3.6MM (PIN) ×6 IMPLANT
SET BASIN LINEN APH (SET/KITS/TRAYS/PACK) ×3 IMPLANT
STAPLER VISISTAT 35W (STAPLE) ×3 IMPLANT
SUT BRALON NAB BRD #1 30IN (SUTURE) ×6 IMPLANT
SUT ETHIBOND 5 LR DA (SUTURE) ×6 IMPLANT
SUT MNCRL 0 VIOLET CTX 36 (SUTURE) ×2 IMPLANT
SUT MON AB 2-0 CT1 36 (SUTURE) IMPLANT
SUT MONOCRYL 0 CTX 36 (SUTURE) ×4
SUT VIC AB 1 CT1 27 (SUTURE) ×4
SUT VIC AB 1 CT1 27XBRD ANTBC (SUTURE) ×2 IMPLANT
SYR 30ML LL (SYRINGE) ×3 IMPLANT
SYR BULB IRRIGATION 50ML (SYRINGE) ×3 IMPLANT
TOWER CARTRIDGE SMART MIX (DISPOSABLE) IMPLANT
TRAY FOLEY CATH 16FR SILVER (SET/KITS/TRAYS/PACK) IMPLANT
WATER STERILE IRR 1000ML POUR (IV SOLUTION) ×6 IMPLANT
YANKAUER SUCT 12FT TUBE ARGYLE (SUCTIONS) ×3 IMPLANT

## 2014-02-24 NOTE — Clinical Social Work Note (Signed)
CSW spoke w family about [place] - facility may not be able to accept patient back at discharge into memory care unit.  Patient has Medicaid only coverage.  Family aware of issues, hope patient may be able to return to [facility] as it is familiar to patient; however, understand that patient's medical needs may require a higher level of care at discharge.  Family has spoken w [facility] and will take their bed if needed.  Edwyna Shell, LCSW Clinical Social Worker [phone])

## 2014-02-24 NOTE — Progress Notes (Signed)
02/24/14 1506 Notified Dr. Legrand Rams afternoon labs unable to be drawn after two attempts per lab personnel. Poor veins, unable to obtain. Stated okay. Lab to attempt again with am labs. Attempted to notify Dr Aline Brochure, not on call this afternoon per operator. Donavan Foil, RN

## 2014-02-24 NOTE — Progress Notes (Signed)
02/24/14 1655 Received call back from Dr. Aline Brochure this afternoon. Notified of lab unable to obtain blood for ordered labs due to poor veins. Stated okay. Lab to attempt am labs as ordered in the morning. Donavan Foil, RN

## 2014-02-24 NOTE — Transfer of Care (Signed)
Immediate Anesthesia Transfer of Care Note  Patient: [name]  Procedure(s) Performed: Procedure(s): ARTHROPLASTY BIPOLAR HIP (Right)  Patient Location: PACU  Anesthesia Type:Spinal  Level of Consciousness: awake  Airway & Oxygen Therapy: Patient Spontanous Breathing and Patient connected to face mask oxygen  Post-op Assessment: Report given to PACU RN  Post vital signs: Reviewed and stable 92.4  Complications: No apparent anesthesia complications

## 2014-02-24 NOTE — Op Note (Signed)
02/19/2014 - 02/24/2014  9:34 AM  PATIENT:  [name]  80 y.o. female  PRE-OPERATIVE DIAGNOSIS:  RIGHT FEMORAL NECK HIP FRACTURE, partial GLUTEUS MEDIAL TEAR  POST-OPERATIVE DIAGNOSIS:  RIGHT FEMORAL NECK HIP FRACTURE, partial GLUTEUS MEDIAL TEAR  PROCEDURE:  Procedure(s): ARTHROPLASTY BIPOLAR HIP (Right)  SURGEON:  Surgeon(s) and Role:    * Carole Civil, MD - Primary  PHYSICIAN ASSISTANT:   ASSISTANTS: cynthia wrenn   ANESTHESIA:   spinal  EBL:  Total I/O In: 1000 [I.V.:1000] Out: 290 [Urine:140; Blood:150]  BLOOD ADMINISTERED:none  DRAINS: none   LOCAL MEDICATIONS USED:  MARCAINE    and OTHER epi  SPECIMEN:  No Specimen  DISPOSITION OF SPECIMEN:  N/A  COUNTS:  YES  TOURNIQUET:  * No tourniquets in log *  DICTATION: .Dragon Dictation  PLAN OF CARE: Admit to inpatient   PATIENT DISPOSITION:  PACU - hemodynamically stable.   Delay start of Pharmacological VTE agent (>24hrs) due to surgical blood loss or risk of bleeding: yes  DETAILS OF PROCEDURE:  The patient was identified in the preop holding area and the surgical site right hip was marked and countersigned by the surgeon, the chart was updated. The consent was signed.  The patient was taken to the operating room for spinal anesthetic and then placed in the lateral decubitus position with appropriate padding and axillary roll. 1g ofVANCOMYCIN WAS given because of the patient's PENICILLIN ALLERGY After sterile prep and drape the timeout was executed  A lateral incision was made centered over the RIGHT greater trochanter to perform a direct lateral approach to the hip. After dividing the subcutaneous tissue down to the fascia, the fascia was split in line with the skin incision. Electrocautery was used to obtain hemostasis.   After deep retractors were placed, the gluteus medius was FOUND TO BE PARTIALLY AVULSED FROM THE GREATER TROCHANTER, THE REMAINING PORTION WAS defined and the REST OF THE  anterior half was peeled from the greater  trochanter ; the anterior branch to the femoral circumflex artery was cauterized. Subperiosteal dissection continued until the gluteus minimus along with the gluteus medius was retracted proximally.  2 Steinmann pins were placed in the pelvis to retract the Glutei. The hip was dislocated anteriorly, a provisional femoral neck cut was made using the cutting guide. The lesser trochanter was then identified with further soft tissue dissection and a second femoral neck cut was made using the same guide.   A box osteotome was used to lateralize entry point into the femoral canal, this was followed by a femoral canal finder and subsequent broaching up to a size 3 femur. THE HEAD WAS MEASURED TO SIZE 44  Trial reductions were then performed starting with SIZE 3 F  44 H 1.5 NECK LENGTH. Leg length and stability were restored with THESE IMPLANTS . We confirmed knee flexion past 90. ROM: 5 hyperextension with 50 of external rotation. We had adequate shuck test. Sleeping position was stable. At 90 flexion the hip internally rotated 50 without dislocation.  The trial components were then removed, drill holes were placed in the trochanter and  #5 suture was passed through the drill holes; the stem was placed followed by the femoral head.  The hip was reduced. The ROM tests were repeated and were satisfactory.The # 5 sutures were used to repair the abductors along with # 1 bralon with the leg slightly internally rotated.  The wound was irrigated and 30 cc of Marcaine with epinephrine was injected into the sub-gluteus  medius area. Fascia was closed with the leg abducted with #1 Bralon sutures; followed by subfascial injection of 30 cc of Marcaine with epinephrine.  Subcutaneous tissue was closed with 0 Monocryl Skin staples were used to reapproximate the skin edges and a sterile dressing was applied.  The patient was taken to the recovery room in stable  condition.  Standard postop total hip arthroplasty protocol for direct lateral approach.

## 2014-02-24 NOTE — Progress Notes (Signed)
AC attempted to regain IV access with vein finder, unsuccessful, MD made aware

## 2014-02-24 NOTE — Brief Op Note (Signed)
02/19/2014 - 02/24/2014  9:34 AM  PATIENT:  [name]  80 y.o. female  PRE-OPERATIVE DIAGNOSIS:  RIGHT FEMORAL NECK HIP FRACTURE, partial GLUTEUS MEDIAL TEAR  POST-OPERATIVE DIAGNOSIS:  RIGHT FEMORAL NECK HIP FRACTURE, partial GLUTEUS MEDIAL TEAR  PROCEDURE:  Procedure(s): ARTHROPLASTY BIPOLAR HIP (Right)  SURGEON:  Surgeon(s) and Role:    * Carole Civil, MD - Primary  PHYSICIAN ASSISTANT:   ASSISTANTS: cynthia wrenn   ANESTHESIA:   spinal  EBL:  Total I/O In: 1000 [I.V.:1000] Out: 290 [Urine:140; Blood:150]  BLOOD ADMINISTERED:none  DRAINS: none   LOCAL MEDICATIONS USED:  MARCAINE    and OTHER epi  SPECIMEN:  No Specimen  DISPOSITION OF SPECIMEN:  N/A  COUNTS:  YES  TOURNIQUET:  * No tourniquets in log *  DICTATION: .Dragon Dictation  PLAN OF CARE: Admit to inpatient   PATIENT DISPOSITION:  PACU - hemodynamically stable.   Delay start of Pharmacological VTE agent (>24hrs) due to surgical blood loss or risk of bleeding: yes  DETAILS OF PROCEDURE:  The patient was identified in the preop holding area and the surgical site right hip was marked and countersigned by the surgeon, the chart was updated. The consent was signed.  The patient was taken to the operating room for spinal anesthetic and then placed in the lateral decubitus position with appropriate padding and axillary roll. 1g ofVANCOMYCIN WAS given because of the patient's PENICILLIN ALLERGY After sterile prep and drape the timeout was executed  A lateral incision was made centered over the RIGHT greater trochanter to perform a direct lateral approach to the hip. After dividing the subcutaneous tissue down to the fascia, the fascia was split in line with the skin incision. Electrocautery was used to obtain hemostasis.   After deep retractors were placed, the gluteus medius was FOUND TO BE PARTIALLY AVULSED FROM THE GREATER TROCHANTER, THE REMAINING PORTION WAS defined and the REST OF THE  anterior half was peeled from the greater  trochanter ; the anterior branch to the femoral circumflex artery was cauterized. Subperiosteal dissection continued until the gluteus minimus along with the gluteus medius was retracted proximally.  2 Steinmann pins were placed in the pelvis to retract the Glutei. The hip was dislocated anteriorly, a provisional femoral neck cut was made using the cutting guide. The lesser trochanter was then identified with further soft tissue dissection and a second femoral neck cut was made using the same guide.   A box osteotome was used to lateralize entry point into the femoral canal, this was followed by a femoral canal finder and subsequent broaching up to a size 3 femur. THE HEAD WAS MEASURED TO SIZE 44  Trial reductions were then performed starting with SIZE 3 F  44 H 1.5 NECK LENGTH. Leg length and stability were restored with THESE IMPLANTS . We confirmed knee flexion past 90. ROM: 5 hyperextension with 50 of external rotation. We had adequate shuck test. Sleeping position was stable. At 90 flexion the hip internally rotated 50 without dislocation.  The trial components were then removed, drill holes were placed in the trochanter and  #5 suture was passed through the drill holes; the stem was placed followed by the femoral head.  The hip was reduced. The ROM tests were repeated and were satisfactory.The # 5 sutures were used to repair the abductors along with # 1 bralon with the leg slightly internally rotated.  The wound was irrigated and 30 cc of Marcaine with epinephrine was injected into the sub-gluteus  medius area. Fascia was closed with the leg abducted with #1 Bralon sutures; followed by subfascial injection of 30 cc of Marcaine with epinephrine.  Subcutaneous tissue was closed with 0 Monocryl Skin staples were used to reapproximate the skin edges and a sterile dressing was applied.  The patient was taken to the recovery room in stable  condition.  Standard postop total hip arthroplasty protocol for direct lateral approach. 

## 2014-02-24 NOTE — Anesthesia Postprocedure Evaluation (Signed)
  Anesthesia Post-op Note  Patient: [name]  Procedure(s) Performed: Procedure(s): ARTHROPLASTY BIPOLAR HIP (Right)  Patient Location: PACU  Anesthesia Type:Spinal  Level of Consciousness: awake  Airway and Oxygen Therapy: Patient Spontanous Breathing and Patient connected to face mask oxygen  Post-op Pain: none  Post-op Assessment: Post-op Vital signs reviewed, Patient's Cardiovascular Status Stable, Respiratory Function Stable, Patent Airway and No signs of Nausea or vomiting  Post-op Vital Signs: Reviewed and stable  Last Vitals:  Filed Vitals:   02/24/14 0931  BP:   Pulse:   Temp: 36.4 C  Resp:     Complications: No apparent anesthesia complications

## 2014-02-24 NOTE — Addendum Note (Signed)
Addendum created 02/24/14 1058 by Ollen Bowl, CRNA   Modules edited: Anesthesia Flowsheet

## 2014-02-24 NOTE — Anesthesia Procedure Notes (Signed)
Spinal  Patient location during procedure: OR Start time: 02/24/2014 7:51 AM Staffing CRNA/Resident: Tressie Stalker E Preanesthetic Checklist Completed: patient identified, site marked, surgical consent, pre-op evaluation, timeout performed, IV checked, risks and benefits discussed and monitors and equipment checked Spinal Block Patient position: right lateral decubitus Prep: Betadine Patient monitoring: heart rate, cardiac monitor, continuous pulse ox and blood pressure Approach: right paramedian Location: L3-4 Injection technique: single-shot Needle Needle type: Spinocan  Needle gauge: 22 G Needle length: 9 cm Assessment Sensory level: T8 Additional Notes ATTEMPTS:1 TRAY HT:[id number] TRAY EXPIRATION DATE:11/2014

## 2014-02-24 NOTE — Anesthesia Preprocedure Evaluation (Signed)
Anesthesia Evaluation  Patient identified by MRN, date of birth, ID band Patient confused  General Assessment Comment:Ox0, dementia  Reviewed: Allergy & Precautions, H&P , NPO status , Patient's Chart, lab work & pertinent test results, Unable to perform ROS - Chart review only  Airway Mallampati: II TM Distance: >3 FB     Dental  (+) Partial Upper, Poor Dentition   Pulmonary  Hx interstitial pulm dx  breath sounds clear to auscultation        Cardiovascular hypertension, Pt. on medications + CAD (hx of PCI) + dysrhythmias Atrial Fibrillation Rhythm:Regular Rate:Normal     Neuro/Psych PSYCHIATRIC DISORDERS (dementia) Anxiety Depression    GI/Hepatic GERD-  Medicated,  Endo/Other    Renal/GU      Musculoskeletal   Abdominal   Peds  Hematology   Anesthesia Other Findings   Reproductive/Obstetrics                           Anesthesia Physical Anesthesia Plan  ASA: IV  Anesthesia Plan: Spinal and MAC   Post-op Pain Management:    Induction: Intravenous  Airway Management Planned: Simple Face Mask  Additional Equipment:   Intra-op Plan:   Post-operative Plan:   Informed Consent: I have reviewed the patients History and Physical, chart, labs and discussed the procedure including the risks, benefits and alternatives for the proposed anesthesia with the patient or authorized representative who has indicated his/her understanding and acceptance.     Plan Discussed with:   Anesthesia Plan Comments:         Anesthesia Quick Evaluation

## 2014-02-25 LAB — PREPARE PLATELET PHERESIS
UNIT DIVISION: 0
Unit division: 0
Unit division: 0

## 2014-02-25 LAB — CBC
HEMATOCRIT: 37 % (ref 36.0–46.0)
Hemoglobin: 12.5 g/dL (ref 12.0–15.0)
MCH: 32.1 pg (ref 26.0–34.0)
MCHC: 33.8 g/dL (ref 30.0–36.0)
MCV: 95.1 fL (ref 78.0–100.0)
Platelets: 193 10*3/uL (ref 150–400)
RBC: 3.89 MIL/uL (ref 3.87–5.11)
RDW: 13.2 % (ref 11.5–15.5)
WBC: 12.5 10*3/uL — AB (ref 4.0–10.5)

## 2014-02-25 LAB — BASIC METABOLIC PANEL
BUN: 18 mg/dL (ref 6–23)
CHLORIDE: 105 meq/L (ref 96–112)
CO2: 23 mEq/L (ref 19–32)
Calcium: 8.5 mg/dL (ref 8.4–10.5)
Creatinine, Ser: 0.83 mg/dL (ref 0.50–1.10)
GFR calc Af Amer: 68 mL/min — ABNORMAL LOW (ref >90)
GFR calc non Af Amer: 59 mL/min — ABNORMAL LOW (ref >90)
Glucose, Bld: 143 mg/dL — ABNORMAL HIGH (ref 70–99)
Potassium: 4.3 mEq/L (ref 3.7–5.3)
Sodium: 139 mEq/L (ref 137–147)

## 2014-02-25 MED ORDER — BOOST / RESOURCE BREEZE PO LIQD
1.0000 | Freq: Three times a day (TID) | ORAL | Status: DC
  Administered 2014-02-25 – 2014-02-26 (×2): 1 via ORAL

## 2014-02-25 NOTE — Evaluation (Signed)
Physical Therapy Evaluation Patient Details Name: [name] [place] MRN: [id number] DOB: [birth year] Today's Date: 02/25/2014   History of Present Illness  Pt is a resident of [facility] in the dementia unit.  She fell in the bathroom on 02-19-14 and sustained a right hip fx.  She underwent replacement of the femoral component on 02-24-14.  She is now referred for PT following a standard, lateral approach protocol.  Clinical Impression   Pt was seen for initial eva/tx.  She was very pleasant but disoriented to everything but her name.  She was unable to follow directions but she did not resist tx.  She was medicated for pain prior to my visit but she continued to c/o pain with any movement in the bed.  We initiated therapeutic exercise per protocol for which she had only fair tolerance to the RLE.   She required total assist with all mobility and transfers.  She will need to transfer to SNF at d/c in an attempt to regain her ability to ambulate.    Follow Up Recommendations SNF    Equipment Recommendations  None recommended by PT    Recommendations for Other Services  none     Precautions / Restrictions Precautions Precautions: Anterior Hip Precaution Booklet Issued: No Precaution Comments: advanced dementia Restrictions Weight Bearing Restrictions: No      Mobility  Bed Mobility Overal bed mobility: Needs Assistance Bed Mobility: Supine to Sit     Supine to sit: Total assist        Transfers Overall transfer level: Needs assistance Equipment used: None Transfers: Stand Pivot Transfers   Stand pivot transfers: Total assist       General transfer comment: pt unable to assist with transfer but she did not resist  Ambulation/Gait:  N/A                Stairs:  N/A                   Balance Overall balance assessment: Needs assistance Sitting-balance support: Feet supported;No upper extremity supported Sitting balance-Leahy Scale: Poor                                             Home Living Family/patient expects to be discharged to:: Skilled nursing facility                      Prior Function Level of Independence: Needs assistance   Gait / Transfers Assistance Needed: pt was ambulatory but I do not know if assistive device was needed  ADL's / Homemaking Assistance Needed: total assist                Extremity/Trunk Assessment               Lower Extremity Assessment: RLE deficits/detail RLE Deficits / Details: limited hip flexion due to pain, unable to assess strength due to pain       Communication   Communication: No difficulties  Cognition Arousal/Alertness: Lethargic Behavior During Therapy: WFL for tasks assessed/performed Overall Cognitive Status: History of cognitive impairments - at baseline                            Exercises General Exercises - Lower Extremity Ankle Circles/Pumps: AROM;Both;10 reps;Supine Short Arc Quad: AAROM;Both;5 reps;Supine Heel Slides: AAROM;Both;5  reps;Supine Hip ABduction/ADduction: AAROM;Left;5 reps;Supine      Assessment/Plan    PT Assessment Patient needs continued PT services  PT Diagnosis Difficulty walking;Generalized weakness;Acute pain   PT Problem List Decreased strength;Decreased range of motion;Decreased activity tolerance;Decreased balance;Decreased mobility;Decreased cognition;Decreased knowledge of precautions  PT Treatment Interventions Functional mobility training;Therapeutic exercise   PT Goals (Current goals can be found in the Care Plan section) Acute Rehab PT Goals Patient Stated Goal: none stated PT Goal Formulation: Patient unable to participate in goal setting    Frequency Min 5X/week   Barriers to discharge   none                   End of Session Equipment Utilized During Treatment: Gait belt Activity Tolerance: Patient limited by pain Patient left: in chair;with call bell/phone  within reach;with chair alarm set Nurse Communication: Mobility status         Time: 0931-1000 PT Time Calculation (min): 29 min   Charges:   PT Evaluation $Initial PT Evaluation Tier I: 1 Procedure PT Treatments $Therapeutic Exercise: 8-22 mins   PT G Codes:          Sable Feil 02/25/2014, 10:34 AM

## 2014-02-25 NOTE — Anesthesia Postprocedure Evaluation (Signed)
  Anesthesia Post-op Note  Patient: [name]  Procedure(s) Performed: Procedure(s): ARTHROPLASTY BIPOLAR HIP (Right)  Patient Location: Nursing Unit  Anesthesia Type:Spinal  Level of Consciousness: awake  Airway and Oxygen Therapy: Patient Spontanous Breathing  Post-op Pain: mild  Post-op Assessment: Post-op Vital signs reviewed, Patient's Cardiovascular Status Stable, Respiratory Function Stable, Patent Airway and No signs of Nausea or vomiting  Post-op Vital Signs: Reviewed and stable  Last Vitals:  Filed Vitals:   02/25/14 0546  BP: 112/60  Pulse: 98  Temp: 36.7 C  Resp: 18    Complications: No apparent anesthesia complications

## 2014-02-25 NOTE — Progress Notes (Signed)
Subjective: Patient is resting. She had arthroplasty yesterday. No new complaint. Objective: Vital signs in last 24 hours: Temp:  [97.5 F (36.4 C)-98.8 F (37.1 C)] 98 F (36.7 C) (04/14 0546) Pulse Rate:  [80-108] 98 (04/14 0546) Resp:  [16-20] 18 (04/14 0546) BP: (96-162)/(41-78) 112/60 mmHg (04/14 0546) SpO2:  [97 %-100 %] 100 % (04/14 0546) Weight change:  Last BM Date: 02/24/14  Intake/Output from previous day: 04/13 0701 - 04/14 0700 In: 1588.8 [I.V.:1488.8; IV Piggyback:100] Out: 590 [Urine:440; Blood:150]  PHYSICAL EXAM General appearance: slowed mentation Resp: clear to auscultation bilaterally Cardio: S1, S2 normal GI: soft, non-tender; bowel sounds normal; no masses,  no organomegaly Extremities: rt hip incision site is dressed  Lab Results:    @labtest @ [facility] No results found for this basename: PHART, PCO2, PO2ART, TCO2, HCO3,  in the last 72 hours CULTURES Recent Results (from the past 240 hour(s))  MRSA PCR SCREENING     Status: None   Collection Time    02/20/14  6:08 AM      Result Value Ref Range Status   MRSA by PCR NEGATIVE  NEGATIVE Final   Comment:            The GeneXpert MRSA Assay (FDA     approved for NASAL specimens     only), is one component of a     comprehensive MRSA colonization     surveillance program. It is not     intended to diagnose MRSA     infection nor to guide or     monitor treatment for     MRSA infections.  SURGICAL PCR SCREEN     Status: Abnormal   Collection Time    02/23/14  6:24 PM      Result Value Ref Range Status   MRSA, PCR NEGATIVE  NEGATIVE Final   Staphylococcus aureus POSITIVE (*) NEGATIVE Final   Comment:            The Xpert SA Assay (FDA     approved for NASAL specimens     in patients over 51 years of age),     is one component of     a comprehensive surveillance     program.  Test performance has     been validated by [employer] for patients greater     than or equal to 47 year old.    It is not intended     to diagnose infection nor to     guide or monitor treatment.     RESULT CALLED TO, READ BACK BY AND VERIFIED WITH:     L.COVINGTON AT 2245 ON 02/23/14 BY S.VANHOORNE   Studies/Results: Dg Pelvis Portable  02/24/2014   CLINICAL DATA:  Post right total hip arthroplasty  EXAM: PORTABLE PELVIS 1-2 VIEWS  COMPARISON:  Portable exam 0957 hr compared to 02/20/2014  FINDINGS: Interval right hip arthroplasty.  No acute fracture or dislocation.  Diffuse osseous demineralization.  Expected postsurgical soft tissue changes.  Left hip prosthesis stable.  IMPRESSION: Right hip prosthesis without acute complication.   Electronically Signed   By: Lavonia Dana M.D.   On: 02/24/2014 10:29    Medications: I have reviewed the patient's current medications.  Assesment: Principal Problem:   Fracture of femoral neck, right, closed Active Problems:   Paroxysmal atrial fibrillation   Hypertension   Arteriosclerotic cardiovascular disease (ASCVD)   Dementia   Closed right hip fracture S/P rt hip arthroplasty  Plan: Medications reviewed Continue pain management As per orthopedics plan.    LOS: 6 days   Dare Sanger 02/25/2014, 7:40 AM

## 2014-02-25 NOTE — Progress Notes (Signed)
Physical Therapy Treatment Patient Details Name: [name] MRN: [id number] DOB: 1917-04-22 Today's Date: 02/25/2014    History of Present Illness Pt was up in a chair for 1 hour 20 min.  Fair tolerance to sitting, needed to use BSC.    PT Comments    Pt is somewhat agitated at this time and having increased hip pain.  RN was alerted and medicated pt.  Pt was instructed in a transfer from chair to [facility], needed max assist.  She was able to stand to a walker while perineal region was cleaned.   Total assist needed for chair to bed transfer.  She was too fatigued for any exercise.  Follow Up Recommendations  SNF     Equipment Recommendations  None recommended by PT           Precautions / Restrictions Precautions Precautions: Anterior Hip Precaution Booklet Issued: No Precaution Comments: advanced dementia Restrictions Weight Bearing Restrictions: No    Mobility  Bed Mobility Overal bed mobility: Needs Assistance Bed Mobility: Sit to Supine     Supine to sit: Total assist Sit to supine: Total assist      Transfers Overall transfer level: Needs assistance Equipment used: Rolling walker (2 wheeled) Transfers: Sit to/from Stand Sit to Stand: Max assist Stand pivot transfers: Total assist       General transfer comment: pt able to stand with a walker from [facility] in order for perineal area to be cleaned...hips and knees at about 45 degrees of flexion                                        Balance Overall balance assessment: Needs assistance Sitting-balance support: Feet supported;No upper extremity supported Sitting balance-Leahy Scale: Poor                              Cognition Arousal/Alertness: Awake/alert Behavior During Therapy: WFL for tasks assessed/performed Overall Cognitive Status: History of cognitive impairments - at baseline                      Exercises General Exercises - Lower Extremity Ankle  Circles/Pumps: AROM;Both;10 reps;Supine Short Arc Quad: AAROM;Both;5 reps;Supine Heel Slides: AAROM;Both;5 reps;Supine Hip ABduction/ADduction: AAROM;Left;5 reps;Supine                 Home Living Family/patient expects to be discharged to:: Skilled nursing facility                    Prior Function Level of Independence: Needs assistance  Gait / Transfers Assistance Needed: pt was ambulatory but I do not know if assistive device was needed ADL's / Homemaking Assistance Needed: total assist     PT Goals (current goals can now be found in the care plan section) Acute Rehab PT Goals Patient Stated Goal: none stated PT Goal Formulation: Patient unable to participate in goal setting Progress towards PT goals: Progressing toward goals    Frequency  Min 5X/week    PT Plan Current plan remains appropriate                 End of Session Equipment Utilized During Treatment: Gait belt Activity Tolerance: Patient limited by fatigue;Patient limited by pain Patient left: in bed;with call bell/phone within reach;with bed alarm set;with nursing/sitter in room;with family/visitor present  Time: 1120-1208 PT Time Calculation (min): 48 min  Charges:  $Therapeutic Exercise: 8-22 mins $Therapeutic Activity: 8-22 mins                    G Codes:      Sable Feil 03-06-14, 12:16 PM

## 2014-02-25 NOTE — Progress Notes (Signed)
Subjective: 1 Day Post-Op Procedure(s) (LRB): ARTHROPLASTY BIPOLAR HIP (Right) Patient reports pain as mild.       Recent Labs  02/25/14 0554  HGB 12.5    Recent Labs  02/25/14 0554  WBC 12.5*  RBC 3.89  HCT 37.0  PLT 193    Recent Labs  02/25/14 0554  NA 139  K 4.3  CL 105  CO2 23  BUN 18  CREATININE 0.83  GLUCOSE 143*  CALCIUM 8.5    Recent Labs  02/23/14 1934  INR 1.12    significant confusion at this point dressing is clean dry and intact Start therapy Assessment/Plan: 1 Day Post-Op Procedure(s) (LRB): ARTHROPLASTY BIPOLAR HIP (Right) Up with therapy  [name] 02/25/2014, 10:01 AM

## 2014-02-25 NOTE — Addendum Note (Signed)
Addendum created 02/25/14 1149 by Ollen Bowl, CRNA   Modules edited: Notes Section   Notes Section:  File: [id number]

## 2014-02-25 NOTE — Progress Notes (Signed)
INITIAL NUTRITION ASSESSMENT  DOCUMENTATION CODES Per approved criteria  -Not Applicable   INTERVENTION: Resource Breeze po TID, each supplement provides 250 kcal and 9 grams of protein  NUTRITION DIAGNOSIS: Inadequate oral intake related to decreased appetite as evidenced by PO: 0-50%.   Goal: Pt will meet >90% of estimated nutritional needs  Monitor:  Diet advancement, PO intake, labs, skin assessments, weight changes, I/O's  Reason for Assessment: LOS, poor po's  80 y.o. female  Admitting Dx: Fracture of femoral neck, right, closed  ASSESSMENT: Pt is a resident of [place] who was admitted for rt femoral neck fx, s/p fall. S/p rt hip arthroplasty on 02/24/14.  Pt is currently on a clear liquid diet. Prior to surgery, pt was receiving a regular diet. PO intake has been poor throughout admission (PO: 5-50%). Per MAR, pt receives 30 ml Prostat BID at [facility], however, pt has no nutritional supplements ordered at this time.  Unable to arouse pt at time of visit and no family present. Brief physical exam revealed no depletion on temple, orbital or clavicle areas.  Wt hx reveals distant hx of weight loss, however, not significant for time frame given. Pt's weight has been stable over the past year.  Pt does not meet criteria for malnutrition at this time, however, is at risk given advanced age and poor po intake. Discharge disposition is d/c to SNF [facility]) when medically stable, as pt requires a high level of care.   Height: Ht Readings from Last 1 Encounters:  02/20/14 5' 2.99" (1.6 m)    Weight: Wt Readings from Last 1 Encounters:  02/20/14 109 lb 12.8 oz (49.805 kg)    Ideal Body Weight: 115#  % Ideal Body Weight: 95%  Wt Readings from Last 10 Encounters:  02/20/14 109 lb 12.8 oz (49.805 kg)  02/20/14 109 lb 12.8 oz (49.805 kg)  01/01/13 110 lb (49.896 kg)  06/21/12 104 lb (47.174 kg)  06/08/12 105 lb (47.628 kg)  05/13/12 105 lb 6.1 oz (47.8 kg)   01/20/12 116 lb (52.617 kg)  11/18/11 116 lb (52.617 kg)  09/06/11 120 lb 9.5 oz (54.7 kg)    Usual Body Weight: 120#  % Usual Body Weight: 91%  BMI:  Body mass index is 19.46 kg/(m^2). Meets criteria for normal weight.  Estimated Nutritional Needs: Kcal: 1300-1500 daily Protein: 40-50 grams daily Fluid: 1.3-1.5 L daily  Skin: closed rt hip inscion  Diet Order: Clear Liquid  EDUCATION NEEDS: -Education not appropriate at this time   Intake/Output Summary (Last 24 hours) at 02/25/14 1240 Last data filed at 02/24/14 1909  Gross per 24 hour  Intake 588.75 ml  Output    300 ml  Net 288.75 ml    Last BM: 02/24/14   Labs:   Recent Labs Lab 02/19/14 2350 02/22/14 0544 02/25/14 0554  NA 147 143 139  K 4.4 3.9 4.3  CL 107 105 105  CO2 28 26 23   BUN 42* 21 18  CREATININE 1.44* 0.81 0.83  CALCIUM 9.5 9.0 8.5  GLUCOSE 129* 118* 143*    CBG (last 3)  No results found for this basename: GLUCAP,  in the last 72 hours  Scheduled Meds: . amLODipine  5 mg Oral Daily  . antiseptic oral rinse  15 mL Mouth Rinse BID  . benazepril  20 mg Oral Daily  . divalproex  125 mg Oral QHS  . docusate sodium  100 mg Oral BID  . enoxaparin (LOVENOX) injection  40 mg Subcutaneous  Q24H  . metoprolol succinate  50 mg Oral Daily  . mupirocin ointment  1 application Nasal BID  . polyethylene glycol  17 g Oral Daily  . senna  1 tablet Oral BID    Continuous Infusions: . sodium chloride 75 mL/hr at 02/24/14 1200    Past Medical History  Diagnosis Date  . Carcinoma of breast 2008    Right mastectomy  . Paroxysmal atrial fibrillation   . Hypertension   . Anxiety   . Arteriosclerotic cardiovascular disease (ASCVD)     Multiple prior PCI, most recently in 2007  . Dementia   . Gastroesophageal reflux disease   . Gout   . Hip fracture, left 2011  . Acute gastroenteritis   . Interstitial pulmonary disease   . Bronchitis, mucopurulent recurrent   . Anxiety   . Depression   .  UTI (lower urinary tract infection)     Past Surgical History  Procedure Laterality Date  . Orif hip fracture  2011  . Cataract extraction    . Mastectomy  2008    Right    Lura Falor A. Jimmye Norman, RD, LDN Pager: [phone]

## 2014-02-26 ENCOUNTER — Encounter (HOSPITAL_COMMUNITY): Payer: Self-pay | Admitting: Orthopedic Surgery

## 2014-02-26 LAB — BASIC METABOLIC PANEL
BUN: 20 mg/dL (ref 6–23)
CALCIUM: 8.4 mg/dL (ref 8.4–10.5)
CO2: 22 meq/L (ref 19–32)
CREATININE: 0.78 mg/dL (ref 0.50–1.10)
Chloride: 109 mEq/L (ref 96–112)
GFR calc Af Amer: 80 mL/min — ABNORMAL LOW (ref >90)
GFR calc non Af Amer: 69 mL/min — ABNORMAL LOW (ref >90)
Glucose, Bld: 92 mg/dL (ref 70–99)
Potassium: 4.2 mEq/L (ref 3.7–5.3)
Sodium: 143 mEq/L (ref 137–147)

## 2014-02-26 LAB — CBC
HCT: 33.7 % — ABNORMAL LOW (ref 36.0–46.0)
HEMOGLOBIN: 11.4 g/dL — AB (ref 12.0–15.0)
MCH: 32.4 pg (ref 26.0–34.0)
MCHC: 33.8 g/dL (ref 30.0–36.0)
MCV: 95.7 fL (ref 78.0–100.0)
Platelets: 216 10*3/uL (ref 150–400)
RBC: 3.52 MIL/uL — AB (ref 3.87–5.11)
RDW: 13.6 % (ref 11.5–15.5)
WBC: 14.9 10*3/uL — ABNORMAL HIGH (ref 4.0–10.5)

## 2014-02-26 MED ORDER — SODIUM CHLORIDE 0.9 % IV BOLUS (SEPSIS)
250.0000 mL | Freq: Once | INTRAVENOUS | Status: AC
  Administered 2014-02-26: 250 mL via INTRAVENOUS

## 2014-02-26 MED ORDER — AMLODIPINE BESYLATE 5 MG PO TABS
5.0000 mg | ORAL_TABLET | Freq: Every day | ORAL | Status: DC
  Filled 2014-02-26: qty 1

## 2014-02-26 MED ORDER — METOPROLOL SUCCINATE ER 50 MG PO TB24
50.0000 mg | ORAL_TABLET | Freq: Every day | ORAL | Status: DC
  Filled 2014-02-26: qty 1

## 2014-02-26 NOTE — Progress Notes (Signed)
02/26/14 1347 Nursing noticed PT note stating patient experienced dizziness during therapy consult. BP 82/42 manually, patient sleepy but arousable. Notified Dr. Legrand Rams. Orders received to hold norvasc and metoprolol for SBP <100. NS 250 ml bolus IV x one. Nursing to monitor. Donavan Foil, RN

## 2014-02-26 NOTE — Clinical Social Work Note (Signed)
CSW updated [facility] re patient status.  Left VM for family to update, plan continues to be discharge to [facility] for rehab when medically stable.  Edwyna Shell, LCSW Clinical Social Worker [phone])

## 2014-02-26 NOTE — Progress Notes (Signed)
Subjective: Patient is resting. Physical therapy is working with the patient. No new complaint. Objective: Vital signs in last 24 hours: Temp:  [97.3 F (36.3 C)-98 F (36.7 C)] 97.3 F (36.3 C) (04/15 0656) Pulse Rate:  [96-101] 96 (04/15 0656) Resp:  [18-20] 18 (04/15 0800) BP: (118-144)/(67-82) 118/77 mmHg (04/15 0656) SpO2:  [93 %-99 %] 97 % (04/15 0656) Weight change:  Last BM Date: 02/25/14  Intake/Output from previous day: 04/14 0701 - 04/15 0700 In: 10 [P.O.:10] Out: -   PHYSICAL EXAM General appearance: slowed mentation Resp: clear to auscultation bilaterally Cardio: S1, S2 normal GI: soft, non-tender; bowel sounds normal; no masses,  no organomegaly Extremities: rt hip incision site is dressed  Lab Results:    @labtest @ [facility] No results found for this basename: PHART, PCO2, PO2ART, TCO2, HCO3,  in the last 72 hours CULTURES Recent Results (from the past 240 hour(s))  MRSA PCR SCREENING     Status: None   Collection Time    02/20/14  6:08 AM      Result Value Ref Range Status   MRSA by PCR NEGATIVE  NEGATIVE Final   Comment:            The GeneXpert MRSA Assay (FDA     approved for NASAL specimens     only), is one component of a     comprehensive MRSA colonization     surveillance program. It is not     intended to diagnose MRSA     infection nor to guide or     monitor treatment for     MRSA infections.  SURGICAL PCR SCREEN     Status: Abnormal   Collection Time    02/23/14  6:24 PM      Result Value Ref Range Status   MRSA, PCR NEGATIVE  NEGATIVE Final   Staphylococcus aureus POSITIVE (*) NEGATIVE Final   Comment:            The Xpert SA Assay (FDA     approved for NASAL specimens     in patients over 37 years of age),     is one component of     a comprehensive surveillance     program.  Test performance has     been validated by [employer] for patients greater     than or equal to 50 year old.     It is not intended     to diagnose  infection nor to     guide or monitor treatment.     RESULT CALLED TO, READ BACK BY AND VERIFIED WITH:     L.COVINGTON AT 2245 ON 02/23/14 BY S.VANHOORNE   Studies/Results: Dg Pelvis Portable  02/24/2014   CLINICAL DATA:  Post right total hip arthroplasty  EXAM: PORTABLE PELVIS 1-2 VIEWS  COMPARISON:  Portable exam 0957 hr compared to 02/20/2014  FINDINGS: Interval right hip arthroplasty.  No acute fracture or dislocation.  Diffuse osseous demineralization.  Expected postsurgical soft tissue changes.  Left hip prosthesis stable.  IMPRESSION: Right hip prosthesis without acute complication.   Electronically Signed   By: Lavonia Dana M.D.   On: 02/24/2014 10:29    Medications: I have reviewed the patient's current medications.  Assesment: Principal Problem:   Fracture of femoral neck, right, closed Active Problems:   Paroxysmal atrial fibrillation   Hypertension   Arteriosclerotic cardiovascular disease (ASCVD)   Dementia   Closed right hip fracture S/P rt hip arthroplasty  Plan: Medications reviewed Continue pain management Continue physical therapy    LOS: 7 days   [name] 02/26/2014, 8:34 AM

## 2014-02-26 NOTE — Progress Notes (Signed)
Patient ID: [name], female   DOB: September 22, 1917, 80 y.o.   MRN: [id number]  Chief Complaint  Patient presents with  . Fall   Partial hip replacement   Stable   Confused   Surgically ok to resume plavix   PT as tolerated

## 2014-02-26 NOTE — Progress Notes (Signed)
Physical Therapy Treatment Patient Details Name: [name] MRN: [id number] DOB: November 03, 1917 Today's Date: 02/26/2014    History of Present Illness Patient noted ezxtreme dizziness during therapy limitin ability to participate in session. Patient attempted sitting at edge of bed for 54minn, but dizziness did not improve and patient firmly stated desire to lay back down  not allowing therapist o help her stand up.     PT Comments    Therapy session limited by patient experiencing extreme dizziness that only mildly improved while sitting at edge of bed. Supine to sit transfer continues to require total assistance with patient unable to move bilateral LE secondary to pain and weakness. Current plan of care remains appropriate.            Precautions / Restrictions Precautions Precautions: Anterior Hip Precaution Comments: advanced dementia Restrictions Weight Bearing Restrictions: No    Mobility  Bed Mobility Overal bed mobility: Needs Assistance Bed Mobility: Sit to Supine     Supine to sit: Total assist Sit to supine: Total assist      Transfers Overall transfer level: Needs assistance Equipment used: Rolling walker (2 wheeled) Transfers: Sit to/from Stand Sit to Stand: Max assist Stand pivot transfers: Total assist            Balance  requires max assist to maintain sitting at edge of bed secondary to weakness and dizziness.                                   Cognition Arousal/Alertness: Lethargic Behavior During Therapy: WFL for tasks assessed/performed Overall Cognitive Status: History of cognitive impairments - at baseline                      Exercises General Exercises - Lower Extremity Ankle Circles/Pumps: AROM;Both;10 reps;Supine Short Arc Quad: AAROM;Both;5 reps;Supine Heel Slides: AAROM;Both;5 reps;Supine Hip ABduction/ADduction: AAROM;Left;5 reps;Supine    General Comments        Pertinent Vitals/Pain 4/10            PT Goals (current goals can now be found in the care plan section) Acute Rehab PT Goals Patient Stated Goal: none stated PT Goal Formulation: Patient unable to participate in goal setting Progress towards PT goals: Progressing toward goals       PT Plan Current plan remains appropriate       End of Session Equipment Utilized During Treatment: Gait belt Activity Tolerance: Patient limited by fatigue;Patient limited by pain Patient left: in bed;with call bell/phone within reach;with bed alarm set     Time: 8841-6606 PT Time Calculation (min): 24 min  Charges:  $Therapeutic Exercise: 8-22 mins                      Javian Nudd R Margree Gimbel PT DPT 02/26/2014, 12:06 PM

## 2014-02-27 ENCOUNTER — Inpatient Hospital Stay
Admission: RE | Admit: 2014-02-27 | Discharge: 2014-08-17 | Disposition: A | Discharge status: Other Acute Inpatient Hospital | Payer: Medicare Other | Source: Ambulatory Visit | Attending: Internal Medicine | Admitting: Internal Medicine

## 2014-02-27 DIAGNOSIS — S72001A Fracture of unspecified part of neck of right femur, initial encounter for closed fracture: Principal | ICD-10-CM

## 2014-02-27 LAB — TYPE AND SCREEN
ABO/RH(D): O NEG
Antibody Screen: NEGATIVE
UNIT DIVISION: 0
Unit division: 0
Unit division: 0

## 2014-02-27 LAB — CBC
HCT: 34.9 % — ABNORMAL LOW (ref 36.0–46.0)
Hemoglobin: 11.5 g/dL — ABNORMAL LOW (ref 12.0–15.0)
MCH: 32.2 pg (ref 26.0–34.0)
MCHC: 33 g/dL (ref 30.0–36.0)
MCV: 97.8 fL (ref 78.0–100.0)
PLATELETS: 275 10*3/uL (ref 150–400)
RBC: 3.57 MIL/uL — AB (ref 3.87–5.11)
RDW: 13.9 % (ref 11.5–15.5)
WBC: 13.5 10*3/uL — ABNORMAL HIGH (ref 4.0–10.5)

## 2014-02-27 MED ORDER — ENOXAPARIN SODIUM 40 MG/0.4ML ~~LOC~~ SOLN: 40.0000 mg | SUBCUTANEOUS | Status: DC

## 2014-02-27 MED ORDER — HYDROCODONE-ACETAMINOPHEN 5-325 MG PO TABS: 1.0000 | ORAL_TABLET | Freq: Two times a day (BID) | ORAL | Status: DC

## 2014-02-27 NOTE — Discharge Summary (Signed)
Physician Discharge Summary  Patient ID: [name] MRN: [id number] DOB/AGE: May 27, 1917 80 y.o. Primary Care Physician:Jenasis Straley, MD Admit date: 02/19/2014 Discharge date: 02/27/2014    Discharge Diagnoses:   Principal Problem:   Fracture of femoral neck, right, closed Active Problems:   Paroxysmal atrial fibrillation   Hypertension   Arteriosclerotic cardiovascular disease (ASCVD)   Dementia   Closed right hip fracture     Medication List         acetaminophen 500 MG tablet  Commonly known as:  TYLENOL  Take 500 mg by mouth every 6 (six) hours as needed. For pain     amLODipine-benazepril 5-20 MG per capsule  Commonly known as:  LOTREL  Take 1 capsule by mouth daily.     aspirin EC 81 MG tablet  Take 81 mg by mouth daily.     clopidogrel 75 MG tablet  Commonly known as:  PLAVIX  Take 75 mg by mouth daily.     DAILY VITE Tabs  Take 1 tablet by mouth daily.     divalproex 125 MG DR tablet  Commonly known as:  DEPAKOTE  Take 125 mg by mouth at bedtime.     enoxaparin 40 MG/0.4ML injection  Commonly known as:  LOVENOX  Inject 0.4 mLs (40 mg total) into the skin daily.     feeding supplement (PRO-STAT SUGAR FREE 64) Liqd  Take 30 mLs by mouth 2 (two) times daily.     furosemide 20 MG tablet  Commonly known as:  LASIX  Take 20 mg by mouth daily.     HYDROcodone-acetaminophen 5-325 MG per tablet  Commonly known as:  NORCO/VICODIN  Take 1 tablet by mouth 2 (two) times daily.     loperamide 2 MG capsule  Commonly known as:  IMODIUM  Take 2 mg by mouth daily as needed. For Diarrhea     megestrol 625 MG/5ML suspension  Commonly known as:  MEGACE ES  Take 1,350 mg by mouth daily.     metoprolol succinate 50 MG 24 hr tablet  Commonly known as:  TOPROL-XL  Take 50 mg by mouth every morning. Take with or immediately following a meal.     nitroGLYCERIN 0.4 MG SL tablet  Commonly known as:  NITROSTAT  Place 0.4 mg under the tongue every 5 (five)  minutes x 3 doses as needed.     ondansetron 4 MG tablet  Commonly known as:  ZOFRAN  Take 4 mg by mouth every 6 (six) hours as needed. For nausea/vomiting     OYSTER SHELL CALCIUM + D 500-400 MG-UNIT Tabs  Generic drug:  Calcium Carb-Cholecalciferol  Take 1 tablet by mouth 2 (two) times daily.     pantoprazole 40 MG tablet  Commonly known as:  PROTONIX  Take 40 mg by mouth daily.     potassium chloride SA 20 MEQ tablet  Commonly known as:  K-DUR,KLOR-CON  Take 20 mEq by mouth daily.     senna 8.6 MG Tabs tablet  Commonly known as:  SENOKOT  Take 2 tablets by mouth daily. May take 2 extra tablets if needed.     Vitamin D 400 UNITS capsule  Take 400 Units by mouth daily.     ZINC-220 220 MG capsule  Generic drug:  zinc sulfate  Take 220 mg by mouth daily.        Discharged Condition: improved    Consults: orthopedics  Significant Diagnostic Studies: Dg Chest 1 View  02/20/2014   CLINICAL DATA:  Fall with right hip pain.  EXAM: CHEST - 1 VIEW  COMPARISON:  05/11/2012  FINDINGS: Chronic mild cardiomegaly. Coronary stenting. Vascular fullness of the hila is stable from priors. 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 No evidence of osseous erosion. No evidence of edema, consolidation, effusion, or pneumothorax. No acute osseous findings.  IMPRESSION: No active disease.   Electronically Signed   By: Jorje Guild M.D.   On: 02/20/2014 00:46   Dg Hip Complete Right  02/20/2014   CLINICAL DATA:  Fall with hip pain  EXAM: RIGHT HIP - COMPLETE 2+ VIEW  COMPARISON:  None.  FINDINGS: Acute right femoral neck fracture, transcervical. There is displacement with varus angulation. The femoral head remains located.  Bipolar left hip hemiarthroplasty. No acute adverse feature of the imaged portions. Non bridging heterotopic ossification noted lateral to the  left hip. No evidence of pelvic ring fracture or diastasis.  IMPRESSION: Displaced right femoral neck fracture.   Electronically Signed   By: Jorje Guild M.D.   On: 02/20/2014 00:43   Ct Head Wo Contrast  02/20/2014   CLINICAL DATA:  Fall.  EXAM: CT HEAD WITHOUT CONTRAST  CT CERVICAL SPINE WITHOUT CONTRAST  TECHNIQUE: Multidetector CT imaging of the head and cervical spine was performed following the standard protocol without intravenous contrast. Multiplanar CT image reconstructions of the cervical spine were also generated.  COMPARISON:  CT of the head and cervical spine January 10, 2014.  FINDINGS: CT HEAD FINDINGS  The ventricles and sulci are normal for age. No intraparenchymal hemorrhage, mass effect nor midline shift. Confluent supratentorial white matter hypodensities are similar. Remote left basal ganglia lacunar infarct. . No acute large vascular territory infarcts.  No abnormal extra-axial fluid collections. Basal cisterns are patent. Moderate calcific atherosclerosis of the carotid siphons.  No skull fracture. Resolution of left parietal scalp hematoma. Visualized paranasal sinuses and mastoid aircells are well-aerated. The included ocular globes and orbital contents are non-suspicious. Status post bilateral ocular lens implants. Moderate left temporomandibular osteoarthrosis.  CT CERVICAL SPINE FINDINGS  No acute cervical vertebral body fracture. Minimal similar at C3-4 grade 1 anterolisthesis. Moderate to severe degenerative disc disease C3-4 thru C6-7. The right C3-4 facets are fused on degenerative basis. C1-2 articulation maintained with moderate to severe arthropathy. Bone mineral density is decreased without destructive bony lesions. Elongated right stylomastoid process. Severe calcific atherosclerosis of the carotid bifurcations, if clinical concern for hemodynamically significant stenosis carotid ultrasound or CT angiogram of the neck could be considered. 11 mm right thyroid nodule would  be better characterized on thyroid sonogram clinically indicated. Patchy consolidation in right lung apex is slightly decreased.  Degenerative disc disease and facet arthropathy results in severe canal stenosis at C5-6. Severe right C4-5 and moderate to severe left C4-5 neural foraminal narrowing. Severe bilateral C5-6 and left C6-7 neural foraminal narrowing.  IMPRESSION: CT head:  No acute intracranial process.  Stable appearance of the head: Involutional changes. Severe white matter changes suggest chronic small vessel ischemic disease. Remote left basal ganglia lacunar infarct.  CT cervical spine: No acute fracture. Minimal grade 1 C3-4 anterolisthesis on degenerative basis.  Patchy consolidation right lung apex is slightly decreased.   Electronically Signed   By: Elon Alas   On: 02/20/2014 01:17   Ct Cervical Spine Wo Contrast  02/20/2014   CLINICAL DATA:  Fall.  EXAM: CT HEAD WITHOUT CONTRAST  CT CERVICAL SPINE WITHOUT  CONTRAST  TECHNIQUE: Multidetector CT imaging of the head and cervical spine was performed following the standard protocol without intravenous contrast. Multiplanar CT image reconstructions of the cervical spine were also generated.  COMPARISON:  CT of the head and cervical spine January 10, 2014.  FINDINGS: CT HEAD FINDINGS  The ventricles and sulci are normal for age. No intraparenchymal hemorrhage, mass effect nor midline shift. Confluent supratentorial white matter hypodensities are similar. Remote left basal ganglia lacunar infarct. . No acute large vascular territory infarcts.  No abnormal extra-axial fluid collections. Basal cisterns are patent. Moderate calcific atherosclerosis of the carotid siphons.  No skull fracture. Resolution of left parietal scalp hematoma. Visualized paranasal sinuses and mastoid aircells are well-aerated. The included ocular globes and orbital contents are non-suspicious. Status post bilateral ocular lens implants. Moderate left temporomandibular  osteoarthrosis.  CT CERVICAL SPINE FINDINGS  No acute cervical vertebral body fracture. Minimal similar at C3-4 grade 1 anterolisthesis. Moderate to severe degenerative disc disease C3-4 thru C6-7. The right C3-4 facets are fused on degenerative basis. C1-2 articulation maintained with moderate to severe arthropathy. Bone mineral density is decreased without destructive bony lesions. Elongated right stylomastoid process. Severe calcific atherosclerosis of the carotid bifurcations, if clinical concern for hemodynamically significant stenosis carotid ultrasound or CT angiogram of the neck could be considered. 11 mm right thyroid nodule would be better characterized on thyroid sonogram clinically indicated. Patchy consolidation in right lung apex is slightly decreased.  Degenerative disc disease and facet arthropathy results in severe canal stenosis at C5-6. Severe right C4-5 and moderate to severe left C4-5 neural foraminal narrowing. Severe bilateral C5-6 and left C6-7 neural foraminal narrowing.  IMPRESSION: CT head:  No acute intracranial process.  Stable appearance of the head: Involutional changes. Severe white matter changes suggest chronic small vessel ischemic disease. Remote left basal ganglia lacunar infarct.  CT cervical spine: No acute fracture. Minimal grade 1 C3-4 anterolisthesis on degenerative basis.  Patchy consolidation right lung apex is slightly decreased.   Electronically Signed   By: Elon Alas   On: 02/20/2014 01:17   Dg Pelvis Portable  02/24/2014   CLINICAL DATA:  Post right total hip arthroplasty  EXAM: PORTABLE PELVIS 1-2 VIEWS  COMPARISON:  Portable exam 0957 hr compared to 02/20/2014  FINDINGS: Interval right hip arthroplasty.  No acute fracture or dislocation.  Diffuse osseous demineralization.  Expected postsurgical soft tissue changes.  Left hip prosthesis stable.  IMPRESSION: Right hip prosthesis without acute complication.   Electronically Signed   By: Lavonia Dana M.D.   On:  02/24/2014 10:29    Lab Results: Basic Metabolic Panel:  Recent Labs  02/25/14 0554 02/26/14 0548  NA 139 143  K 4.3 4.2  CL 105 109  CO2 23 22  GLUCOSE 143* 92  BUN 18 20  CREATININE 0.83 0.78  CALCIUM 8.5 8.4   Liver Function Tests: No results found for this basename: AST, ALT, ALKPHOS, BILITOT, PROT, ALBUMIN,  in the last 72 hours   CBC:  Recent Labs  02/26/14 0548 02/27/14 0628  WBC 14.9* 13.5*  HGB 11.4* 11.5*  HCT 33.7* 34.9*  MCV 95.7 97.8  PLT 216 275    Recent Results (from the past 240 hour(s))  MRSA PCR SCREENING     Status: None   Collection Time    02/20/14  6:08 AM      Result Value Ref Range Status   MRSA by PCR NEGATIVE  NEGATIVE Final   Comment:  The GeneXpert MRSA Assay (FDA     approved for NASAL specimens     only), is one component of a     comprehensive MRSA colonization     surveillance program. It is not     intended to diagnose MRSA     infection nor to guide or     monitor treatment for     MRSA infections.  SURGICAL PCR SCREEN     Status: Abnormal   Collection Time    02/23/14  6:24 PM      Result Value Ref Range Status   MRSA, PCR NEGATIVE  NEGATIVE Final   Staphylococcus aureus POSITIVE (*) NEGATIVE Final   Comment:            The Xpert SA Assay (FDA     approved for NASAL specimens     in patients over 18 years of age),     is one component of     a comprehensive surveillance     program.  Test performance has     been validated by [employer] for patients greater     than or equal to 13 year old.     It is not intended     to diagnose infection nor to     guide or monitor treatment.     RESULT CALLED TO, READ BACK BY AND VERIFIED WITH:     L.COVINGTON AT 2245 ON 02/23/14 BY S.VANHOORNE     Hospital Course:  This is a 80 years old female who a resident of [place] who fell and sustained fracture of the rt hip. She was admitted and was evaluated by orthopedist. She had arthroplasty of the rt  hip. She stayed stable and discharged to nursing home for physical therapy and skilled nursing care.  Discharge Exam: Blood pressure 102/66, pulse 91, temperature 98.2 F (36.8 C), temperature source Oral, resp. rate 18, height 5' 2.99" (1.6 m), weight 49.805 kg (109 lb 12.8 oz), SpO2 97.00%.   Disposition:  Skilled nursing home      Signed: Rosita Fire   02/27/2014, 8:07 AM

## 2014-02-27 NOTE — Progress Notes (Signed)
02/27/14 0949 patient had three soft, formed stools yesterday, formed stool during night and this morning. Miralax, senokot, and colace held this morning. Donavan Foil, RN

## 2014-02-27 NOTE — Clinical Social Work Note (Signed)
Patient ready for discharge today, will transfer to [facility] staff via tunnel.  Family informed and agreeable.  No one from family can be here today, OK w patient being transitioned by SNF and [facility] staff.  Family  informed that SW at [facility] will assist w discharge planning when rehab has been completed.  FL2 reviewed w RN and updated as needed.  Discharge summary and OR notes faxed to facility.  Facility agreeable to transfer today.  Discharge packet prepared and placed w shadow chart for transfer.  CSW signing off as no further SW needs identified.    Edwyna Shell, LCSW Clinical Social Worker [phone])

## 2014-02-27 NOTE — Progress Notes (Signed)
02/27/14 1238 Patient discharging to [facility] today. IV site d/c'd per NT, within normal limits. Called report to [place], receiving nurse at [facility]. Pt resting, appears comfortable. Family aware of discharge plan, agreeable per CSW.  Awaiting discharge. Donavan Foil, RN

## 2014-02-27 NOTE — Plan of Care (Signed)
Problem: Phase III Progression Outcomes Goal: Ambulate BID with assist as able Outcome: Not Met (add Reason) 02/27/14 1303 Patient to continue physical therapy at [facility]. Donavan Foil, RN

## 2014-03-01 ENCOUNTER — Non-Acute Institutional Stay (SKILLED_NURSING_FACILITY): Payer: Medicare Other | Admitting: Internal Medicine

## 2014-03-01 ENCOUNTER — Encounter: Payer: Self-pay | Admitting: Internal Medicine

## 2014-03-01 DIAGNOSIS — I480 Paroxysmal atrial fibrillation: Secondary | ICD-10-CM

## 2014-03-01 DIAGNOSIS — S72009A Fracture of unspecified part of neck of unspecified femur, initial encounter for closed fracture: Secondary | ICD-10-CM

## 2014-03-01 DIAGNOSIS — I4891 Unspecified atrial fibrillation: Secondary | ICD-10-CM

## 2014-03-01 DIAGNOSIS — I10 Essential (primary) hypertension: Secondary | ICD-10-CM

## 2014-03-01 DIAGNOSIS — E870 Hyperosmolality and hypernatremia: Secondary | ICD-10-CM

## 2014-03-01 DIAGNOSIS — S72001A Fracture of unspecified part of neck of right femur, initial encounter for closed fracture: Secondary | ICD-10-CM

## 2014-03-01 NOTE — Progress Notes (Signed)
Patient ID: [name], female   DOB: 1917-05-23, 80 y.o.   MRN: [id number]   This is an acute visit.  Level care skilled.  Facility [facility].  Chief complaint-acute visit status post hospitalization for right hip fractur with subsequent repair.  History of present illness.  Patient is 80 year old female with a history of breast cancer atrial fibrillation hypertension coronary artery disease and dementia as well as GERD.  Also had a hip fracture on the left in 2011.  She was brought to the ED from an assisted-living center after she fell in the bathroom and was diagnosed with a right femoral neck fracture.  She had an arthroplasty of the right hip apparently will stop work was fairly unremarkable and she's been discharged here for physical therapy and skilled nursing care.  We have an updated labs on her sodium was 147 yesterday it is 148 today otherwise electrolytes appear to be within normal limits her BUN is 19 creatinine 0.78.  Apparently she is not eating much she actually is on Megace.  She is on Lasix low dose this was held this morning secondary to concerns about her somewhat elevated sodium.  Currently patient occasionally has somewhat diffuse complaints of pain although difficult to localize this.  Her vital signs appear to be stable.  Family medical social history as been reviewed per discharge note on 02/27/2014.  Medications have been reviewed per MAR.  Review of systems quite limited secondary to dementia.  Her main complaint is somewhat diffuse complaints of discomfort although again this is somewhat hard to localize does not really complain of abdominal pain is complaining at times of transitory headache and neck pain this appears to be more neck pain.  Does not complaining of any chest pain or shortness of breath.  Physical exam.  Temperature 98.2 pulse 84 respirations 20 blood pressure 128/68 her weight is 113.2 according to the scales it was 117.4  yesterday I suspect there is some scale variation here.  In general this is a frail elderly female in no distress lying in a [place] chair.  She is alert and responsive.  Her skin is warm and dry skin turgor does not appear to be grossly impaired.  Oral pharynx is clear mucous membranes appear slightly dry.  Chest is clear to auscultation no labored breathing.  Heart is regular rate and rhythm without murmur gallop or rub.  She does not have any significant lower extremity edema.  Abdomen is soft nontender with active bowel sounds slightly protuberant.  Muscle skeletal general frailty does move her extra upper extremities--lower extremities difficult to assess as patient is lying in [place] chair surgical site currently is covered with dry dressing on the righ  her neck does not show any deformity or acute tenderness to palpationts no acute type pain with passive range of motion of her extremities   Neurologic appears grossly intact cranial nerves intact I did not see any lateralizing findings limited exam since she is in a [place] chair he has a history of dementia does follow simple verbal commands.  Labs.  03/01/2014.  Sodium 148 potassium 4.2 BUN 19 creatinine 0.78.  February 28 2014.  WBC 10.0 hemoglobin 11.5 platelets 322.  Sodium 147 potassium 3.9 BUN 17 creatinine 0.63  On April 8 S. liver function tests were within normal limits except bilirubin of 0.2 of note her albumin was 3.6.  Assessment and plan  #1-history of right hip fracture with repair-at this point appears to be stable she is having  diffuse pain complaints --she is getting Norco 2 times a day routinely also has Tylenol for breakthrough pain at this point will continue to monitor we may have to increase the frequency of the Norco  She continues on Lovenox for anticoagulation--and also is on Os-Cal.  #2-atrial fibrillation this appears to be rate controlled she is on a beta blocker also on Plavix as well as aspirin  again she is on Lovenox for DVT prophylaxis status post hip repair.  #3 hypertension this appears to be  fairly stable on Lotrel also is on metoprolol-recent blood pressure manually 120/68 I see previous readings 143/73 I also see 171/82 although this appears to be somewhat of an anomaly at this point will continue to monitor-.  #4-question history of edema-she is on low-dose Lasix and potassium supplementation again this was held last night secondary to concerns about her elevated sodium-we'll continue to hold this and monitor for any increased edema or weight gain will order weights daily for now notify provider many gain greater than 3 pounds-.  #5-history of dementia currently her only medication is Depakote at night I suspect this is for behaviors although somewhat unclear at this time.--We'll update Depakote level Monday as well  Number 6-- apparently history of poor appetite she is on Megace I suspect this may be a challenge continue to monitor her weights and intake closely she is on a feeding supplement--fluids will have to be encouraged as well  #7  coronary artery disease-again she is on Plavix and aspirin as well as nitroglycerin when necessary-currently she is asymptomatic does not complaining of chest pain.  #8-hypernatremia this appears to be slowly rising apparently this afternoon she's been drinking better which is encouraging we'll write order to encourage fluids also update a basic metabolic panel on Monday morning for followup by Dr. Bonnita Levan will give her 1 L of half-normal saline at 60 cc an hour.  [id number]

## 2014-03-03 ENCOUNTER — Non-Acute Institutional Stay (SKILLED_NURSING_FACILITY): Payer: Medicare Other | Admitting: Internal Medicine

## 2014-03-03 DIAGNOSIS — I480 Paroxysmal atrial fibrillation: Secondary | ICD-10-CM

## 2014-03-03 DIAGNOSIS — I2510 Atherosclerotic heart disease of native coronary artery without angina pectoris: Secondary | ICD-10-CM

## 2014-03-03 DIAGNOSIS — S72009A Fracture of unspecified part of neck of unspecified femur, initial encounter for closed fracture: Secondary | ICD-10-CM

## 2014-03-03 DIAGNOSIS — I4891 Unspecified atrial fibrillation: Secondary | ICD-10-CM

## 2014-03-03 DIAGNOSIS — S72001A Fracture of unspecified part of neck of right femur, initial encounter for closed fracture: Secondary | ICD-10-CM

## 2014-03-04 ENCOUNTER — Non-Acute Institutional Stay (SKILLED_NURSING_FACILITY): Payer: Medicare Other | Admitting: Internal Medicine

## 2014-03-04 ENCOUNTER — Encounter: Payer: Self-pay | Admitting: Internal Medicine

## 2014-03-04 DIAGNOSIS — M129 Arthropathy, unspecified: Secondary | ICD-10-CM

## 2014-03-04 DIAGNOSIS — M1990 Unspecified osteoarthritis, unspecified site: Secondary | ICD-10-CM

## 2014-03-04 DIAGNOSIS — R630 Anorexia: Secondary | ICD-10-CM

## 2014-03-04 NOTE — Progress Notes (Signed)
Patient ID: [name], female   DOB: 11/07/1917, 80 y.o.   MRN: [id number]   This is an acute visit.  Level of care skilled.  Facility [facility].  Chief complaint.  Acute visit secondary to pain management.  History of present illness.  Patient is a 79 year old female with a history of a right femoral neck fracture that was surgically repaired she is here for rehabilitation.  She had been in the dementia unit of an assisted living facility.  Her stay here has been complicated somewhat with poor by mouth intake initially however apparently according to nursing this has improved fairly significantly after the first couple days.  She came in on Megace apparently there's been an issue about obtaining this in the facility-she has not been receiving a but nonetheless apparently her appetite is improving this will have to be monitored.  Another issue has been her pain she is complaining of some leg pain at times she is on Norco 5-325 mg twice a day routinely nursing staff was wondering if this could be increased.  Currently her vital signs are stable she is complaining of some bilateral leg discomfort at times including when I examined her.  Family medical social history as been reviewed her admission on 03/01/2014.  Medications have been reviewed per [facility] of note her Lasix and potassium are on hold secondary to an elevated sodium which appears to be normalizing she also received a liter of IV fluids over the weekend.  Review of systems Limited secondary to dementia she does not complaining of any shortness of breath or chest pain does complain of some leg pain at times.  Physical exam.  Temperature 97.6 pulse 87 respirations 18 blood pressure 113/70.  In general this is a somewhat frail elderly female in no distress currently resting comfortably in bed.  Her skin is warm and dry  Oropharynx is clear mucous membranes are fairly moist.  Chest is clear to auscultation with shallow  air entry no labored breathing.  Heart is regular rate and rhythm without murmur gallop or rub.--Do not really see much lower extremity edema  Abdomen soft slightly tender to palpation I suspect this is due to the invasive maneuver there are positive bowel sounds.  Muscle skeletal has general frailty - is able to move her upper extremities again right lower extremity limited secondary to the recent surgery she does have general frailty and with any attempts at flexion at the knee she does appear to have some discomfort although I do not see any increased confusion edema erythema I do see arthritic changes-I do not see any deformities    Neurologic appears to be grossly intact.  Labs  03/03/2014.  WBC 8.6 hemoglobin 12.2 platelets 379.  Sodium 142 potassium 3.9 BUN 13 creatinine 0.6.  03/01/2014.  Sodium 148 potassium 4.2 BUN 19 creatinine 0.78.  Assessment and plan.  #1-pain management patient does appear to have general frailty and pain with movement of her legs-we'll continue the routine Norco twice a day and add a dose every 6 hours when necessary and monitor.  #2-poor appetite apparently this is improving at this point we'll hold adding an appetite stimulant but will have to monitor her weights 2 times a week notify provider of any loss greater than 3 pounds hopefully this improved appetite  Continues--Will update a metabolic panel first laboratory date next week.  [id number].

## 2014-03-05 ENCOUNTER — Other Ambulatory Visit: Payer: Self-pay | Admitting: *Deleted

## 2014-03-05 MED ORDER — HYDROCODONE-ACETAMINOPHEN 5-325 MG PO TABS: ORAL_TABLET | ORAL | Status: DC

## 2014-03-05 NOTE — Telephone Encounter (Signed)
[facility] 
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
## 2014-03-07 ENCOUNTER — Non-Acute Institutional Stay (SKILLED_NURSING_FACILITY): Payer: Medicare Other | Admitting: Internal Medicine

## 2014-03-07 DIAGNOSIS — E870 Hyperosmolality and hypernatremia: Secondary | ICD-10-CM

## 2014-03-07 DIAGNOSIS — S72009A Fracture of unspecified part of neck of unspecified femur, initial encounter for closed fracture: Secondary | ICD-10-CM

## 2014-03-07 DIAGNOSIS — L0390 Cellulitis, unspecified: Secondary | ICD-10-CM

## 2014-03-07 DIAGNOSIS — L0291 Cutaneous abscess, unspecified: Secondary | ICD-10-CM

## 2014-03-07 DIAGNOSIS — S72001A Fracture of unspecified part of neck of right femur, initial encounter for closed fracture: Secondary | ICD-10-CM

## 2014-03-07 NOTE — Progress Notes (Signed)
Patient ID: [name], female   DOB: 1917/07/21, 80 y.o.   MRN: [id number]   This is an acute visit.  Level of care skilled.  Facility [facility].  Chief complaint-acute visit secondary to erythema at right hip surgical site.  History of present illness.  Patient is a 80 year old female who is here for rehabilitation after undergoing a right hip fracture repair-this is complicated with a history of fairly significant dementia.  Initially when she came there were concerns about her po intake however apparently this has significantly improved--initially her sodium was elevated but her most recent lab done on April 20 it was down to 142.  Most concerning issue today apparently is some erythema that developed at the surgical site on the right hip.  Her vital signs are stable she is afebrile.  Family medical social history as been reviewed per progress note on 03/03/2014.  Medications have been reviewed per MAR.  Review of systems quite limited secondary to dementia-pain had been an issue earlier in the week but apparently routine Norco has been beneficial.  Nursing staff has not noted any complaints of chest pain or shortness of breath.  Physical exam.  Temperature is 97.6 pulse 90 respirations 18 blood pressure 132/66.  In general this is a frail elderly female in no distress lying comfortably in bed she is confused.  Her skin is warm and dry.  I do note  proximal end of the surgical site on the right hip there is a small amount of erythema about 1 cm in diameter extending around the  proximal area as this does not extend around the entire site just the proximal aspect.  There is no drainage or bleeding or significant tenderness or firmness around the area-staples are in place wound is well approximated.  Chest is clear to auscultation with poor respiratory effort heart is regular rate and rhythm without murmur gallop or rub.  She does not have significant lower extremity  edema.  Labs.  03/03/2014.  Sodium 142 potassium 3.9 BUN 13 creatinine 0.6.  WBC 8.6 hemoglobin 12.2 platelets 379.  03/01/2014.  Sodium 148 potassium 4.2 BUN 19 creatinine 0.78.  Assessment and plan.   #1-cellulitis there is a slight infection starting at the surgical site Will start doxycycline 100 mg twice a day for 7 days and monitor site closely-- also notify orthopedic surgeon of this  #2-poor by mouth intake--with hypernatremia---apparently this is improving sodium has improved with  pushing fluids-updated metabolic panel is pending for Monday, April 27.  [id number]

## 2014-03-08 DIAGNOSIS — L0390 Cellulitis, unspecified: Secondary | ICD-10-CM | POA: Insufficient documentation

## 2014-03-08 DIAGNOSIS — E870 Hyperosmolality and hypernatremia: Secondary | ICD-10-CM | POA: Insufficient documentation

## 2014-03-10 ENCOUNTER — Telehealth: Payer: Self-pay | Admitting: Orthopedic Surgery

## 2014-03-10 NOTE — Telephone Encounter (Signed)
Staples out POD 14 Routing to Dr. Aline Brochure

## 2014-03-10 NOTE — Telephone Encounter (Signed)
Mavis at [facility] called about Morgan  Garcia (2017/08/06). Said the nurse has questions about the patient's discharge 1.  When is the follow up appointment with Dr. Aline Brochure?  2.  Needs to know the stop date on the Lovenox  3.  When to remove staples.  Looked like she was discharged by PCP.  Mavis asked if we will fax these answers to her at [facility], fax # [phone]

## 2014-03-11 NOTE — Telephone Encounter (Signed)
Informed Mavis of Dr. Ruthe Mannan reply

## 2014-03-11 NOTE — Telephone Encounter (Signed)
Need the hip cheat sheet from you   lovenox 28 days for hip fracture   Staples out pod 12-14  Post op appt 4 weeks post op xrays if hip OTIF or gamma nail   Bipolar xrays not needed

## 2014-03-22 ENCOUNTER — Encounter: Payer: Self-pay | Admitting: Internal Medicine

## 2014-03-22 ENCOUNTER — Non-Acute Institutional Stay (SKILLED_NURSING_FACILITY): Payer: Medicare Other | Admitting: Internal Medicine

## 2014-03-22 DIAGNOSIS — I679 Cerebrovascular disease, unspecified: Secondary | ICD-10-CM

## 2014-03-22 DIAGNOSIS — S72009A Fracture of unspecified part of neck of unspecified femur, initial encounter for closed fracture: Secondary | ICD-10-CM

## 2014-03-22 DIAGNOSIS — I480 Paroxysmal atrial fibrillation: Secondary | ICD-10-CM

## 2014-03-22 DIAGNOSIS — S72001A Fracture of unspecified part of neck of right femur, initial encounter for closed fracture: Secondary | ICD-10-CM

## 2014-03-22 DIAGNOSIS — L0291 Cutaneous abscess, unspecified: Secondary | ICD-10-CM

## 2014-03-22 DIAGNOSIS — I4891 Unspecified atrial fibrillation: Secondary | ICD-10-CM

## 2014-03-22 DIAGNOSIS — E870 Hyperosmolality and hypernatremia: Secondary | ICD-10-CM

## 2014-03-22 DIAGNOSIS — L0390 Cellulitis, unspecified: Secondary | ICD-10-CM

## 2014-03-22 NOTE — Progress Notes (Signed)
Patient ID: [name], female   DOB: Jan 18, 1917, 80 y.o.   MRN: [id number]   This is an acute visit.  Level of care skilled.  Facility [facility].  Chief complaint-acute visit secondary to question erythema left lower leg-followup hypernatremia failure to thrive.  History of present illness.  Patient is a 80 year old female who came here status post hospitalization for right hip fracture with repair.  She also has a history of atrial fibrillation coronary artery disease and dementia.  Nursing staff reports she is doing better with her appetite there had been a issue earlier in her stay  but apparently has improved significantly per chart review appears she is now eating the majority of her meals--it appears she's gained about 3 pounds over the past 10 days.  She had some mild hypernatremia as well most recent sodium on April 27 showed a sodium of 144 which is an improvement.  She is not complaining of any dysphagia shortness of breath or pain although she is a poor historian secondary to dementia.  Most acute issue today is apparently some erythema on her left lower leg this is around what appears to have been a skin tear nursing staff would like it assessed.  She is afebrile.  Family medical social history as been reviewed per progress note on 03/01/2014.  Medications have been reviewed per MAR.  Review of systems.  Limited secondary to dementia but nursing staff says she's been quite stable with no acute pain complaints she is eating and drinking better which is encouraging she is afebrile.--She was on Megace but this has been discontinued her appetite however continues to be stable  Physical exam.  Temp is 97.7 pulse 83 respirations 20 blood pressure 125/73 weight is 112 this apparently is a gain of about 3 pounds over the past couple weeks.  In general this is a somewhat frail elderly female in no distress sitting comfortably in her wheelchair.  Her skin is warm and dry  do noet left shin area there appears to be a small open area skin tear there is about a centimeter of erythema extending from this area apparently there has been some drainage at times as well-- not really acutely tender I do not note any odor--  Oral pharynx is clear mucous membranes moist.  Chest is clear to auscultation with poor respiratory effort no labored breathing.  Heart is regular rate and rhythm with a 4-6/9 systolic murmur -- she does not really have significant lower extremity edema.  Muscle skeletal does ambulate in a wheelchair moves her extremities x4 I did not note any deformities her strength appears to be grossly intact.  Neurologic no lateralizing findings her speech is clear but she has significant confusion and difficulty following verbal commands.  Labs.  Give her 34 2015.  Sodium 144 potassium 4.7 BUN 28 creatinine 0.91.  03/03/2014.  CBC 8.6 hemoglobin 12.2 platelets 379.  Assessment plan.  #1-left lower extremity cellulitis-Will treat with doxycycline 100 mg twice a day x7 days-also culture any drainage -monitor for any changes.  #2-history hypernatremia-this appeared to be secondary to I suspect poor by mouth intake which has improved-Will update her metabolic panel to ensure stability  #3-history of cerebrovascular disease-she is on aspirin and Plavix-she is finishing a course of Lovenox which is due to end on May 11 status post hip surgery.  #4 history of right hip repair-apparently she has been DC'd from skilled occupational therapy and PT secondary to patient deemed to have reached her full  potential-she is completing her Lovenox and is followed by orthopedics her pain appears to be controlled with the Norco--she is on calcium supplementation  5-atrial fibrillation this appears to be rate controlled with Metroprolol  she is on anticoagulation.  [id number]   .

## 2014-03-24 ENCOUNTER — Ambulatory Visit (INDEPENDENT_AMBULATORY_CARE_PROVIDER_SITE_OTHER): Payer: Self-pay | Admitting: Orthopedic Surgery

## 2014-03-24 VITALS — BP 103/54 | Ht 62.0 in | Wt 109.0 lb

## 2014-03-24 DIAGNOSIS — Z96649 Presence of unspecified artificial hip joint: Secondary | ICD-10-CM

## 2014-03-24 DIAGNOSIS — S72009A Fracture of unspecified part of neck of unspecified femur, initial encounter for closed fracture: Secondary | ICD-10-CM

## 2014-03-24 DIAGNOSIS — S72001A Fracture of unspecified part of neck of right femur, initial encounter for closed fracture: Secondary | ICD-10-CM | POA: Insufficient documentation

## 2014-03-24 NOTE — Progress Notes (Signed)
Patient ID: [name], female   DOB: Apr 26, 1917, 80 y.o.   MRN: [id number]  Encounter Diagnoses  Name Primary?  . Hip fracture, right Yes  . S/P hip replacement     Chief Complaint  Patient presents with  . Follow-up    Hospital follow up Right Arthroplasty Bipolar DOS 02/24/14    BP 103/54  Ht 5\' 2"  (1.575 m)  Wt 109 lb (49.442 kg)  BMI 19.93 kg/m2  This patient is at the [facility]. She complains of chronic pain from fibromyalgia. She has good range of motion in her hip, her hip wound healed very well. Her leg lengths are equal and flexion is normal  Recommend continued physical therapy followup in 2 months.

## 2014-03-26 ENCOUNTER — Other Ambulatory Visit: Payer: Self-pay | Admitting: *Deleted

## 2014-03-26 MED ORDER — HYDROCODONE-ACETAMINOPHEN 5-325 MG PO TABS: ORAL_TABLET | ORAL | Status: DC

## 2014-03-26 NOTE — Telephone Encounter (Signed)
[facility] 

## 2014-04-01 ENCOUNTER — Non-Acute Institutional Stay (SKILLED_NURSING_FACILITY): Payer: Medicare Other | Admitting: Internal Medicine

## 2014-04-01 DIAGNOSIS — L0390 Cellulitis, unspecified: Secondary | ICD-10-CM

## 2014-04-01 DIAGNOSIS — L0291 Cutaneous abscess, unspecified: Secondary | ICD-10-CM
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
## 2014-04-05 ENCOUNTER — Encounter: Payer: Self-pay | Admitting: Internal Medicine

## 2014-04-08 ENCOUNTER — Encounter: Payer: Self-pay | Admitting: Internal Medicine

## 2014-04-08 ENCOUNTER — Non-Acute Institutional Stay (SKILLED_NURSING_FACILITY): Payer: Medicare Other | Admitting: Internal Medicine

## 2014-04-08 DIAGNOSIS — L0291 Cutaneous abscess, unspecified: Secondary | ICD-10-CM

## 2014-04-08 DIAGNOSIS — L0390 Cellulitis, unspecified: Secondary | ICD-10-CM

## 2014-04-08 DIAGNOSIS — I4891 Unspecified atrial fibrillation: Secondary | ICD-10-CM

## 2014-04-08 DIAGNOSIS — R634 Abnormal weight loss: Secondary | ICD-10-CM

## 2014-04-08 DIAGNOSIS — S72009A Fracture of unspecified part of neck of unspecified femur, initial encounter for closed fracture: Secondary | ICD-10-CM

## 2014-04-08 DIAGNOSIS — I480 Paroxysmal atrial fibrillation: Secondary | ICD-10-CM

## 2014-04-08 DIAGNOSIS — I10 Essential (primary) hypertension: Secondary | ICD-10-CM

## 2014-04-08 DIAGNOSIS — S72001A Fracture of unspecified part of neck of right femur, initial encounter for closed fracture: Secondary | ICD-10-CM

## 2014-04-08 DIAGNOSIS — F0390 Unspecified dementia without behavioral disturbance: Secondary | ICD-10-CM

## 2014-04-08 DIAGNOSIS — E870 Hyperosmolality and hypernatremia: Secondary | ICD-10-CM
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
## 2014-06-02 ENCOUNTER — Ambulatory Visit (HOSPITAL_COMMUNITY): Payer: Medicare Other | Attending: Orthopedic Surgery

## 2014-06-02 ENCOUNTER — Ambulatory Visit (INDEPENDENT_AMBULATORY_CARE_PROVIDER_SITE_OTHER): Payer: Medicare Other | Admitting: Orthopedic Surgery

## 2014-06-02 VITALS — BP 109/82 | Ht 62.0 in | Wt 109.0 lb

## 2014-06-02 DIAGNOSIS — I2510 Atherosclerotic heart disease of native coronary artery without angina pectoris: Secondary | ICD-10-CM

## 2014-06-02 DIAGNOSIS — Z96649 Presence of unspecified artificial hip joint: Secondary | ICD-10-CM

## 2014-06-02 DIAGNOSIS — S72001A Fracture of unspecified part of neck of right femur, initial encounter for closed fracture: Secondary | ICD-10-CM

## 2014-06-02 DIAGNOSIS — M25559 Pain in unspecified hip: Secondary | ICD-10-CM | POA: Insufficient documentation

## 2014-06-02 DIAGNOSIS — S72009A Fracture of unspecified part of neck of unspecified femur, initial encounter for closed fracture: Secondary | ICD-10-CM

## 2014-06-02 MED ORDER — GABAPENTIN 100 MG PO CAPS: 100.0000 mg | ORAL_CAPSULE | Freq: Every day | ORAL | Status: DC

## 2014-06-02 NOTE — Patient Instructions (Signed)
WILL START NEW MED (GABAPENTIN) AT BEDTIME

## 2014-06-02 NOTE — Progress Notes (Signed)
Patient ID: [name], female   DOB: 12/04/1916, 80 y.o.   MRN: [id number]  Chief Complaint  Patient presents with  . Follow-up    2 month recheck post op right bipolar hip arthroplasty, DOS 02/26/14    History 80 year-old female fell fractured right hip had right bipolar hip arthroplasty approximately 3 months ago. She is bed to chair she is not ambulated. Primary complaint seems to be related to aching secondary to fibromyalgia. She is on Plavix which is a contraindication to NSAIDs. She's also on hydrocodone for pain relief  Her leg lengths equal. No peripheral edema.  Recommend gabapentin 100 mg at night which should accommodate for any sedation  Followup 3 months

## 2014-06-17 ENCOUNTER — Other Ambulatory Visit: Payer: Self-pay | Admitting: *Deleted

## 2014-06-17 MED ORDER — HYDROCODONE-ACETAMINOPHEN 5-325 MG PO TABS: ORAL_TABLET | ORAL | Status: DC

## 2014-06-17 NOTE — Telephone Encounter (Signed)
[facility] 

## 2014-06-25 ENCOUNTER — Non-Acute Institutional Stay (SKILLED_NURSING_FACILITY): Payer: Medicare Other | Admitting: Internal Medicine

## 2014-06-25 DIAGNOSIS — R630 Anorexia: Secondary | ICD-10-CM

## 2014-06-26 NOTE — Progress Notes (Signed)
Patient ID: [name], female   DOB: [birth year], 80 y.o.   MRN: [id number]           PROGRESS NOTE  DATE: 06/25/2014          FACILITY:  [facility]  LEVEL OF CARE: SNF (31)  Acute Visit  CHIEF COMPLAINT:  Manage appetite loss.    HISTORY OF PRESENT ILLNESS: I was requested by the staff to assess the patient regarding above problem(s):  Staff report that patient has a very poor appetite.  Patient is a poor historian.  Her weight on 06/24/2014 was 105.4.  On 06/18/2014, weight was 103.6.  On 06/13/2014, weight was 103.6.  On 05/26/2014, weight was 104.    PAST MEDICAL HISTORY : Reviewed.  No changes/see problem list  CURRENT MEDICATIONS: Reviewed per MAR/see medication list  REVIEW OF SYSTEMS:  Unobtainable due to dementia.      PHYSICAL EXAMINATION  VS: see VS section  GENERAL: no acute distress, normal body habitus NECK: supple, trachea midline, no neck masses, no thyroid tenderness, no thyromegaly RESPIRATORY: breathing is even & unlabored, BS CTAB CARDIAC: RRR, no murmur,no extra heart sounds, no edema GI: abdomen soft, normal BS, no masses, no tenderness, no hepatomegaly, no splenomegaly PSYCHIATRIC: the patient is alert, disoriented, depressed affect, mood appropriate         LABS/RADIOLOGY:  In 05/2014:  CBC normal.     In 03/2014:  Total protein 5.8, albumin 2.6, otherwise CMP normal.     ASSESSMENT/PLAN:  Appetite loss.  Patient's weights are stable.  Therefore, no need for an appetite stimulant at this point.  We will have Dietary follow and supplement.  Check TSH.     CPT CODE: 05110            Knolan Simien Y Aaniyah Strohm, [place] [phone]

## 2014-07-30 ENCOUNTER — Non-Acute Institutional Stay (SKILLED_NURSING_FACILITY): Payer: Medicare Other | Admitting: Internal Medicine

## 2014-07-30 ENCOUNTER — Encounter: Payer: Self-pay | Admitting: Internal Medicine

## 2014-07-30 DIAGNOSIS — I4891 Unspecified atrial fibrillation: Secondary | ICD-10-CM

## 2014-07-30 DIAGNOSIS — E870 Hyperosmolality and hypernatremia: Secondary | ICD-10-CM

## 2014-07-30 DIAGNOSIS — I480 Paroxysmal atrial fibrillation: Secondary | ICD-10-CM

## 2014-07-30 DIAGNOSIS — S72001S Fracture of unspecified part of neck of right femur, sequela: Secondary | ICD-10-CM

## 2014-07-30 DIAGNOSIS — S72009S Fracture of unspecified part of neck of unspecified femur, sequela: Secondary | ICD-10-CM

## 2014-07-30 DIAGNOSIS — F03918 Unspecified dementia, unspecified severity, with other behavioral disturbance: Secondary | ICD-10-CM

## 2014-07-30 DIAGNOSIS — F0391 Unspecified dementia with behavioral disturbance: Secondary | ICD-10-CM

## 2014-07-30 DIAGNOSIS — R630 Anorexia: Secondary | ICD-10-CM

## 2014-07-30 DIAGNOSIS — I10 Essential (primary) hypertension: Secondary | ICD-10-CM

## 2014-07-30 NOTE — Progress Notes (Signed)
Patient ID: [name], female   DOB: 01/08/1917, 80 y.o.   MRN: [id number]   This is an acute visit.  Level care skilled.  Facility [facility]  .  Chief complaint-medical management of right hip fracture atrial fibrillation hypertension dementia coronary artery disease hypernatremia  .  History of present illness.   Patient is 80 year old female with a history of breast cancer atrial fibrillation hypertension coronary artery disease and dementia as well as GERD.  Also had a hip fracture on the left in 2011.  She was brought to the ED from an assisted-living center after she fell in the bathroom and was diagnosed with a right femoral neck fracture.  She had an arthroplasty of the right hip apparently post op was fairly unremarkable and she was discharged here for physical therapy and skilled nursing care  Initially her stay was complicated with very poor by mouth intake and her sodium did rise into the high 140s-she did receive some IV fluids however apparently her appetite improved.  She was seen by our service last month --her weights  were evaluated and found them to be stable hovering  about 104 pounds-at that point appetite stimulant was deferred    also history of atrial fibrillation  continues on aspirin as well as Plavix as well as Toprol for rate control this appears to be stable.  At one point she had been on low dose Lasix secondary to some edema but this was discontinued secondary to her high sodium and poor by mouth intake take as this has been stable during her stay here.  In regards to dementia this appears to be fairly significant although she appears more alert and interactive certainly than when she first came she continues on Depakote in the morning and Seroquel each bedtime apparently for some behaviors--she is being followed by psychiatric services  Shet saw orthopedics and was prescribed low-doseNeurontin for pain apparently this is helping she continues on routine Norco 3  times a day as well.  .  .  Family medical social history as been reviewed per discharge note on 02/27/2014 .  Medications have been reviewed per MAR .  Review of systems--this is limited secondary to dementia this has been obtained from patient and nursing.  In general no fever or chills--nursing staff does report a spotty appetite.  Skin-  Respiratory no complaints of cough or shortness of breath.  Cardiac no chest pain minimal lower extremity edema.  GI does not complaining of any abdominal pain or dysphagia nausea or vomiting.  Muscle skeletal is not complaining of any joint pain apparently this is s improved.  Neurologic no complaints of headache or dizziness.  Psych history of dementia again this appears to be stable she appears to have adapted fairly well to the setting --psychiatric. services have recently made medication changes as noted above  Physical exam.    Temperature 97.7 pulse 80 respirations 18 blood pressure 120/64-134/79 in this range weight class no 1 0 2.4 this appears relatively stable appears a loss of about 3 pounds since August 11 .  In general this is a frail elderly female in no distress lying in bed.  She is alert and responsive.  Her skin is warm and dry     Oral pharynx is clear mucous membranes moist  Chest is clear to auscultation no labored breathing--somewhat poor respiratory effort.  Heart is regular rate and rhythm with slight systolic murmur .  She does have minimal lower extremity edema.  Abdomen  is soft nontender with active bowel sounds s.  Muscle skeletal general frailty does move her extremities at baseline appears to have recovered well from the hip surgery-I do not note any deformities --as a well-healed right hip surgical scar Neurologic appears grossly intact cranial nerves intact--she does follow simple verbal commands without difficulty she is pleasant and interactive although somewhat confused  .   Labs  07/14/2014.  FFM-3.846.  05/21/2014.  WBC 6.3-hemoglobin 12.3-platelets 262.  04/09/2014.  Sodium 143 potassium 4 BUN 17 creatinine 0.82.    03/24/2014.  Sodium 142 potassium 3.9 BUN 21 creatinine 0.87.  03/10/2014.  Sodium 144 potassium 4.7 BUN 28 creatinine 0.91  03/03/2014.  WBC 8.6 hemoglobin 12.2 platelets 379.  Sodium 142 potassium 3.9 BUN 13 creatinine 0.6.  Marland Kitchen  03/01/2014.  Sodium 148 potassium 4.2 BUN 19 creatinine 0.78.  February 28 2014.  WBC 10.0 hemoglobin 11.5 platelets 322.  Sodium 147 potassium 3.9 BUN 17 creatinine 0.63  On April 8 S. liver function tests were within normal limits except bilirubin of 0.2 of note her albumin was 3.6.   Assessment and plan   #1-history of right hip fracture with repair she appears to be doing well in this regard she has seen orthopedics her pain appears to be controlled this is complicated with some history of fibromyalgia-she is receiving Norco 5-325 mg 3 times a day routinely and every 4 hours when necessary this appears to help- Neurontin was recently added as well by orthopedics also is on Os-Cal--.   #2-atrial fibrillation this appears to be rate controlled she is on a beta blocker also on Plavix as well as aspirin  .  #3 hypertension this appears to be fairly stable on Lotrel also is on metoprolol  --.  #4-question history of edema-this appears to be fairly minimal she is not on Lasix  -.  #5-history of dementia--she was recently seen by psychiatric services she is now on Depakote in the morning and Seroquel at night-   Number 6-- apparently history of poor appetite-- she is on dietary supplement including mighty shakes-and Prostat  twice a day Will update a CMP-as well as a TSH-would like to see where we stand--- it looks her weight is comparable to what it was 3 weeks ago although there is some scale variation-appears to hovering the 102-105 area recently--again will see what the lab work tells [place] -- monitor  clinically continue to monitor her weights closely   #7 coronary artery disease-again she is on Plavix and aspirin as well as nitroglycerin when necessary-currently she is asymptomatic does not complaining of chest pain .  #8-hypernatremia--this has improved during her stay here -Will update her metabolic panel    [id number]

## 2014-08-05 ENCOUNTER — Other Ambulatory Visit: Payer: Self-pay | Admitting: *Deleted

## 2014-08-05 MED ORDER — HYDROCODONE-ACETAMINOPHEN 5-325 MG PO TABS: ORAL_TABLET | ORAL | Status: DC

## 2014-08-05 NOTE — Telephone Encounter (Signed)
[facility] 

## 2014-08-17 ENCOUNTER — Encounter (HOSPITAL_COMMUNITY): Payer: Self-pay | Admitting: Emergency Medicine

## 2014-08-17 ENCOUNTER — Emergency Department (HOSPITAL_COMMUNITY)
Admission: EM | Admit: 2014-08-17 | Discharge: 2014-08-18 | Disposition: A | Discharge status: Home / Self Care | Payer: Medicare Other | Source: Home / Self Care | Attending: Emergency Medicine | Admitting: Emergency Medicine

## 2014-08-17 ENCOUNTER — Emergency Department (HOSPITAL_COMMUNITY): Payer: Medicare Other

## 2014-08-17 ENCOUNTER — Non-Acute Institutional Stay (SKILLED_NURSING_FACILITY): Payer: Medicare Other | Admitting: Internal Medicine

## 2014-08-17 DIAGNOSIS — M109 Gout, unspecified: Secondary | ICD-10-CM | POA: Insufficient documentation

## 2014-08-17 DIAGNOSIS — Z87440 Personal history of urinary (tract) infections: Secondary | ICD-10-CM

## 2014-08-17 DIAGNOSIS — F419 Anxiety disorder, unspecified: Secondary | ICD-10-CM

## 2014-08-17 DIAGNOSIS — K219 Gastro-esophageal reflux disease without esophagitis: Secondary | ICD-10-CM

## 2014-08-17 DIAGNOSIS — I482 Chronic atrial fibrillation, unspecified: Secondary | ICD-10-CM

## 2014-08-17 DIAGNOSIS — I480 Paroxysmal atrial fibrillation: Secondary | ICD-10-CM

## 2014-08-17 DIAGNOSIS — F0390 Unspecified dementia without behavioral disturbance: Secondary | ICD-10-CM

## 2014-08-17 DIAGNOSIS — Z8709 Personal history of other diseases of the respiratory system: Secondary | ICD-10-CM | POA: Insufficient documentation

## 2014-08-17 DIAGNOSIS — Z7902 Long term (current) use of antithrombotics/antiplatelets: Secondary | ICD-10-CM | POA: Insufficient documentation

## 2014-08-17 DIAGNOSIS — I2510 Atherosclerotic heart disease of native coronary artery without angina pectoris: Secondary | ICD-10-CM

## 2014-08-17 DIAGNOSIS — Z853 Personal history of malignant neoplasm of breast: Secondary | ICD-10-CM | POA: Insufficient documentation

## 2014-08-17 DIAGNOSIS — R404 Transient alteration of awareness: Secondary | ICD-10-CM

## 2014-08-17 DIAGNOSIS — Z8781 Personal history of (healed) traumatic fracture: Secondary | ICD-10-CM | POA: Insufficient documentation

## 2014-08-17 DIAGNOSIS — Z79899 Other long term (current) drug therapy: Secondary | ICD-10-CM | POA: Insufficient documentation

## 2014-08-17 DIAGNOSIS — R4189 Other symptoms and signs involving cognitive functions and awareness: Secondary | ICD-10-CM

## 2014-08-17 DIAGNOSIS — Z880 Allergy status to penicillin: Secondary | ICD-10-CM

## 2014-08-17 DIAGNOSIS — A419 Sepsis, unspecified organism: Secondary | ICD-10-CM | POA: Diagnosis not present

## 2014-08-17 DIAGNOSIS — F329 Major depressive disorder, single episode, unspecified: Secondary | ICD-10-CM

## 2014-08-17 DIAGNOSIS — R509 Fever, unspecified: Secondary | ICD-10-CM | POA: Diagnosis not present

## 2014-08-17 LAB — URINALYSIS, ROUTINE W REFLEX MICROSCOPIC
BILIRUBIN URINE: NEGATIVE
Glucose, UA: NEGATIVE mg/dL
HGB URINE DIPSTICK: NEGATIVE
Ketones, ur: NEGATIVE mg/dL
Leukocytes, UA: NEGATIVE
Nitrite: NEGATIVE
Protein, ur: NEGATIVE mg/dL
Specific Gravity, Urine: 1.01 (ref 1.005–1.030)
UROBILINOGEN UA: 0.2 mg/dL (ref 0.0–1.0)
pH: 5.5 (ref 5.0–8.0)

## 2014-08-17 LAB — CBC WITH DIFFERENTIAL/PLATELET
Basophils Absolute: 0 10*3/uL (ref 0.0–0.1)
Basophils Relative: 0 % (ref 0–1)
EOS ABS: 0.2 10*3/uL (ref 0.0–0.7)
EOS PCT: 3 % (ref 0–5)
HEMATOCRIT: 36.6 % (ref 36.0–46.0)
Hemoglobin: 12.3 g/dL (ref 12.0–15.0)
Lymphocytes Relative: 44 % (ref 12–46)
Lymphs Abs: 2.6 10*3/uL (ref 0.7–4.0)
MCH: 31.1 pg (ref 26.0–34.0)
MCHC: 33.6 g/dL (ref 30.0–36.0)
MCV: 92.7 fL (ref 78.0–100.0)
MONO ABS: 0.7 10*3/uL (ref 0.1–1.0)
Monocytes Relative: 12 % (ref 3–12)
Neutro Abs: 2.4 10*3/uL (ref 1.7–7.7)
Neutrophils Relative %: 41 % — ABNORMAL LOW (ref 43–77)
PLATELETS: 207 10*3/uL (ref 150–400)
RBC: 3.95 MIL/uL (ref 3.87–5.11)
RDW: 13.6 % (ref 11.5–15.5)
WBC: 5.8 10*3/uL (ref 4.0–10.5)

## 2014-08-17 LAB — BASIC METABOLIC PANEL
Anion gap: 13 (ref 5–15)
BUN: 30 mg/dL — AB (ref 6–23)
CO2: 25 mEq/L (ref 19–32)
CREATININE: 0.94 mg/dL (ref 0.50–1.10)
Calcium: 9.6 mg/dL (ref 8.4–10.5)
Chloride: 103 mEq/L (ref 96–112)
GFR calc Af Amer: 58 mL/min — ABNORMAL LOW (ref >90)
GFR, EST NON AFRICAN AMERICAN: 50 mL/min — AB (ref >90)
Glucose, Bld: 90 mg/dL (ref 70–99)
Potassium: 4.5 mEq/L (ref 3.7–5.3)
Sodium: 141 mEq/L (ref 137–147)

## 2014-08-17 NOTE — ED Notes (Signed)
Per report from nurse at [facility] center.  Pt was up in wheelchair and went unresponsive.  Pt was pale and diaphoretic. B/p 69/36. Heart rate 68.  cbg 156.   Staff picked pt up and placed her on bed and she came around.  Per staff she is back to her baseline.

## 2014-08-17 NOTE — Progress Notes (Signed)
Patient ID: [name], female   DOB: [birth year], 80 y.o.   MRN: [id number]   This is an acute visit.  Total care skilled.  Facility [facility].  Chief complaint-acute visit secondary to unresponsive episode.  History of present illness.  The patient is a pleasant 80 year old female with a history of significant dementia as well as atrial fibrillation coronary artery disease and hip fracture.  Apparently this afternoon patient was found to be unresponsive sitting in her wheelchair to ask according to her nurse she appeared to not be breathing and had no pulse-however by the time I arrived in the room patient was responding although quite lethargic.  Her blood pressure was noted to be low at 69/36-after she was put in bed blood pressure I obtained manually was 103/68.  Her pulse was regular irregular.  Gradually. she became more alert and actually appeared to be nearing her baseline by the time EMS arrived.  Her CBG was 156 O2 saturation originally was read in the 70s--but  on recheck appeared to be in the 90s again there was quite a bit of variation-- .  Medications have been reviewed per [facility] she is on aspirin and Plavix as well as Toprol for rate control with history of atrial fibrillation.  Family medical social history and review of most recently progress note on 07/30/2014.  Review of systems-quite limited the patient does not appear to be in any distress she is not complaining of chest pain or abdominal discomfort there has been no nausea or vomiting noted.  Physical exam.  Temperature is 97.4 pulse 65 respirations 18 blood pressure initially taken 69/36-reevaluation with patient lying down was 103/68.  In general this is a frail elderly female initially was quite lethargic although she had a pulse  was breathing and did respond when I saw her-she increasingly became more her baseline more alert responsive actually followed  verbal commands and turned in bed for me before  EMS arrived--he currently does not appear to be in acute distress  Her skin is warm and dry.  Oropharynx is clear mucous membranes appear fairly moist.  Chest is clear to auscultation with somewhat poor respiratory effort.  Heart distant heart sounds somewhat I could hear was regular irregular-she does have a history of a soft systolic murmur but this was difficult to auscultate does not have significant lower extremity edema.  Abdomen is soft does not appear to be acutely tender there are positive bowel sounds.  Muscle skeletal-is able to move all extremities x4 does have lower extremity weakness but this is baseline-I do not see any acute changes from baseline.  Neurologic she is now alert responsive again had been unresponsive earlier.  Psych he is oriented to self is pleasant and largely cooperative-this again appears to be relatively her baseline.  Labs in 07/31/2014.  WBC 10.5 hemoglobin 12.8 platelets 243.  Sodium 140 potassium 4.2 BUN 33 creatinine 1.1 .  Liver function tests within normal limits except albumin of 3.0.  Assessment and plan.  #1-unresponsive episode-this apparently was quite significant per nursing on initial presentation-as noted above she slowly began become more responsive and appeared to be almost at her baseline by the time EMS arrived--t she was noted to have significant hypotensive blood pressure reading apparently initially-secondary to these acute changes will send her to the ER for emergent evaluation--  [id number]

## 2014-08-17 NOTE — ED Notes (Signed)
Altered Mental Status at [facility].

## 2014-08-17 NOTE — ED Notes (Signed)
Notifed by lab that CBC clotted, charge nurse in room to attempt another blood draw.

## 2014-08-17 NOTE — ED Provider Notes (Signed)
CSN: [id number]     Arrival date & time 08/17/14  1749 History  This chart was scribed for Fredia Sorrow, MD by Molli Posey, ED Scribe. This patient was seen in room APA11/APA11 and the patient's care was started 6:43 PM.    Chief Complaint  Patient presents with  . Altered Mental Status   LEVEL 5 CAVEAT - Dementia   The history is provided by a relative. No language interpreter was used.   HPI Comments: [name] is a 80 y.o. female presented by her son to the Emergency Department complaining of constant drowsiness with gradual onset over the past 5 days. Her son states that she was at her baseline 6 days ago when he last saw her. He reports a similar episode 1 year ago. Patient was in a wheelchair and was unresponsive PTA. Her son reports she hast a PMHx of dementia. He reports that she has a DNR.    PCP FANTA  Past Medical History  Diagnosis Date  . Carcinoma of breast 2008    Right mastectomy  . Paroxysmal atrial fibrillation   . Hypertension   . Anxiety   . Arteriosclerotic cardiovascular disease (ASCVD)     Multiple prior PCI, most recently in 2007  . Dementia   . Gastroesophageal reflux disease   . Gout   . Hip fracture, left 2011  . Acute gastroenteritis   . Interstitial pulmonary disease   . Bronchitis, mucopurulent recurrent   . Anxiety   . Depression   . UTI (lower urinary tract infection)    Past Surgical History  Procedure Laterality Date  . Orif hip fracture  2011  . Cataract extraction    . Mastectomy  2008    Right  . Hip arthroplasty Right 02/24/2014    Procedure: ARTHROPLASTY BIPOLAR HIP;  Surgeon: Carole Civil, MD;  Location: [facility];  Service: Orthopedics;  Laterality: Right;   Family History  Problem Relation Age of Onset  . CAD Mother   . CAD Father    History  Substance Use Topics  . Smoking status: Never Smoker   . Smokeless tobacco: Never Used  . Alcohol Use: No   OB History   Grav Para Term Preterm Abortions TAB  SAB Ect Mult Living                 Review of Systems  Constitutional: Negative for fever and chills.  HENT: Negative for rhinorrhea and sore throat.   Eyes: Negative for visual disturbance.  Respiratory: Negative for cough and shortness of breath.   Cardiovascular: Negative for chest pain and leg swelling.  Gastrointestinal: Negative for abdominal pain.  Genitourinary: Negative for dysuria.  Musculoskeletal: Negative for back pain and neck pain.  Skin: Negative for rash.  Neurological: Negative for headaches.  Hematological: Does not bruise/bleed easily.  Psychiatric/Behavioral: Negative for confusion.      Allergies  Codeine; Penicillins; and Sulfa antibiotics  Home Medications   Prior to Admission medications   Medication Sig Start Date End Date Taking? Authorizing Provider  acetaminophen (TYLENOL) 500 MG tablet Take 500 mg by mouth every 6 (six) hours as needed. For pain    Yes Historical Provider, MD  Amino Acids-Protein Hydrolys (FEEDING SUPPLEMENT, PRO-STAT SUGAR FREE 64,) LIQD Take 30 mLs by mouth 2 (two) times daily.   Yes Historical Provider, MD  aspirin EC 81 MG tablet Take 81 mg by mouth daily.     Yes Historical Provider, MD  Cholecalciferol (VITAMIN D) 400 UNITS  capsule Take 400 Units by mouth daily.   Yes Historical Provider, MD  clopidogrel (PLAVIX) 75 MG tablet Take 75 mg by mouth daily.   Yes Historical Provider, MD  divalproex (DEPAKOTE) 125 MG DR tablet Take 125 mg by mouth every morning.    Yes Historical Provider, MD  gabapentin (NEURONTIN) 100 MG capsule Take 1 capsule (100 mg total) by mouth at bedtime. 06/02/14  Yes Carole Civil, MD  HYDROcodone-acetaminophen (NORCO/VICODIN) 5-325 MG per tablet Take 1 tablet by mouth every 4 (four) hours as needed for moderate pain. Take one tablet by mouth three times daily at 6am, 2pm, and 10pm and every 4 hours as needed for pain 08/05/14  Yes Blanchie Serve, MD  HYDROcodone-acetaminophen (NORCO/VICODIN) 5-325 MG per  tablet Take 1 tablet by mouth 3 (three) times daily.   Yes Historical Provider, MD  loperamide (IMODIUM) 2 MG capsule Take 2 mg by mouth daily as needed. For Diarrhea   Yes Historical Provider, MD  metoprolol succinate (TOPROL-XL) 50 MG 24 hr tablet Take 50 mg by mouth every morning. Take with or immediately following a meal.   Yes Historical Provider, MD  Multiple Vitamin (DAILY VITE) TABS Take 1 tablet by mouth daily.   Yes Historical Provider, MD  QUEtiapine (SEROQUEL) 25 MG tablet Take 25 mg by mouth at bedtime.   Yes Historical Provider, MD  ranitidine (ZANTAC) 150 MG tablet Take 150 mg by mouth at bedtime.   Yes Historical Provider, MD  senna (SENOKOT) 8.6 MG TABS Take 2 tablets by mouth daily. May take 2 extra tablets if needed.   Yes Historical Provider, MD  zinc sulfate (ZINC-220) 220 MG capsule Take 220 mg by mouth daily.   Yes Historical Provider, MD  nitroGLYCERIN (NITROSTAT) 0.4 MG SL tablet Place 0.4 mg under the tongue every 5 (five) minutes x 3 doses as needed. 09/12/11   Thayer Headings, MD  ondansetron (ZOFRAN) 4 MG tablet Take 4 mg by mouth every 6 (six) hours as needed. For nausea/vomiting    Historical Provider, MD   BP 139/70  Pulse 73  Resp 13  SpO2 94% Physical Exam  Nursing note and vitals reviewed. Constitutional: She is oriented to person, place, and time. She appears well-developed and well-nourished.  HENT:  Head: Normocephalic and atraumatic.  Eyes:  Pupils were 3 mm  Cardiovascular: Normal rate, regular rhythm and normal heart sounds.   Pulmonary/Chest: Effort normal and breath sounds normal. No respiratory distress. She has no wheezes. She has no rales.  Abdominal: Bowel sounds are normal. She exhibits no distension. There is no tenderness.  Musculoskeletal: She exhibits no edema.  Neurological: She is alert and oriented to person, place, and time.  Skin: Skin is warm and dry.  Psychiatric: She has a normal mood and affect.    ED Course  Procedures   DIAGNOSTIC STUDIES: Oxygen Saturation is % on [place], adequate by my interpretation.    COORDINATION OF CARE: 6:50 PM Discussed treatment plan with pt at bedside and pt agreed to plan.   Labs Review Labs Reviewed  CBC WITH DIFFERENTIAL - Abnormal; Notable for the following:    Neutrophils Relative % 41 (*)    All other components within normal limits  BASIC METABOLIC PANEL - Abnormal; Notable for the following:    BUN 30 (*)    GFR calc non Af Amer 50 (*)    GFR calc Af Amer 58 (*)    All other components within normal limits  URINALYSIS, ROUTINE W  REFLEX MICROSCOPIC   Results for orders placed during the hospital encounter of 08/17/14  URINALYSIS, ROUTINE W REFLEX MICROSCOPIC      Result Value Ref Range   Color, Urine YELLOW  YELLOW   APPearance CLEAR  CLEAR   Specific Gravity, Urine 1.010  1.005 - 1.030   pH 5.5  5.0 - 8.0   Glucose, UA NEGATIVE  NEGATIVE mg/dL   Hgb urine dipstick NEGATIVE  NEGATIVE   Bilirubin Urine NEGATIVE  NEGATIVE   Ketones, ur NEGATIVE  NEGATIVE mg/dL   Protein, ur NEGATIVE  NEGATIVE mg/dL   Urobilinogen, UA 0.2  0.0 - 1.0 mg/dL   Nitrite NEGATIVE  NEGATIVE   Leukocytes, UA NEGATIVE  NEGATIVE  CBC WITH DIFFERENTIAL      Result Value Ref Range   WBC 5.8  4.0 - 10.5 K/uL   RBC 3.95  3.87 - 5.11 MIL/uL   Hemoglobin 12.3  12.0 - 15.0 g/dL   HCT 36.6  36.0 - 46.0 %   MCV 92.7  78.0 - 100.0 fL   MCH 31.1  26.0 - 34.0 pg   MCHC 33.6  30.0 - 36.0 g/dL   RDW 13.6  11.5 - 15.5 %   Platelets 207  150 - 400 K/uL   Neutrophils Relative % 41 (*) 43 - 77 %   Neutro Abs 2.4  1.7 - 7.7 K/uL   Lymphocytes Relative 44  12 - 46 %   Lymphs Abs 2.6  0.7 - 4.0 K/uL   Monocytes Relative 12  3 - 12 %   Monocytes Absolute 0.7  0.1 - 1.0 K/uL   Eosinophils Relative 3  0 - 5 %   Eosinophils Absolute 0.2  0.0 - 0.7 K/uL   Basophils Relative 0  0 - 1 %   Basophils Absolute 0.0  0.0 - 0.1 K/uL  BASIC METABOLIC PANEL      Result Value Ref Range   Sodium 141  137 - 147  mEq/L   Potassium 4.5  3.7 - 5.3 mEq/L   Chloride 103  96 - 112 mEq/L   CO2 25  19 - 32 mEq/L   Glucose, Bld 90  70 - 99 mg/dL   BUN 30 (*) 6 - 23 mg/dL   Creatinine, Ser 0.94  0.50 - 1.10 mg/dL   Calcium 9.6  8.4 - 10.5 mg/dL   GFR calc non Af Amer 50 (*) >90 mL/min   GFR calc Af Amer 58 (*) >90 mL/min   Anion gap 13  5 - 15     Imaging Review Dg Chest 1 View  08/17/2014   CLINICAL DATA:  Altered mental status.  Chest pain.  EXAM: CHEST - 1 VIEW  COMPARISON:  Single view of the chest 02/20/2014 and 01/20/2012.  FINDINGS: Right apical scarring is unchanged. Lungs are otherwise clear. Heart size is normal. No pneumothorax or pleural effusion. Marked degenerative change about the shoulders noted.  IMPRESSION: No acute finding.  Stable compared to prior exam.   Electronically Signed   By: Inge Rise M.D.   On: 08/17/2014 20:14   Ct Head Wo Contrast  08/17/2014   CLINICAL DATA:  Altered mental status. History of breast cancer. Dementia. Hypertension.  EXAM: CT HEAD WITHOUT CONTRAST  TECHNIQUE: Contiguous axial images were obtained from the base of the skull through the vertex without intravenous contrast.  COMPARISON:  01/10/2014 and 02/20/2014  FINDINGS: Examination demonstrates age related atrophic change and chronic ischemic microvascular disease. The ventricles and cisterns are  otherwise unremarkable. There is no mass, mass effect, shift of midline structures or acute hemorrhage. There is an old lacunar infarct over the left caudate nucleus. No evidence of acute infarction. Remaining bones and soft tissues are unremarkable.  IMPRESSION: No acute intracranial findings.  Chronic ischemic microvascular disease and age related atrophic change. 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   Electronically Signed   By: Marin Olp M.D.   On: 08/17/2014 20:30     EKG Interpretation None      MDM   Final diagnoses:  Dementia, without behavioral disturbance   Patient sent in from nursing facility  for altered bowel status. Patient is a patient and Center. Family stated they saw her when she arrived here that she was pretty much baseline. Patient also contrast office to sleep however she did recognize her son in the conversations were fairly normal. Family felt she was baseline her mental status. Workup shows no evidence of infection head CT shows no acute changes chest x-ray does not show pneumonia. No evidence urinary tract infection no significant electrolyte abnormalities no renal insufficiency no significant anemia. Patient is stable for discharge back to the pain Center.  I personally performed the services described in this documentation, which was scribed in my presence. The recorded information has been reviewed and is accurate.      Fredia Sorrow, MD 08/17/14 [phone]

## 2014-08-17 NOTE — Discharge Instructions (Signed)
Patient with extensive workup for the altered mental status but family felt that her mental status is baseline the patient's workup was negative. Patient stable for discharge back to [facility].

## 2014-08-18 ENCOUNTER — Non-Acute Institutional Stay (SKILLED_NURSING_FACILITY): Payer: Medicare Other | Admitting: Internal Medicine

## 2014-08-18 ENCOUNTER — Inpatient Hospital Stay
Admission: RE | Admit: 2014-08-18 | Discharge: 2014-08-20 | Disposition: A | Discharge status: Nursing Home (Includes ICF) | Payer: Medicare Other | Source: Ambulatory Visit | Attending: Internal Medicine | Admitting: Internal Medicine

## 2014-08-18 ENCOUNTER — Encounter: Payer: Self-pay | Admitting: Internal Medicine

## 2014-08-18 DIAGNOSIS — R55 Syncope and collapse: Secondary | ICD-10-CM

## 2014-08-18 LAB — GLUCOSE, CAPILLARY: Glucose-Capillary: 156 mg/dL — ABNORMAL HIGH (ref 70–99)

## 2014-08-18 NOTE — ED Notes (Signed)
Report called to [facility] center, discharge instructions reviewed with staff. Patient transported via [facility]cher [facility] with staff, all belongings with patient and staff.

## 2014-08-18 NOTE — Progress Notes (Signed)
This encounter was created in error - please disregard.

## 2014-08-18 NOTE — Progress Notes (Signed)
Patient ID: [name], female   DOB: May 14, 1917, 80 y.o.   MRN: [id number] Facility; [facility] Chief complaint reviewed post transferred to ER yesterday with altered LOC and slightly low O2 sats at 88% History; the patient was transported to the ER last night with apparently altered LOC/syncope. She had lab work which was essentially unrevealing [see below] it. Also had a CT scan of the head and a chest x-ray which were unrevealing. She is returned to the facility. She has a history of paroxysmal atrial fibrillation however there was no evidence that I am aware of that she was either brattier tachyarrhythmic. Patient is only able to say that she feels unwell and points to her mid sternal area. She has multi-infarct dementia I think which has progressed somewhat.  Physical exam O2 sat is 95% pulse 87 respirations 18 and unlabored she apparently is afebrile with a stable blood pressure according to the nurse Respiratory clear entry bilaterally Cardiac soft midsystolic systolic murmur which sounds benign Abdomen; no liver no spleen no tenderness GU no suprapubic or costovertebral angle tenderness is evident Extremities no evidence of a DVT      CLINICAL DATA:  Altered mental status.  Chest pain.   EXAM: CHEST - 1 VIEW   COMPARISON:  Single view of the chest 02/20/2014 and 01/20/2012.   FINDINGS: Right apical scarring is unchanged. Lungs are otherwise clear. Heart size is normal. No pneumothorax or pleural effusion. Marked degenerative change about the shoulders noted.   IMPRESSION: No acute finding.  Stable compared to prior exam.     Electronically Signed   By: Inge Rise M.D.   On: 08/17/2014 20:14       CLINICAL DATA:  Altered mental status. History of breast cancer. Dementia. Hypertension.   EXAM: CT HEAD WITHOUT CONTRAST   TECHNIQUE: Contiguous axial images were obtained from the base of the skull through the vertex without intravenous contrast.    COMPARISON:  01/10/2014 and 02/20/2014   FINDINGS: Examination demonstrates age related atrophic change and chronic ischemic microvascular disease. The ventricles and cisterns are otherwise unremarkable. There is no mass, mass effect, shift of midline structures or acute hemorrhage. There is an old lacunar infarct over the left caudate nucleus. No evidence of acute infarction. Remaining bones and soft tissues are unremarkable.   IMPRESSION: No acute intracranial findings.   Chronic ischemic microvascular disease and age related atrophic change. 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       Results for Morgan, Garcia (MRN [id number]) as of 08/18/2014 13:28  Ref. Range 08/17/2014 19:12 08/17/2014 20:05 08/17/2014 20:21 08/17/2014 20:36  Sodium Latest Range: 137-147 mEq/L 141     Potassium Latest Range: 3.7-5.3 mEq/L 4.5     Chloride Latest Range: 96-112 mEq/L 103     CO2 Latest Range: 19-32 mEq/L 25     BUN Latest Range: 6-23 mg/dL 30 (H)     Creatinine Latest Range: 0.50-1.10 mg/dL 0.94     Calcium Latest Range: 8.4-10.5 mg/dL 9.6     GFR calc non Af Amer Latest Range: >90 mL/min 50 (L)     GFR calc Af Amer Latest Range: >90 mL/min 58 (L)     Glucose Latest Range: 70-99 mg/dL 90     Anion gap Latest Range: 5-15  13     WBC Latest Range: 4.0-10.5 K/uL 5.8     RBC Latest Range: 3.87-5.11 MIL/uL 3.95     Hemoglobin Latest Range: 12.0-15.0 g/dL 12.3     HCT Latest  Range: 36.0-46.0 % 36.6     MCV Latest Range: 78.0-100.0 fL 92.7     MCH Latest Range: 26.0-34.0 pg 31.1     MCHC Latest Range: 30.0-36.0 g/dL 33.6     RDW Latest Range: 11.5-15.5 % 13.6     Platelets Latest Range: 150-400 K/uL 207     Neutrophils Relative % Latest Range: 43-77 % 41 (L)     Lymphocytes Relative Latest Range: 12-46 % 44     Monocytes Relative Latest Range: 3-12 % 12     Eosinophils Relative Latest Range: 0-5 % 3     Basophils Relative Latest Range: 0-1 % 0     NEUT# Latest Range: 1.7-7.7 K/uL 2.4      Lymphocytes Absolute Latest Range: 0.7-4.0 K/uL 2.6     Monocytes Absolute Latest Range: 0.1-1.0 K/uL 0.7     Eosinophils Absolute Latest Range: 0.0-0.7 K/uL 0.2     Basophils Absolute Latest Range: 0.0-0.1 K/uL 0.0     Color, Urine Latest Range: YELLOW     YELLOW  APPearance Latest Range: CLEAR     CLEAR  Specific Gravity, Urine Latest Range: 1.005-1.030     1.010  pH Latest Range: 5.0-8.0     5.5  Glucose Latest Range: NEGATIVE mg/dL    NEGATIVE  Bilirubin Urine Latest Range: NEGATIVE     NEGATIVE  Ketones, ur Latest Range: NEGATIVE mg/dL    NEGATIVE  Protein Latest Range: NEGATIVE mg/dL    NEGATIVE  Urobilinogen, UA Latest Range: 0.0-1.0 mg/dL    0.2  Nitrite Latest Range: NEGATIVE     NEGATIVE  Leukocytes, UA Latest Range: NEGATIVE     NEGATIVE  Hgb urine dipstick Latest Range: NEGATIVE     NEGATIVE  CT HEAD WO CONTRAST No range found   Rpt   DG CHEST 1 VIEW No range found  Rpt      Impression/plan #1 syncope/presyncopal; she was sent to the ER and came back without any new orders. I really don't have a sense of this. She does have a history of paroxysmal atrial fibrillation. I wonder about orthostatic hypotension. She did not have any form of cardiac evaluation I can't see an EKG. For now I'm and manage this with observation.

## 2014-08-19 ENCOUNTER — Inpatient Hospital Stay (HOSPITAL_COMMUNITY)
Admit: 2014-08-19 | Discharge: 2014-08-19 | Disposition: A | Discharge status: Home / Self Care | Payer: Medicare Other | Source: Ambulatory Visit | Attending: Internal Medicine | Admitting: Internal Medicine

## 2014-08-20 ENCOUNTER — Inpatient Hospital Stay (HOSPITAL_COMMUNITY): Payer: Medicare Other

## 2014-08-20 ENCOUNTER — Non-Acute Institutional Stay (SKILLED_NURSING_FACILITY): Payer: Medicare Other | Admitting: Internal Medicine

## 2014-08-20 ENCOUNTER — Encounter: Payer: Self-pay | Admitting: Internal Medicine

## 2014-08-20 ENCOUNTER — Emergency Department (HOSPITAL_COMMUNITY): Payer: Medicare Other

## 2014-08-20 ENCOUNTER — Encounter (HOSPITAL_COMMUNITY): Payer: Medicare Other

## 2014-08-20 ENCOUNTER — Inpatient Hospital Stay (HOSPITAL_COMMUNITY)
Admission: EM | Admit: 2014-08-20 | Discharge: 2014-09-14 | DRG: 871 | Disposition: E | Payer: Medicare Other | Attending: Internal Medicine | Admitting: Internal Medicine

## 2014-08-20 DIAGNOSIS — E785 Hyperlipidemia, unspecified: Secondary | ICD-10-CM | POA: Diagnosis present

## 2014-08-20 DIAGNOSIS — I2510 Atherosclerotic heart disease of native coronary artery without angina pectoris: Secondary | ICD-10-CM | POA: Diagnosis present

## 2014-08-20 DIAGNOSIS — R54 Age-related physical debility: Secondary | ICD-10-CM | POA: Diagnosis present

## 2014-08-20 DIAGNOSIS — R000 Tachycardia, unspecified: Secondary | ICD-10-CM

## 2014-08-20 DIAGNOSIS — Z96641 Presence of right artificial hip joint: Secondary | ICD-10-CM | POA: Diagnosis present

## 2014-08-20 DIAGNOSIS — M1990 Unspecified osteoarthritis, unspecified site: Secondary | ICD-10-CM | POA: Diagnosis present

## 2014-08-20 DIAGNOSIS — I480 Paroxysmal atrial fibrillation: Secondary | ICD-10-CM | POA: Diagnosis present

## 2014-08-20 DIAGNOSIS — R1084 Generalized abdominal pain: Secondary | ICD-10-CM

## 2014-08-20 DIAGNOSIS — Z79891 Long term (current) use of opiate analgesic: Secondary | ICD-10-CM | POA: Diagnosis not present

## 2014-08-20 DIAGNOSIS — K5900 Constipation, unspecified: Secondary | ICD-10-CM | POA: Diagnosis present

## 2014-08-20 DIAGNOSIS — R509 Fever, unspecified: Secondary | ICD-10-CM | POA: Diagnosis present

## 2014-08-20 DIAGNOSIS — I9589 Other hypotension: Secondary | ICD-10-CM

## 2014-08-20 DIAGNOSIS — F418 Other specified anxiety disorders: Secondary | ICD-10-CM | POA: Diagnosis present

## 2014-08-20 DIAGNOSIS — I10 Essential (primary) hypertension: Secondary | ICD-10-CM | POA: Diagnosis present

## 2014-08-20 DIAGNOSIS — J811 Chronic pulmonary edema: Secondary | ICD-10-CM | POA: Diagnosis present

## 2014-08-20 DIAGNOSIS — Z7982 Long term (current) use of aspirin: Secondary | ICD-10-CM

## 2014-08-20 DIAGNOSIS — R6521 Severe sepsis with septic shock: Secondary | ICD-10-CM

## 2014-08-20 DIAGNOSIS — Z7902 Long term (current) use of antithrombotics/antiplatelets: Secondary | ICD-10-CM | POA: Diagnosis not present

## 2014-08-20 DIAGNOSIS — J189 Pneumonia, unspecified organism: Secondary | ICD-10-CM | POA: Diagnosis present

## 2014-08-20 DIAGNOSIS — Z9010 Acquired absence of unspecified breast and nipple: Secondary | ICD-10-CM | POA: Diagnosis present

## 2014-08-20 DIAGNOSIS — Z8249 Family history of ischemic heart disease and other diseases of the circulatory system: Secondary | ICD-10-CM | POA: Diagnosis not present

## 2014-08-20 DIAGNOSIS — Z79899 Other long term (current) drug therapy: Secondary | ICD-10-CM | POA: Diagnosis not present

## 2014-08-20 DIAGNOSIS — E860 Dehydration: Secondary | ICD-10-CM

## 2014-08-20 DIAGNOSIS — I350 Nonrheumatic aortic (valve) stenosis: Secondary | ICD-10-CM | POA: Diagnosis present

## 2014-08-20 DIAGNOSIS — E46 Unspecified protein-calorie malnutrition: Secondary | ICD-10-CM

## 2014-08-20 DIAGNOSIS — A419 Sepsis, unspecified organism: Secondary | ICD-10-CM | POA: Diagnosis present

## 2014-08-20 DIAGNOSIS — I4891 Unspecified atrial fibrillation: Secondary | ICD-10-CM

## 2014-08-20 DIAGNOSIS — E440 Moderate protein-calorie malnutrition: Secondary | ICD-10-CM | POA: Diagnosis present

## 2014-08-20 DIAGNOSIS — Z515 Encounter for palliative care: Secondary | ICD-10-CM

## 2014-08-20 DIAGNOSIS — Z23 Encounter for immunization: Secondary | ICD-10-CM

## 2014-08-20 DIAGNOSIS — I25119 Atherosclerotic heart disease of native coronary artery with unspecified angina pectoris: Secondary | ICD-10-CM

## 2014-08-20 DIAGNOSIS — F329 Major depressive disorder, single episode, unspecified: Secondary | ICD-10-CM | POA: Diagnosis present

## 2014-08-20 DIAGNOSIS — F0391 Unspecified dementia with behavioral disturbance: Secondary | ICD-10-CM | POA: Diagnosis present

## 2014-08-20 DIAGNOSIS — Z9049 Acquired absence of other specified parts of digestive tract: Secondary | ICD-10-CM | POA: Diagnosis present

## 2014-08-20 DIAGNOSIS — R197 Diarrhea, unspecified: Secondary | ICD-10-CM | POA: Diagnosis present

## 2014-08-20 DIAGNOSIS — I471 Supraventricular tachycardia: Secondary | ICD-10-CM | POA: Diagnosis present

## 2014-08-20 DIAGNOSIS — K219 Gastro-esophageal reflux disease without esophagitis: Secondary | ICD-10-CM | POA: Diagnosis present

## 2014-08-20 DIAGNOSIS — N390 Urinary tract infection, site not specified: Secondary | ICD-10-CM | POA: Diagnosis present

## 2014-08-20 DIAGNOSIS — D72829 Elevated white blood cell count, unspecified: Secondary | ICD-10-CM | POA: Diagnosis present

## 2014-08-20 DIAGNOSIS — E861 Hypovolemia: Secondary | ICD-10-CM

## 2014-08-20 DIAGNOSIS — Z66 Do not resuscitate: Secondary | ICD-10-CM | POA: Diagnosis present

## 2014-08-20 DIAGNOSIS — Z853 Personal history of malignant neoplasm of breast: Secondary | ICD-10-CM | POA: Diagnosis not present

## 2014-08-20 DIAGNOSIS — R4189 Other symptoms and signs involving cognitive functions and awareness: Secondary | ICD-10-CM | POA: Insufficient documentation

## 2014-08-20 DIAGNOSIS — I679 Cerebrovascular disease, unspecified: Secondary | ICD-10-CM

## 2014-08-20 LAB — CBC WITH DIFFERENTIAL/PLATELET
BASOS ABS: 0 10*3/uL (ref 0.0–0.1)
Basophils Relative: 0 % (ref 0–1)
EOS PCT: 0 % (ref 0–5)
Eosinophils Absolute: 0 10*3/uL (ref 0.0–0.7)
HCT: 35 % — ABNORMAL LOW (ref 36.0–46.0)
Hemoglobin: 11.5 g/dL — ABNORMAL LOW (ref 12.0–15.0)
Lymphocytes Relative: 6 % — ABNORMAL LOW (ref 12–46)
Lymphs Abs: 1.7 10*3/uL (ref 0.7–4.0)
MCH: 31 pg (ref 26.0–34.0)
MCHC: 32.9 g/dL (ref 30.0–36.0)
MCV: 94.3 fL (ref 78.0–100.0)
MONO ABS: 2.5 10*3/uL — AB (ref 0.1–1.0)
Monocytes Relative: 9 % (ref 3–12)
NEUTROS PCT: 85 % — AB (ref 43–77)
Neutro Abs: 23.7 10*3/uL — ABNORMAL HIGH (ref 1.7–7.7)
PLATELETS: 201 10*3/uL (ref 150–400)
RBC: 3.71 MIL/uL — ABNORMAL LOW (ref 3.87–5.11)
RDW: 14.2 % (ref 11.5–15.5)
WBC: 27.9 10*3/uL — AB (ref 4.0–10.5)

## 2014-08-20 LAB — LACTIC ACID, PLASMA
LACTIC ACID, VENOUS: 0.6 mmol/L (ref 0.5–2.2)
Lactic Acid, Venous: 0.5 mmol/L (ref 0.5–2.2)

## 2014-08-20 LAB — COMPREHENSIVE METABOLIC PANEL
ALT: 11 U/L (ref 0–35)
AST: 15 U/L (ref 0–37)
Albumin: 3 g/dL — ABNORMAL LOW (ref 3.5–5.2)
Alkaline Phosphatase: 86 U/L (ref 39–117)
Anion gap: 11 (ref 5–15)
BUN: 37 mg/dL — ABNORMAL HIGH (ref 6–23)
CALCIUM: 8.7 mg/dL (ref 8.4–10.5)
CHLORIDE: 106 meq/L (ref 96–112)
CO2: 27 meq/L (ref 19–32)
CREATININE: 1.08 mg/dL (ref 0.50–1.10)
GFR calc non Af Amer: 43 mL/min — ABNORMAL LOW (ref >90)
GFR, EST AFRICAN AMERICAN: 49 mL/min — AB (ref >90)
GLUCOSE: 116 mg/dL — AB (ref 70–99)
Potassium: 3.7 mEq/L (ref 3.7–5.3)
Sodium: 144 mEq/L (ref 137–147)
Total Bilirubin: 0.5 mg/dL (ref 0.3–1.2)
Total Protein: 7.2 g/dL (ref 6.0–8.3)

## 2014-08-20 LAB — CLOSTRIDIUM DIFFICILE BY PCR: CDIFFPCR: NEGATIVE

## 2014-08-20 LAB — URINALYSIS, ROUTINE W REFLEX MICROSCOPIC
BILIRUBIN URINE: NEGATIVE
GLUCOSE, UA: NEGATIVE mg/dL
Hgb urine dipstick: NEGATIVE
Leukocytes, UA: NEGATIVE
Nitrite: POSITIVE — AB
PROTEIN: NEGATIVE mg/dL
Specific Gravity, Urine: 1.015 (ref 1.005–1.030)
UROBILINOGEN UA: 0.2 mg/dL (ref 0.0–1.0)
pH: 5.5 (ref 5.0–8.0)

## 2014-08-20 LAB — URINE MICROSCOPIC-ADD ON

## 2014-08-20 LAB — LIPASE, BLOOD: Lipase: 15 U/L (ref 11–59)

## 2014-08-20 LAB — PROTIME-INR
INR: 1.32 (ref 0.00–1.49)
PROTHROMBIN TIME: 16.4 s — AB (ref 11.6–15.2)

## 2014-08-20 LAB — MRSA PCR SCREENING: MRSA by PCR: POSITIVE — AB

## 2014-08-20 LAB — TROPONIN I: Troponin I: <0.3 ng/mL (ref <0.30)

## 2014-08-20 LAB — CORTISOL: CORTISOL PLASMA: 39.9 ug/dL

## 2014-08-20 LAB — APTT: aPTT: 41 seconds — ABNORMAL HIGH (ref 24–37)

## 2014-08-20 LAB — FIBRINOGEN: Fibrinogen: 428 mg/dL (ref 204–475)

## 2014-08-20 MED ORDER — DIVALPROEX SODIUM 125 MG PO DR TAB
125.0000 mg | DELAYED_RELEASE_TABLET | Freq: Every morning | ORAL | Status: DC
Start: 2014-08-20 — End: 2014-08-26
  Administered 2014-08-20 – 2014-08-24 (×4): 125 mg via ORAL
  Filled 2014-08-20 (×10): qty 1

## 2014-08-20 MED ORDER — QUETIAPINE FUMARATE 25 MG PO TABS
ORAL_TABLET | ORAL | Status: AC
  Filled 2014-08-20: qty 1

## 2014-08-20 MED ORDER — LEVOFLOXACIN IN D5W 750 MG/150ML IV SOLN
750.0000 mg | Freq: Once | INTRAVENOUS | Status: AC
  Administered 2014-08-20: 750 mg via INTRAVENOUS
  Filled 2014-08-20: qty 150

## 2014-08-20 MED ORDER — ACETAMINOPHEN 650 MG RE SUPP
650.0000 mg | Freq: Once | RECTAL | Status: AC
  Administered 2014-08-20: 650 mg via RECTAL
  Filled 2014-08-20: qty 1

## 2014-08-20 MED ORDER — SODIUM CHLORIDE 0.9 % IJ SOLN
10.0000 mL | INTRAMUSCULAR | Status: DC | PRN
  Administered 2014-08-20: 40 mL

## 2014-08-20 MED ORDER — DEXTROSE 5 % IV SOLN
1.0000 g | Freq: Three times a day (TID) | INTRAVENOUS | Status: DC
  Administered 2014-08-21 – 2014-08-25 (×15): 1 g via INTRAVENOUS
  Filled 2014-08-20 (×17): qty 1

## 2014-08-20 MED ORDER — LEVOFLOXACIN IN D5W 750 MG/150ML IV SOLN
750.0000 mg | INTRAVENOUS | Status: DC
  Administered 2014-08-22 – 2014-08-24 (×2): 750 mg via INTRAVENOUS
  Filled 2014-08-20 (×3): qty 150

## 2014-08-20 MED ORDER — HYDROCODONE-ACETAMINOPHEN 5-325 MG PO TABS
1.0000 | ORAL_TABLET | Freq: Three times a day (TID) | ORAL | Status: DC | PRN
  Administered 2014-08-22 – 2014-08-25 (×7): 1 via ORAL
  Filled 2014-08-20 (×7): qty 1

## 2014-08-20 MED ORDER — GABAPENTIN 100 MG PO CAPS
100.0000 mg | ORAL_CAPSULE | Freq: Every day | ORAL | Status: DC
  Administered 2014-08-20 – 2014-08-24 (×5): 100 mg via ORAL
  Filled 2014-08-20 (×6): qty 1

## 2014-08-20 MED ORDER — HEPARIN SODIUM (PORCINE) 5000 UNIT/ML IJ SOLN
5000.0000 [IU] | Freq: Three times a day (TID) | INTRAMUSCULAR | Status: DC
  Administered 2014-08-20 – 2014-08-25 (×15): 5000 [IU] via SUBCUTANEOUS
  Filled 2014-08-20 (×14): qty 1

## 2014-08-20 MED ORDER — LEVOFLOXACIN IN D5W 750 MG/150ML IV SOLN
INTRAVENOUS | Status: AC
  Filled 2014-08-20: qty 150

## 2014-08-20 MED ORDER — GABAPENTIN 100 MG PO CAPS
ORAL_CAPSULE | ORAL | Status: AC
  Filled 2014-08-20: qty 1

## 2014-08-20 MED ORDER — SODIUM CHLORIDE 0.9 % IJ SOLN
10.0000 mL | Freq: Two times a day (BID) | INTRAMUSCULAR | Status: DC
  Administered 2014-08-20: 10 mL
  Administered 2014-08-21: 40 mL
  Administered 2014-08-21 – 2014-08-22 (×2): 10 mL
  Administered 2014-08-22 – 2014-08-24 (×3): 40 mL
  Administered 2014-08-24: 10 mL
  Administered 2014-08-25: 20 mL
  Administered 2014-08-25: 10 mL

## 2014-08-20 MED ORDER — ONDANSETRON HCL 4 MG/2ML IJ SOLN
4.0000 mg | Freq: Four times a day (QID) | INTRAMUSCULAR | Status: DC | PRN
  Administered 2014-08-23 (×2): 4 mg via INTRAVENOUS
  Filled 2014-08-20 (×2): qty 2

## 2014-08-20 MED ORDER — ENSURE PUDDING PO PUDG
1.0000 | Freq: Three times a day (TID) | ORAL | Status: DC
  Administered 2014-08-20 – 2014-08-24 (×6): 1 via ORAL
  Filled 2014-08-20: qty 1

## 2014-08-20 MED ORDER — SODIUM CHLORIDE 0.9 % IV BOLUS (SEPSIS)
1000.0000 mL | Freq: Once | INTRAVENOUS | Status: AC
  Administered 2014-08-20: 1000 mL via INTRAVENOUS

## 2014-08-20 MED ORDER — ONDANSETRON HCL 4 MG PO TABS: 4.0000 mg | ORAL_TABLET | Freq: Four times a day (QID) | ORAL | Status: DC | PRN

## 2014-08-20 MED ORDER — CLOPIDOGREL BISULFATE 75 MG PO TABS: 75.0000 mg | ORAL_TABLET | Freq: Every day | ORAL | Status: DC

## 2014-08-20 MED ORDER — DEXTROSE 5 % IV SOLN
INTRAVENOUS | Status: AC
  Filled 2014-08-20: qty 2

## 2014-08-20 MED ORDER — DEXTROSE 5 % IV SOLN
1.0000 g | Freq: Once | INTRAVENOUS | Status: AC
  Administered 2014-08-20: 1 g via INTRAVENOUS
  Filled 2014-08-20: qty 10

## 2014-08-20 MED ORDER — SODIUM CHLORIDE 0.9 % IV SOLN
INTRAVENOUS | Status: DC
  Administered 2014-08-21: 10:00:00 via INTRAVENOUS

## 2014-08-20 MED ORDER — DEXTROSE 5 % IV SOLN
2.0000 g | Freq: Once | INTRAVENOUS | Status: AC
  Administered 2014-08-20: 2 g via INTRAVENOUS
  Filled 2014-08-20: qty 2

## 2014-08-20 MED ORDER — QUETIAPINE FUMARATE 25 MG PO TABS
25.0000 mg | ORAL_TABLET | Freq: Every day | ORAL | Status: DC
  Administered 2014-08-20 – 2014-08-24 (×5): 25 mg via ORAL
  Filled 2014-08-20 (×8): qty 1

## 2014-08-20 MED ORDER — SODIUM CHLORIDE 0.9 % IV SOLN: INTRAVENOUS | Status: AC

## 2014-08-20 NOTE — H&P (Signed)
[facility] History and Physical  [name] KWI:[id number] DOB: 1917/06/17 DOA: 09/13/2014  Referring physician: Dr Lacinda Axon PCP: Rosita Fire, MD  Specialists: none  Chief Complaint: Febrile illness  Assessment/Plan Active Problems: Sepsis UTI Dementia Depression/Anxiety Paroxysmal Afib  Sepsis: suspect urosepsis. UA from today w/ nitrites and many bacteria. UA from 2 days ago, nml. WBC 27.9 w/ L shift. BP prior to fluids 89/78. HR 101, Febrile to 100.7. Pt is DNR. 1L NS bolus. CXR w/o PNA. CTX x1 given in ED. Cdiff neg.  - admit to Step down - Routine care per son who is POA - nothing heroic - NS 135ml/hr.  - BCX pending - Lactic acid - Levaquin adn aztreonam per protocol - Consider CT ABD/PLV - Cortisol - UCX - NPO until passes swallow study - at high risk of aspiration  Dementia: Severe. [facility]. Pt baseline is either orientation to person and will carry on random conversations when engaged, or being non-communative. Suspect polypharmacy as contributing to periods of unresponsiveness. Son, [name] is very pleasant and is HPOA - number in chart  Polypharmacy: significant sedating medication regimen including norco, seroquel, neurontin, depakote. - slow decrease in medications that are not beneficial once clinical status improving from sepsis. This will likely require outpt changes over weeks.   HTN: hypotensive from septic shock.  - hold norvasc, metoprolol  Constipation/diarrhea: pt apparently goes between episodes of each.  - will treat diarrhea as necessary - senokot and miralax prn  Protein calorie malnutrition: Likely secondary to Dementia.  - Nutrition consult - ensure TID BM  Paroxismal Afib: no EKG ordered. RRR on exam. No anticoagulation at this time. Rate controlled on Metoprolol. Troponin x1 neg - EKG - Tele  CAD: last PCI in 2007 - Continue Plavix - may be worth discussing DC as outpt w/ son - see dementia and  polypharmacy  DVT Prophylaxis: Hep [place] TID  Code Status: DNR Family Communication: Son, [name] Disposition Plan: pending improvement  HPI: [name] is a 80 y.o. female came to [facility] ed 08/30/2014 with Fever to 101 and low blood pressure.  Episode of unresponsiveness on 10/5. Waxing and waning responsiveness since that time.  Baseline mentation is orientation to person only. Typically will sit up in a wheelchair and carry on a conversation about some random topics.   Review of Systems: Per HPI w/ all other systems negative.   Past Medical History  Diagnosis Date  . Carcinoma of breast 2008    Right mastectomy  . Paroxysmal atrial fibrillation   . Hypertension   . Anxiety   . Arteriosclerotic cardiovascular disease (ASCVD)     Multiple prior PCI, most recently in 2007  . Dementia   . Gastroesophageal reflux disease   . Gout   . Hip fracture, left 2011  . Acute gastroenteritis   . Interstitial pulmonary disease   . Bronchitis, mucopurulent recurrent   . Anxiety   . Depression   . UTI (lower urinary tract infection)    Past Surgical History  Procedure Laterality Date  . Orif hip fracture  2011  . Cataract extraction    . Mastectomy  2008    Right  . Hip arthroplasty Right 02/24/2014    Procedure: ARTHROPLASTY BIPOLAR HIP;  Surgeon: Carole Civil, MD;  Location: [facility];  Service: Orthopedics;  Laterality: Right;   Social History:  History   Social History Narrative   Resident of [employer]    Allergies  Allergen Reactions  .  Codeine Other (See Comments)    unknown  . Penicillins Other (See Comments)    unknown  . Sulfa Antibiotics Other (See Comments)    unknown    Family History  Problem Relation Age of Onset  . CAD Mother   . CAD Father     Prior to Admission medications   Medication Sig Start Date End Date Taking? Authorizing Provider  Amino Acids-Protein Hydrolys (FEEDING SUPPLEMENT, PRO-STAT SUGAR FREE 64,) LIQD Take 30 mLs by mouth 2  (two) times daily.   Yes Historical Provider, MD  amLODipine-benazepril (LOTREL) 5-20 MG per capsule Take 1 capsule by mouth daily.   Yes Historical Provider, MD  aspirin EC 81 MG tablet Take 81 mg by mouth daily.     Yes Historical Provider, MD  Cholecalciferol (VITAMIN D) 400 UNITS capsule Take 400 Units by mouth daily.   Yes Historical Provider, MD  clopidogrel (PLAVIX) 75 MG tablet Take 75 mg by mouth daily.   Yes Historical Provider, MD  divalproex (DEPAKOTE) 125 MG DR tablet Take 125 mg by mouth every morning.    Yes Historical Provider, MD  gabapentin (NEURONTIN) 100 MG capsule Take 1 capsule (100 mg total) by mouth at bedtime. 06/02/14  Yes Carole Civil, MD  HYDROcodone-acetaminophen (NORCO/VICODIN) 5-325 MG per tablet Take one tablet by mouth three times daily at 6am, 2pm, and 10pm and every 4 hours as needed for pain 08/05/14  Yes Blanchie Serve, MD  metoprolol succinate (TOPROL-XL) 50 MG 24 hr tablet Take 50 mg by mouth every morning. Take with or immediately following a meal.   Yes Historical Provider, MD  Multiple Vitamin (DAILY VITE) TABS Take 1 tablet by mouth daily.   Yes Historical Provider, MD  QUEtiapine (SEROQUEL) 25 MG tablet Take 25 mg by mouth at bedtime.   Yes Historical Provider, MD  ranitidine (ZANTAC) 150 MG tablet Take 150 mg by mouth at bedtime.   Yes Historical Provider, MD  zinc sulfate 220 MG capsule Take 220 mg by mouth daily.   Yes Historical Provider, MD  acetaminophen (TYLENOL) 500 MG tablet Take 500 mg by mouth every 6 (six) hours as needed. For pain     Historical Provider, MD  loperamide (IMODIUM) 2 MG capsule Take 2 mg by mouth daily as needed. For Diarrhea    Historical Provider, MD  nitroGLYCERIN (NITROSTAT) 0.4 MG SL tablet Place 0.4 mg under the tongue every 5 (five) minutes x 3 doses as needed for chest pain.  09/12/11   Thayer Headings, MD  ondansetron (ZOFRAN) 4 MG tablet Take 4 mg by mouth every 6 (six) hours as needed. For nausea/vomiting     Historical Provider, MD  senna (SENOKOT) 8.6 MG TABS Take 1 tablet by mouth daily as needed (constipation). May take 2 extra tablets if needed.    Historical Provider, MD   Physical Exam: Filed Vitals:   09/09/2014 1041 08/22/2014 1100 08/18/2014 1427 08/18/2014 1454  BP: 132/116 100/62 124/57 128/57  Pulse: 62 93 90 99  Temp:    100.7 F (38.2 C)  TempSrc:    Rectal  Resp: 17   18  SpO2: 96% 98% 100% 100%     General:  Frail and sleeping but arousable  Eyes: PERRL EOMI   ENT: dry mm,  Cardiovascular: RRR, II/VI systolic murmur  Respiratory: Nml WOB. No wheezes, ronchi. Good breath sounds throughout  Abdomen: NABS, nonttp  Skin: warm, well perfused, intact  Musculoskeletal: trace LE edema bilat, moves all extremities spontaneously  Psychiatric: drowsy, and noncommunicative   Neurologic: responds to commands. Moves all extremities.   Labs on Admission:  Basic Metabolic Panel:  Recent Labs Lab 08/17/14 1912 08/15/2014 1345  NA 141 144  K 4.5 3.7  CL 103 106  CO2 25 27  GLUCOSE 90 116*  BUN 30* 37*  CREATININE 0.94 1.08  CALCIUM 9.6 8.7   Liver Function Tests:  Recent Labs Lab 08/23/2014 1345  AST 15  ALT 11  ALKPHOS 86  BILITOT 0.5  PROT 7.2  ALBUMIN 3.0*    Recent Labs Lab 09/06/2014 1345  LIPASE 15   No results found for this basename: AMMONIA,  in the last 168 hours CBC:  Recent Labs Lab 08/17/14 1912 08/30/2014 1345  WBC 5.8 27.9*  NEUTROABS 2.4 23.7*  HGB 12.3 11.5*  HCT 36.6 35.0*  MCV 92.7 94.3  PLT 207 201   Cardiac Enzymes: No results found for this basename: CKTOTAL, CKMB, CKMBINDEX, TROPONINI,  in the last 168 hours  BNP (last 3 results) No results found for this basename: PROBNP,  in the last 8760 hours CBG:  Recent Labs Lab 08/17/14 1711  GLUCAP 156*    Radiological Exams on Admission: Dg Chest 1 View  08/21/2014   CLINICAL DATA:  Fever, dementia  EXAM: CHEST - 1 VIEW  COMPARISON:  Portable chest x-ray of 08/17/2014   FINDINGS: Opacity remains in the right upper lobe to the apex which appears to have been present previously and therefore may represent scarring from prior infection. No focal infiltrate or effusion is currently seen. Heart size is stable. Surgical clips are noted in the right upper quadrant from prior cholecystectomy. There are degenerative changes noted in both shoulders.  IMPRESSION: Stable probable scarring in the right upper lobe to the apex. No significant interval change.   Electronically Signed   By: Ivar Drape M.D.   On: 08/27/2014 08:51   Dg Chest Port 1 View  08/24/2014   CLINICAL DATA:  Fever.  Abnormal white cell count.  EXAM: PORTABLE CHEST - 1 VIEW  COMPARISON:  08/19/2014  FINDINGS: Normal heart size. There are decreased lung volumes. Coarsened interstitial markings noted bilaterally. There is mild interstitial edema. A small amount of fluid is identified along the heart zonal fissure of the right lung. Biapical scarring is again identified, right greater than left. 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  IMPRESSION: 1. No pneumonia. 2. Mild edema.   Electronically Signed   By: Kerby Moors M.D.   On: 08/14/2014 10:24       Time spent: >70 min in direct pt care and cordination.    MERRELL, Grayling Congress, MD [facility] [email or web address] Password [email or web address] 09/10/2014, 4:05 PM

## 2014-08-20 NOTE — ED Notes (Signed)
Pt brought over from [facility] center with hx of Abnormal labs- Elevated WC count

## 2014-08-20 NOTE — Progress Notes (Signed)
PICC Line Insertion Procedure Note  Procedure: Insertion of #35fr PICC  Indications:  Poor Access  Procedure Details  Informed consent was obtained for the procedure, including sedation.  Risks of lung perforation, hemorrhage, and adverse drug reaction were discussed.   Maximum sterile technique was used including antiseptics, cap, gloves, gown, hand hygiene, mask and sheet.  #26fr PICC inserted to the R Basilic vein per hospital protocol.   Blood return:  yes  Findings: Catheter inserted to 36 cm, with 2 cm. Exposed.   There were no changes to vital signs. Catheter was flushed with 30 cc NS. Patient did not tolerate procedure well. Pt is very confused and would pull arm away from me during procedure. Her hand was held by [place], [place] and words of encouragement given during the procedure.  Recommendations: CXR ordered to verify placement. PICC Brochure given to patient with teaching instruction.Peripherally Inserted Central Catheter/Midline Placement  The IV Nurse has discussed with the patient and/or persons authorized to consent for the patient, the purpose of this procedure and the potential benefits and risks involved with this procedure.  The benefits include less needle sticks, lab draws from the catheter and patient may be discharged home with the catheter.  Risks include, but not limited to, infection, bleeding, blood clot (thrombus formation), and puncture of an artery; nerve damage and irregular heat beat.  Alternatives to this procedure were also discussed.  PICC/Midline Placement Documentation  PICC Triple Lumen 50/56/97 PICC Right Basilic 2 cm 36 cm (Active)  Indication for Insertion or Continuance of Line Administration of hyperosmolar/irritating solutions (i.e. TPN, Vancomycin, etc.) 09/01/2014 12:32 PM  Exposed Catheter (cm) 3 cm 08/22/2014 12:32 PM  Site Assessment Clean;Dry;Intact 09/12/2014 12:32 PM  Lumen #1 Status Flushed;Saline locked;Capped (Central line);Blood return noted  09/11/2014 12:32 PM  Lumen #2 Status Flushed;Saline locked;Capped (Central line);Blood return noted 08/16/2014 12:32 PM  Lumen #3 Status Flushed;Saline locked;Capped (Central line);Blood return noted 09/07/2014 12:32 PM  Dressing Type Transparent;Securing device 08/21/2014 12:32 PM  Dressing Status Clean;Dry;Intact;Antimicrobial disc in place 08/23/2014 12:32 PM  Dressing Intervention New dressing 08/15/2014 12:32 PM  Dressing Change Due 08/27/14 08/22/2014 12:32 PM       Tymeer Vaquera, Essie Hart 09/03/2014, 12:51 PM

## 2014-08-20 NOTE — ED Notes (Signed)
Attempted to draw blood from picc line- quite a bit of resistance noted. Nurse that inserted Picc line in room. Will make necessary adjustement

## 2014-08-20 NOTE — ED Notes (Signed)
Pt here from [facility] for evaluation of elevated white count of 25.8

## 2014-08-20 NOTE — ED Notes (Signed)
Nurse in room at this time , attempting to site Picc Line. Consent was obtained by this nurse from pt's son, prior to attempting procedure

## 2014-08-20 NOTE — Progress Notes (Signed)
ANTIBIOTIC CONSULT NOTE - INITIAL  Pharmacy Consult for Aztreonam and Levaquin Indication: urosepsis  Allergies  Allergen Reactions  . Codeine Other (See Comments)    unknown  . Penicillins Other (See Comments)    unknown  . Sulfa Antibiotics Other (See Comments)    unknown    Patient Measurements:    Vital Signs: Temp: 100.1 F (37.8 C) (10/07 1645) Temp Source: Rectal (10/07 1645) BP: 103/47 mmHg (10/07 1630) Pulse Rate: 31 (10/07 1630) Intake/Output from previous day:   Intake/Output from this shift: Total I/O In: 40 [I.V.:40] Out: -   Labs:  Recent Labs  08/17/14 1912 09/07/2014 1345  WBC 5.8 27.9*  HGB 12.3 11.5*  PLT 207 201  CREATININE 0.94 1.08   The CrCl is unknown because both a height and weight (above a minimum accepted value) are required for this calculation. No results found for this basename: VANCOTROUGH, Corlis Leak, VANCORANDOM, [place], GENTPEAK, GENTRANDOM, TOBRATROUGH, TOBRAPEAK, TOBRARND, AMIKACINPEAK, AMIKACINTROU, AMIKACIN,  in the last 72 hours   Microbiology: Recent Results (from the past 720 hour(s))  CLOSTRIDIUM DIFFICILE BY PCR     Status: None   Collection Time    08/22/2014  9:55 AM      Result Value Ref Range Status   C difficile by pcr NEGATIVE  NEGATIVE Final    Medical History: Past Medical History  Diagnosis Date  . Carcinoma of breast 2008    Right mastectomy  . Paroxysmal atrial fibrillation   . Hypertension   . Anxiety   . Arteriosclerotic cardiovascular disease (ASCVD)     Multiple prior PCI, most recently in 2007  . Dementia   . Gastroesophageal reflux disease   . Gout   . Hip fracture, left 2011  . Acute gastroenteritis   . Interstitial pulmonary disease   . Bronchitis, mucopurulent recurrent   . Anxiety   . Depression   . UTI (lower urinary tract infection)    Anti-infectives   Start     Dose/Rate Route Frequency Ordered Stop   08/30/2014 1715  levofloxacin (LEVAQUIN) IVPB 750 mg     750 mg 100 mL/hr  over 90 Minutes Intravenous  Once 09/02/2014 1700     09/05/2014 1715  aztreonam (AZACTAM) 2 g in dextrose 5 % 50 mL IVPB     2 g 100 mL/hr over 30 Minutes Intravenous  Once 09/13/2014 1700     09/06/2014 1415  cefTRIAXone (ROCEPHIN) 1 g in dextrose 5 % 50 mL IVPB     1 g 100 mL/hr over 30 Minutes Intravenous  Once 09/13/2014 1414 08/19/2014 1603      Assessment: 80yo female admitted with suspected urosepsis.  Asked to initiate and manage Aztreonam and Levaquin.  Goal of Therapy:  Eradicate infection.  Plan:  Aztreonam 2gm IV now x 1 then 1gm IV q8hrs Levaquin 750mg  IV q48hrs Monitor labs, progress, and cultures  Hart Robinsons A 08/15/2014,5:15 PM

## 2014-08-20 NOTE — Progress Notes (Signed)
Patient ID: [name], female   DOB: 09-Sep-1917, 80 y.o.   MRN: [id number] facility; [facility] Chief complaint, leukocytosis, hypotension History; this is a patient with probable multi-infarct dementia. Her status has been a fairly abruptly deteriorating over the course of this year in terms of her mental status. She was in the emergency room early in the morning on 10/5 for a syncope/presyncope event. The cause of this was not clear. Her lab work was reasonably unremarkable other than a BUN of 30. Her white count was 5.8 hemoglobin 12.341% neutrophils. A CT scan of the head did not show anything acute. I reviewed her in the morning of October 5 the. Her exam was reasonably benign. Over last night she has deteriorated that. Followup lab work reveals a white count of 25.8 with 83% neutrophils. Her comprehensive metabolic panel is reasonably benign her BUN is 30 creatinine 1.01. Sodium potassium liver function tests are normal. Her albumin is slightly low at 3.3 a urinalysis is unremarkable.  This morning her blood pressures were noted to be 74/32. I am seeing this today acutely.  Review of systems; this is not possible secondary to the advanced state of her dementia/some degree of delirium GI; I have spoken to all the staff they sure me she is not having diarrhea  Physical examination; Gen. the patient is awake, talking loudly in a nonsensical fashion Vitals; temperature is 98.2 pulse 77 respirations 28 blood pressure is between 70 and 80 palpable, O2 sat is 95% on 2 L HEENT no lesions Respiratory; somewhat tachypneic but no crackles wheezes are heard Cardiac; 2/6 midsystolic murmur compatible with her known aortic sclerosis. She is dehydrated Abdomen; bowel sounds are positive. Not particularly distended. She is diffusely tender with some guarding GU; bladder is not overtly distended there is no costovertebral angle tenderness Skin; no hold for pressure sores or areas that would account for  this degree of leukocytosis  Impression/plan #1 shock probably septic plus or minus hypovolemic. She will need aggressive fluid resuscitation #2 sepsis the source of this is not clear. This is likely  of abdominal source. She is tender diffusely although bowel sounds are positive. She definitely needs the imaging studies probably a CT scan of the abdomen and chest. She had a negative chest x-ray 2 days ago had a reasonably benign-looking urinalysis. The staff assured  me she has not been having diarrhea  The staff of already discussed this with the patient's power of attorney is her son. He wishes to transfer her to the hospital for antibiotics fluids. Prognosis is very poor secondary to her frail status, mental status abnormalities.

## 2014-08-20 NOTE — ED Notes (Signed)
Lab attempted to draw blood , unsuccessful . IV sited but unable to draw blood with placement. Dr Lacinda Axon informed of inability to get blood drawn.

## 2014-08-20 NOTE — ED Provider Notes (Signed)
CSN: [id number]     Arrival date & time 09/07/2014  7062 History  This chart was scribed for Nat Christen, MD by Molli Posey, ED Scribe. This patient was seen in room [facility]14[facility]14 and the patient's care was started 9:54 AM.    Chief Complaint  Patient presents with  . Abnormal Lab    LEVEL 5 CAVEAT - DEMENTIA   HPI HPI Comments: Meyer Dockery Litaker is a 80 y.o. female who presents to the Emergency Department with an elevated white blood cell count [25K].  Primary care doctor reports an emergency room visit in the morning of 08/18/2014 with a syncope/presyncope event. At that visit, hemoglobin was 12.3. White count 5.8 with 41% neutrophils. Electrolytes normal. CT scan and chest x-ray were negative for acute findings.  Apparently, patient has had a deteriorating course since that time. White count recently was 25,000. Blood pressure was low. Patient was sent to the emergency department for evaluation of the above concerns. Patient has profound dementia and is unable to give [place] history.  DO NOT RESUSCITATE paperwork has been signed.   Past Medical History  Diagnosis Date  . Carcinoma of breast 2008    Right mastectomy  . Paroxysmal atrial fibrillation   . Hypertension   . Anxiety   . Arteriosclerotic cardiovascular disease (ASCVD)     Multiple prior PCI, most recently in 2007  . Dementia   . Gastroesophageal reflux disease   . Gout   . Hip fracture, left 2011  . Acute gastroenteritis   . Interstitial pulmonary disease   . Bronchitis, mucopurulent recurrent   . Anxiety   . Depression   . UTI (lower urinary tract infection)    Past Surgical History  Procedure Laterality Date  . Orif hip fracture  2011  . Cataract extraction    . Mastectomy  2008    Right  . Hip arthroplasty Right 02/24/2014    Procedure: ARTHROPLASTY BIPOLAR HIP;  Surgeon: Carole Civil, MD;  Location: [facility];  Service: Orthopedics;  Laterality: Right;   Family History  Problem Relation Age of Onset  .  CAD Mother   . CAD Father    History  Substance Use Topics  . Smoking status: Never Smoker   . Smokeless tobacco: Never Used  . Alcohol Use: No   OB History   Grav Para Term Preterm Abortions TAB SAB Ect Mult Living                 Review of Systems  Unable to perform ROS: Dementia      Allergies  Codeine; Penicillins; and Sulfa antibiotics  Home Medications   Prior to Admission medications   Medication Sig Start Date End Date Taking? Authorizing Provider  Amino Acids-Protein Hydrolys (FEEDING SUPPLEMENT, PRO-STAT SUGAR FREE 64,) LIQD Take 30 mLs by mouth 2 (two) times daily.   Yes Historical Provider, MD  amLODipine-benazepril (LOTREL) 5-20 MG per capsule Take 1 capsule by mouth daily.   Yes Historical Provider, MD  aspirin EC 81 MG tablet Take 81 mg by mouth daily.     Yes Historical Provider, MD  Cholecalciferol (VITAMIN D) 400 UNITS capsule Take 400 Units by mouth daily.   Yes Historical Provider, MD  clopidogrel (PLAVIX) 75 MG tablet Take 75 mg by mouth daily.   Yes Historical Provider, MD  divalproex (DEPAKOTE) 125 MG DR tablet Take 125 mg by mouth every morning.    Yes Historical Provider, MD  gabapentin (NEURONTIN) 100 MG capsule Take 1 capsule (100  mg total) by mouth at bedtime. 06/02/14  Yes Carole Civil, MD  HYDROcodone-acetaminophen (NORCO/VICODIN) 5-325 MG per tablet Take one tablet by mouth three times daily at 6am, 2pm, and 10pm and every 4 hours as needed for pain 08/05/14  Yes Blanchie Serve, MD  metoprolol succinate (TOPROL-XL) 50 MG 24 hr tablet Take 50 mg by mouth every morning. Take with or immediately following a meal.   Yes Historical Provider, MD  Multiple Vitamin (DAILY VITE) TABS Take 1 tablet by mouth daily.   Yes Historical Provider, MD  QUEtiapine (SEROQUEL) 25 MG tablet Take 25 mg by mouth at bedtime.   Yes Historical Provider, MD  ranitidine (ZANTAC) 150 MG tablet Take 150 mg by mouth at bedtime.   Yes Historical Provider, MD  zinc sulfate 220  MG capsule Take 220 mg by mouth daily.   Yes Historical Provider, MD  acetaminophen (TYLENOL) 500 MG tablet Take 500 mg by mouth every 6 (six) hours as needed. For pain     Historical Provider, MD  loperamide (IMODIUM) 2 MG capsule Take 2 mg by mouth daily as needed. For Diarrhea    Historical Provider, MD  nitroGLYCERIN (NITROSTAT) 0.4 MG SL tablet Place 0.4 mg under the tongue every 5 (five) minutes x 3 doses as needed for chest pain.  09/12/11   Thayer Headings, MD  ondansetron (ZOFRAN) 4 MG tablet Take 4 mg by mouth every 6 (six) hours as needed. For nausea/vomiting    Historical Provider, MD  senna (SENOKOT) 8.6 MG TABS Take 1 tablet by mouth daily as needed (constipation). May take 2 extra tablets if needed.    Historical Provider, MD   BP 128/57  Pulse 99  Temp(Src) 100.7 F (38.2 C) (Rectal)  Resp 18  SpO2 100% Physical Exam  Nursing note and vitals reviewed. Constitutional:  Demented,  thin  HENT:  Head: Normocephalic and atraumatic.  Eyes:  Unable  Neck: Normal range of motion. Neck supple.  Cardiovascular: Normal rate, regular rhythm and normal heart sounds.   Pulmonary/Chest: Effort normal and breath sounds normal.  Abdominal: Soft. Bowel sounds are normal.  Musculoskeletal:  Unable  Neurological:  Unable  Skin: Skin is warm and dry.  Psychiatric:  Demented    ED Course  Procedures  DIAGNOSTIC STUDIES: Oxygen Saturation is 100% on 2 L nasal cannula which is normal    COORDINATION OF CARE: 3:58 PM Discussed treatment plan with pt at bedside and pt agreed to plan.   Labs Review Labs Reviewed  COMPREHENSIVE METABOLIC PANEL - Abnormal; Notable for the following:    Glucose, Bld 116 (*)    BUN 37 (*)    Albumin 3.0 (*)    GFR calc non Af Amer 43 (*)    GFR calc Af Amer 49 (*)    All other components within normal limits  CBC WITH DIFFERENTIAL - Abnormal; Notable for the following:    WBC 27.9 (*)    RBC 3.71 (*)    Hemoglobin 11.5 (*)    HCT 35.0 (*)     Neutrophils Relative % 85 (*)    Lymphocytes Relative 6 (*)    Neutro Abs 23.7 (*)    Monocytes Absolute 2.5 (*)    All other components within normal limits  URINALYSIS, ROUTINE W REFLEX MICROSCOPIC - Abnormal; Notable for the following:    Ketones, ur TRACE (*)    Nitrite POSITIVE (*)    All other components within normal limits  URINE MICROSCOPIC-ADD ON - Abnormal;  Notable for the following:    Bacteria, UA MANY (*)    All other components within normal limits  CLOSTRIDIUM DIFFICILE BY PCR  CULTURE, BLOOD (ROUTINE X 2)  CULTURE, BLOOD (ROUTINE X 2)  URINE CULTURE  LIPASE, BLOOD  TROPONIN I    Imaging Review Dg Chest 1 View  08/29/2014   CLINICAL DATA:  Fever, dementia  EXAM: CHEST - 1 VIEW  COMPARISON:  Portable chest x-ray of 08/17/2014  FINDINGS: Opacity remains in the right upper lobe to the apex which appears to have been present previously and therefore may represent scarring from prior infection. No focal infiltrate or effusion is currently seen. Heart size is stable. Surgical clips are noted in the right upper quadrant from prior cholecystectomy. There are degenerative changes noted in both shoulders.  IMPRESSION: Stable probable scarring in the right upper lobe to the apex. No significant interval change.   Electronically Signed   By: Ivar Drape M.D.   On: 08/21/2014 08:51   Dg Chest Port 1 View  09/02/2014   CLINICAL DATA:  Fever.  Abnormal white cell count.  EXAM: PORTABLE CHEST - 1 VIEW  COMPARISON:  08/19/2014  FINDINGS: Normal heart size. There are decreased lung volumes. Coarsened interstitial markings noted bilaterally. There is mild interstitial edema. A small amount of fluid is identified along the heart zonal fissure of the right lung. Biapical scarring is again identified, right greater than left. 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  IMPRESSION: 1. No pneumonia. 2. Mild edema.   Electronically Signed   By: Kerby Moors M.D.   On: 09/08/2014  10:24     EKG Interpretation None      MDM   Final diagnoses:  Dementia, with behavioral disturbance  Leukocytosis   Patient is complex.  She has profound dementia and a white count > 25,000.  Urinalysis shows bacteria, nitrites, 3-6 white cells. Chest x-ray shows no pneumonia. Blood pressure and pulse have remained stable. IV hydration. IV Rocephin. Urine culture. Admit to telemetry. Discussed with Dr.Merrell   I personally performed the services described in this documentation, which was scribed in my presence. The recorded information has been reviewed and is accurate.      Nat Christen, MD 09/09/2014 [phone]

## 2014-08-21 ENCOUNTER — Encounter (HOSPITAL_COMMUNITY): Payer: Self-pay | Admitting: *Deleted

## 2014-08-21 DIAGNOSIS — I10 Essential (primary) hypertension: Secondary | ICD-10-CM

## 2014-08-21 DIAGNOSIS — I25119 Atherosclerotic heart disease of native coronary artery with unspecified angina pectoris: Secondary | ICD-10-CM

## 2014-08-21 DIAGNOSIS — I4891 Unspecified atrial fibrillation: Secondary | ICD-10-CM

## 2014-08-21 DIAGNOSIS — I471 Supraventricular tachycardia: Secondary | ICD-10-CM

## 2014-08-21 DIAGNOSIS — R000 Tachycardia, unspecified: Secondary | ICD-10-CM

## 2014-08-21 DIAGNOSIS — I350 Nonrheumatic aortic (valve) stenosis: Secondary | ICD-10-CM

## 2014-08-21 LAB — COMPREHENSIVE METABOLIC PANEL
ALT: 8 U/L (ref 0–35)
AST: 12 U/L (ref 0–37)
Albumin: 2.2 g/dL — ABNORMAL LOW (ref 3.5–5.2)
Alkaline Phosphatase: 75 U/L (ref 39–117)
Anion gap: 9 (ref 5–15)
BILIRUBIN TOTAL: 0.2 mg/dL — AB (ref 0.3–1.2)
BUN: 31 mg/dL — AB (ref 6–23)
CALCIUM: 7.6 mg/dL — AB (ref 8.4–10.5)
CHLORIDE: 112 meq/L (ref 96–112)
CO2: 25 meq/L (ref 19–32)
CREATININE: 0.89 mg/dL (ref 0.50–1.10)
GFR, EST AFRICAN AMERICAN: 62 mL/min — AB (ref >90)
GFR, EST NON AFRICAN AMERICAN: 54 mL/min — AB (ref >90)
Glucose, Bld: 96 mg/dL (ref 70–99)
Potassium: 3.6 mEq/L — ABNORMAL LOW (ref 3.7–5.3)
Sodium: 146 mEq/L (ref 137–147)
Total Protein: 5.5 g/dL — ABNORMAL LOW (ref 6.0–8.3)

## 2014-08-21 LAB — CBC
HCT: 28.3 % — ABNORMAL LOW (ref 36.0–46.0)
Hemoglobin: 9.2 g/dL — ABNORMAL LOW (ref 12.0–15.0)
MCH: 31.5 pg (ref 26.0–34.0)
MCHC: 32.5 g/dL (ref 30.0–36.0)
MCV: 96.9 fL (ref 78.0–100.0)
Platelets: 152 10*3/uL (ref 150–400)
RBC: 2.92 MIL/uL — ABNORMAL LOW (ref 3.87–5.11)
RDW: 14.6 % (ref 11.5–15.5)
WBC: 15.2 10*3/uL — AB (ref 4.0–10.5)

## 2014-08-21 MED ORDER — MUPIROCIN 2 % EX OINT
1.0000 "application " | TOPICAL_OINTMENT | Freq: Two times a day (BID) | CUTANEOUS | Status: AC
  Administered 2014-08-21 – 2014-08-25 (×10): 1 via NASAL
  Filled 2014-08-21: qty 22

## 2014-08-21 MED ORDER — CETYLPYRIDINIUM CHLORIDE 0.05 % MT LIQD
7.0000 mL | Freq: Two times a day (BID) | OROMUCOSAL | Status: DC
  Administered 2014-08-21 – 2014-08-25 (×9): 7 mL via OROMUCOSAL

## 2014-08-21 MED ORDER — INFLUENZA VAC SPLIT QUAD 0.5 ML IM SUSY
0.5000 mL | PREFILLED_SYRINGE | INTRAMUSCULAR | Status: AC
  Administered 2014-08-22: 0.5 mL via INTRAMUSCULAR
  Filled 2014-08-21: qty 0.5

## 2014-08-21 MED ORDER — PNEUMOCOCCAL VAC POLYVALENT 25 MCG/0.5ML IJ INJ
0.5000 mL | INJECTION | INTRAMUSCULAR | Status: AC
  Administered 2014-08-22: 0.5 mL via INTRAMUSCULAR
  Filled 2014-08-21 (×2): qty 0.5

## 2014-08-21 MED ORDER — METOPROLOL TARTRATE 1 MG/ML IV SOLN
5.0000 mg | Freq: Four times a day (QID) | INTRAVENOUS | Status: DC | PRN
  Administered 2014-08-21 – 2014-08-24 (×3): 5 mg via INTRAVENOUS
  Filled 2014-08-21 (×3): qty 5

## 2014-08-21 MED ORDER — ASPIRIN 81 MG PO CHEW
81.0000 mg | CHEWABLE_TABLET | Freq: Once | ORAL | Status: AC
  Administered 2014-08-22: 81 mg via ORAL
  Filled 2014-08-21: qty 1

## 2014-08-21 MED ORDER — CHLORHEXIDINE GLUCONATE CLOTH 2 % EX PADS
6.0000 | MEDICATED_PAD | Freq: Every day | CUTANEOUS | Status: DC
  Administered 2014-08-22 – 2014-08-25 (×4): 6 via TOPICAL

## 2014-08-21 MED ORDER — SODIUM CHLORIDE 0.9 % IV BOLUS (SEPSIS)
1000.0000 mL | INTRAVENOUS | Status: AC
  Administered 2014-08-21: 1000 mL via INTRAVENOUS

## 2014-08-21 MED ORDER — SODIUM CHLORIDE 0.9 % IV SOLN
INTRAVENOUS | Status: DC
  Administered 2014-08-22 – 2014-08-24 (×7): via INTRAVENOUS

## 2014-08-21 MED ORDER — AZTREONAM 1 G IJ SOLR
INTRAMUSCULAR | Status: AC
  Filled 2014-08-21: qty 1

## 2014-08-21 MED ORDER — DIGOXIN 0.25 MG/ML IJ SOLN
0.2500 mg | Freq: Four times a day (QID) | INTRAMUSCULAR | Status: DC | PRN
  Administered 2014-08-21 – 2014-08-23 (×3): 0.25 mg via INTRAVENOUS
  Filled 2014-08-21 (×3): qty 2

## 2014-08-21 MED ORDER — CHLORHEXIDINE GLUCONATE 0.12 % MT SOLN
15.0000 mL | Freq: Two times a day (BID) | OROMUCOSAL | Status: DC
  Administered 2014-08-21 – 2014-08-25 (×9): 15 mL via OROMUCOSAL
  Filled 2014-08-21 (×9): qty 15

## 2014-08-21 NOTE — Progress Notes (Signed)
Subjective: Patient was admitted from nursing home due to fever and leucocytosis. She has abnormal urinalysis/ She is being treated case sepsis. Patient is improving. Her fever has subsided and her leucocytosis is improving.  Objective: Vital signs in last 24 hours: Temp:  [97.4 F (36.3 C)-100.7 F (38.2 C)] 98.4 F (36.9 C) (10/08 0400) Pulse Rate:  [31-105] 31 (10/07 1900) Resp:  [16-28] 22 (10/07 1900) BP: (69-161)/(28-130) 96/52 mmHg (10/08 0500) SpO2:  [90 %-100 %] 90 % (10/08 0500) Weight:  [46.6 kg (102 lb 11.8 oz)-46.7 kg (102 lb 15.3 oz)] 46.7 kg (102 lb 15.3 oz) (10/08 0500) Weight change:     Intake/Output from previous day: 10/07 0701 - 10/08 0700 In: 1150 [I.V.:950; IV Piggyback:200] Out: -   PHYSICAL EXAM General appearance: fatigued and slowed mentation Resp: clear to auscultation bilaterally Cardio: S1, S2 normal GI: soft, non-tender; bowel sounds normal; no masses,  no organomegaly Extremities: extremities normal, atraumatic, no cyanosis or edema  Lab Results:  Results for orders placed during the hospital encounter of 08/19/2014 (from the past 48 hour(s))  CLOSTRIDIUM DIFFICILE BY PCR     Status: None   Collection Time    08/29/2014  9:55 AM      Result Value Ref Range   C difficile by pcr NEGATIVE  NEGATIVE  URINALYSIS, ROUTINE W REFLEX MICROSCOPIC     Status: Abnormal   Collection Time    08/28/2014  9:59 AM      Result Value Ref Range   Color, Urine YELLOW  YELLOW   APPearance CLEAR  CLEAR   Specific Gravity, Urine 1.015  1.005 - 1.030   pH 5.5  5.0 - 8.0   Glucose, UA NEGATIVE  NEGATIVE mg/dL   Hgb urine dipstick NEGATIVE  NEGATIVE   Bilirubin Urine NEGATIVE  NEGATIVE   Ketones, ur TRACE (*) NEGATIVE mg/dL   Protein, ur NEGATIVE  NEGATIVE mg/dL   Urobilinogen, UA 0.2  0.0 - 1.0 mg/dL   Nitrite POSITIVE (*) NEGATIVE   Leukocytes, UA NEGATIVE  NEGATIVE  URINE MICROSCOPIC-ADD ON     Status: Abnormal   Collection Time    08/21/2014  9:59 AM   Result Value Ref Range   WBC, UA 3-6  <3 WBC/hpf   RBC / HPF 3-6  <3 RBC/hpf   Bacteria, UA MANY (*) RARE  COMPREHENSIVE METABOLIC PANEL     Status: Abnormal   Collection Time    08/28/2014  1:45 PM      Result Value Ref Range   Sodium 144  137 - 147 mEq/L   Potassium 3.7  3.7 - 5.3 mEq/L   Chloride 106  96 - 112 mEq/L   CO2 27  19 - 32 mEq/L   Glucose, Bld 116 (*) 70 - 99 mg/dL   BUN 37 (*) 6 - 23 mg/dL   Creatinine, Ser 1.08  0.50 - 1.10 mg/dL   Calcium 8.7  8.4 - 10.5 mg/dL   Total Protein 7.2  6.0 - 8.3 g/dL   Albumin 3.0 (*) 3.5 - 5.2 g/dL   AST 15  0 - 37 U/L   ALT 11  0 - 35 U/L   Alkaline Phosphatase 86  39 - 117 U/L   Total Bilirubin 0.5  0.3 - 1.2 mg/dL   GFR calc non Af Amer 43 (*) >90 mL/min   GFR calc Af Amer 49 (*) >90 mL/min   Comment: (NOTE)     The eGFR has been calculated using the CKD EPI  equation.     This calculation has not been validated in all clinical situations.     eGFR's persistently <90 mL/min signify possible Chronic Kidney     Disease.   Anion gap 11  5 - 15  CBC WITH DIFFERENTIAL     Status: Abnormal   Collection Time    08/19/2014  1:45 PM      Result Value Ref Range   WBC 27.9 (*) 4.0 - 10.5 K/uL   RBC 3.71 (*) 3.87 - 5.11 MIL/uL   Hemoglobin 11.5 (*) 12.0 - 15.0 g/dL   HCT 35.0 (*) 36.0 - 46.0 %   MCV 94.3  78.0 - 100.0 fL   MCH 31.0  26.0 - 34.0 pg   MCHC 32.9  30.0 - 36.0 g/dL   RDW 14.2  11.5 - 15.5 %   Platelets 201  150 - 400 K/uL   Neutrophils Relative % 85 (*) 43 - 77 %   Lymphocytes Relative 6 (*) 12 - 46 %   Monocytes Relative 9  3 - 12 %   Eosinophils Relative 0  0 - 5 %   Basophils Relative 0  0 - 1 %   Neutro Abs 23.7 (*) 1.7 - 7.7 K/uL   Lymphs Abs 1.7  0.7 - 4.0 K/uL   Monocytes Absolute 2.5 (*) 0.1 - 1.0 K/uL   Eosinophils Absolute 0.0  0.0 - 0.7 K/uL   Basophils Absolute 0.0  0.0 - 0.1 K/uL   WBC Morphology WHITE COUNT CONFIRMED ON SMEAR     Comment: INCREASED BANDS (>20% BANDS)     ATYPICAL LYMPHOCYTES   Smear  Review LARGE PLATELETS PRESENT    LIPASE, BLOOD     Status: None   Collection Time    08/16/2014  1:45 PM      Result Value Ref Range   Lipase 15  11 - 59 U/L  TROPONIN I     Status: None   Collection Time    08/27/2014  1:45 PM      Result Value Ref Range   Troponin I <0.30  <0.30 ng/mL   Comment:            Due to the release kinetics of cTnI,     a negative result within the first hours     of the onset of symptoms does not rule out     myocardial infarction with certainty.     If myocardial infarction is still suspected,     repeat the test at appropriate intervals.  LACTIC ACID, PLASMA     Status: None   Collection Time    09/09/2014  5:45 PM      Result Value Ref Range   Lactic Acid, Venous 0.5  0.5 - 2.2 mmol/L  CORTISOL     Status: None   Collection Time    08/19/2014  5:45 PM      Result Value Ref Range   Cortisol, Plasma 39.9     Comment: (NOTE)     AM:  4.3 - 22.4 ug/dL     PM:  3.1 - 16.7 ug/dL     Performed at [facility]     Status: Abnormal   Collection Time    09/06/2014  5:45 PM      Result Value Ref Range   Prothrombin Time 16.4 (*) 11.6 - 15.2 seconds   INR 1.32  0.00 - 1.49  APTT     Status: Abnormal  Collection Time    08/27/2014  5:45 PM      Result Value Ref Range   aPTT 41 (*) 24 - 37 seconds   Comment:            IF BASELINE aPTT IS ELEVATED,     SUGGEST PATIENT RISK ASSESSMENT     BE USED TO DETERMINE APPROPRIATE     ANTICOAGULANT THERAPY.  FIBRINOGEN     Status: None   Collection Time    09/01/2014  5:45 PM      Result Value Ref Range   Fibrinogen 428  204 - 475 mg/dL  MRSA PCR SCREENING     Status: Abnormal   Collection Time    09/02/2014  6:15 PM      Result Value Ref Range   MRSA by PCR POSITIVE (*) NEGATIVE   Comment:            The GeneXpert MRSA Assay (FDA     approved for NASAL specimens     only), is one component of a     comprehensive MRSA colonization     surveillance program. It is not     intended to  diagnose MRSA     infection nor to guide or     monitor treatment for     MRSA infections.     RESULT CALLED TO, READ BACK BY AND VERIFIED WITH:     KEITH,A AT 2155 BY HUFFINES,S ON 09/13/2014  LACTIC ACID, PLASMA     Status: None   Collection Time    08/23/2014  7:58 PM      Result Value Ref Range   Lactic Acid, Venous 0.6  0.5 - 2.2 mmol/L  COMPREHENSIVE METABOLIC PANEL     Status: Abnormal   Collection Time    08/21/14  5:11 AM      Result Value Ref Range   Sodium 146  137 - 147 mEq/L   Potassium 3.6 (*) 3.7 - 5.3 mEq/L   Chloride 112  96 - 112 mEq/L   CO2 25  19 - 32 mEq/L   Glucose, Bld 96  70 - 99 mg/dL   BUN 31 (*) 6 - 23 mg/dL   Creatinine, Ser 0.89  0.50 - 1.10 mg/dL   Calcium 7.6 (*) 8.4 - 10.5 mg/dL   Total Protein 5.5 (*) 6.0 - 8.3 g/dL   Albumin 2.2 (*) 3.5 - 5.2 g/dL   AST 12  0 - 37 U/L   ALT 8  0 - 35 U/L   Alkaline Phosphatase 75  39 - 117 U/L   Total Bilirubin 0.2 (*) 0.3 - 1.2 mg/dL   GFR calc non Af Amer 54 (*) >90 mL/min   GFR calc Af Amer 62 (*) >90 mL/min   Comment: (NOTE)     The eGFR has been calculated using the CKD EPI equation.     This calculation has not been validated in all clinical situations.     eGFR's persistently <90 mL/min signify possible Chronic Kidney     Disease.   Anion gap 9  5 - 15  CBC     Status: Abnormal   Collection Time    08/21/14  5:11 AM      Result Value Ref Range   WBC 15.2 (*) 4.0 - 10.5 K/uL   RBC 2.92 (*) 3.87 - 5.11 MIL/uL   Hemoglobin 9.2 (*) 12.0 - 15.0 g/dL   Comment: DELTA CHECK NOTED   HCT 28.3 (*) 36.0 -  46.0 %   MCV 96.9  78.0 - 100.0 fL   MCH 31.5  26.0 - 34.0 pg   MCHC 32.5  30.0 - 36.0 g/dL   RDW 14.6  11.5 - 15.5 %   Platelets 152  150 - 400 K/uL   Comment: DELTA CHECK NOTED    ABGS No results found for this basename: PHART, PCO2, PO2ART, TCO2, HCO3,  in the last 72 hours CULTURES Recent Results (from the past 240 hour(s))  CLOSTRIDIUM DIFFICILE BY PCR     Status: None   Collection Time     08/21/2014  9:55 AM      Result Value Ref Range Status   C difficile by pcr NEGATIVE  NEGATIVE Final  MRSA PCR SCREENING     Status: Abnormal   Collection Time    08/21/2014  6:15 PM      Result Value Ref Range Status   MRSA by PCR POSITIVE (*) NEGATIVE Final   Comment:            The GeneXpert MRSA Assay (FDA     approved for NASAL specimens     only), is one component of a     comprehensive MRSA colonization     surveillance program. It is not     intended to diagnose MRSA     infection nor to guide or     monitor treatment for     MRSA infections.     RESULT CALLED TO, READ BACK BY AND VERIFIED WITH:     KEITH,A AT 2155 BY HUFFINES,S ON 08/14/2014   Studies/Results: Dg Chest 1 View  08/21/2014   CLINICAL DATA:  Fever, dementia  EXAM: CHEST - 1 VIEW  COMPARISON:  Portable chest x-ray of 08/17/2014  FINDINGS: Opacity remains in the right upper lobe to the apex which appears to have been present previously and therefore may represent scarring from prior infection. No focal infiltrate or effusion is currently seen. Heart size is stable. Surgical clips are noted in the right upper quadrant from prior cholecystectomy. There are degenerative changes noted in both shoulders.  IMPRESSION: Stable probable scarring in the right upper lobe to the apex. No significant interval change.   Electronically Signed   By: Ivar Drape M.D.   On: 08/31/2014 08:51   Dg Chest Port 1 View  09/06/2014   CLINICAL DATA:  Right PICC placement today.  EXAM: PORTABLE CHEST - 1 VIEW  COMPARISON:  Single view of the chest 08/29/2014 at 1309 hr  FINDINGS: Right PICC is in place with the tip projecting in the lower superior vena cava given patient positioning. Coarsening of the pulmonary interstitium is again seen, unchanged. Mild left basilar atelectasis is noted. Heart size is normal. Severe degenerative disease is seen about the shoulders.  IMPRESSION: Tip of right PICC projects in the lower superior vena cava. No new  abnormality.   Electronically Signed   By: Inge Rise M.D.   On: 08/24/2014 20:00   Dg Chest Port 1 View  09/05/2014   CLINICAL DATA:  Fever.  Abnormal white cell count.  EXAM: PORTABLE CHEST - 1 VIEW  COMPARISON:  08/19/2014  FINDINGS: Normal heart size. There are decreased lung volumes. Coarsened interstitial markings noted bilaterally. There is mild interstitial edema. A small amount of fluid is identified along the heart zonal fissure of the right lung. Biapical scarring is again identified, right greater than left. 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  IMPRESSION: 1. No pneumonia.  2. Mild edema.   Electronically Signed   By: Kerby Moors M.D.   On: 08/29/2014 10:24    Medications: I have reviewed the patient's current medications.  Assesment: Active Problems:   Leukocytosis   Sepsis Demential Anxiety/depression Paroxysmal Afib   Plan: Medications reviewed Will continue combination IV antibiotics Will do swallowing evaluation Will monitor CBC/BMP    LOS: 1 day   [name] 08/21/2014, 8:41 AM

## 2014-08-21 NOTE — Consult Note (Addendum)
CARDIOLOGY CONSULT NOTE  Patient ID: [name] MRN: [id number] DOB/AGE: 01-06-1917 80 y.o.  Admit date: 08/18/2014 Primary Physician Rosita Fire, MD  Reason for Consultation: A fib with RVR  HPI: 80 yr old woman with h/o CAD and multiple prior interventions, mild aortic stenosis, HTN, hyperlipidemia, paroxysmal atrial fibrillation, and dementia admitted with urosepsis with concomitant hypotension. Rhythm initially sinus/sinus tachycardia but has had bursts of rapid atrial fibrillation and SVT. SBP hovering in 80 mmHg range. Pt is demented and disoriented due to sepsis; thus, history is obtained from the chart.  Currently on IV levofloxacin and received ceftriaxone and aztreonam with leukocytosis which is improving. She was most recently evaluated in our clinic in 12/2012 and K. Lawrence NP's note mentions that she is not a candidate for anticoagulation, and had reportedly been maintained on ASA and Plavix. Noted to have severe dementia by admitting physician's note, with question of polypharmacy contributing. Last echo I find is dated 08/19/2010 with normal LV systolic function, EF 55-73%, with mild aortic regurgitation. Admission ECG demonstrated sinus tachycardia, RBBB and LAFB, with probable LVH.    Allergies  Allergen Reactions  . Codeine Other (See Comments)    unknown  . Penicillins Other (See Comments)    unknown  . Sulfa Antibiotics Other (See Comments)    unknown    Current Facility-Administered Medications  Medication Dose Route Frequency Provider Last Rate Last Dose  . 0.9 %  sodium chloride infusion   Intravenous Continuous Waldemar Dickens, MD 100 mL/hr at 08/21/14 1003    . 0.9 %  sodium chloride infusion   Intravenous Continuous Herminio Commons, MD      . antiseptic oral rinse (CPC / CETYLPYRIDINIUM CHLORIDE 0.05%) solution 7 mL  7 mL Mouth Rinse q12n4p Rosita Fire, MD      . aztreonam (AZACTAM) 1 g in dextrose 5 % 50 mL IVPB  1 g Intravenous  Q8H Rosita Fire, MD   1 g at 08/21/14 1144  . chlorhexidine (PERIDEX) 0.12 % solution 15 mL  15 mL Mouth Rinse BID Rosita Fire, MD      . Derrill Memo ON 08/22/2014] Chlorhexidine Gluconate Cloth 2 % PADS 6 each  6 each Topical Q0600 Rosita Fire, MD      . clopidogrel (PLAVIX) tablet 75 mg  75 mg Oral Daily Waldemar Dickens, MD      . digoxin (LANOXIN) 0.25 MG/ML injection 0.25 mg  0.25 mg Intravenous QID PRN Herminio Commons, MD      . divalproex (DEPAKOTE) DR tablet 125 mg  125 mg Oral q morning - 10a Waldemar Dickens, MD   125 mg at 08/23/2014 2000  . feeding supplement (ENSURE) (ENSURE) pudding 1 Container  1 Container Oral TID BM Waldemar Dickens, MD   1 Container at 09/06/2014 2000  . gabapentin (NEURONTIN) capsule 100 mg  100 mg Oral QHS Waldemar Dickens, MD   100 mg at 09/05/2014 2118  . heparin injection 5,000 Units  5,000 Units Subcutaneous 3 times per day Waldemar Dickens, MD   5,000 Units at 08/21/14 0435  . HYDROcodone-acetaminophen (NORCO/VICODIN) 5-325 MG per tablet 1 tablet  1 tablet Oral TID PRN Waldemar Dickens, MD      . Derrill Memo ON 08/22/2014] Influenza vac split quadrivalent PF (FLUARIX) injection 0.5 mL  0.5 mL Intramuscular Tomorrow-1000 Rosita Fire, MD      . Derrill Memo ON 08/22/2014] levofloxacin (LEVAQUIN) IVPB 750 mg  750 mg Intravenous Q48H Tesfaye  Legrand Rams, MD      . metoprolol (LOPRESSOR) injection 5 mg  5 mg Intravenous QID PRN Herminio Commons, MD      . mupirocin ointment (BACTROBAN) 2 % 1 application  1 application Nasal BID Rosita Fire, MD      . ondansetron (ZOFRAN) tablet 4 mg  4 mg Oral Q6H PRN Waldemar Dickens, MD       Or  . ondansetron [facility]) injection 4 mg  4 mg Intravenous Q6H PRN Waldemar Dickens, MD      . Derrill Memo ON 08/22/2014] pneumococcal 23 valent vaccine (PNU-IMMUNE) injection 0.5 mL  0.5 mL Intramuscular Tomorrow-1000 Rosita Fire, MD      . QUEtiapine (SEROQUEL) tablet 25 mg  25 mg Oral QHS Waldemar Dickens, MD   25 mg at 08/31/2014 2118  . sodium chloride 0.9 %  injection 10-40 mL  10-40 mL Intracatheter Q12H Waldemar Dickens, MD   10 mL at 08/21/14 1001  . sodium chloride 0.9 % injection 10-40 mL  10-40 mL Intracatheter PRN Waldemar Dickens, MD   40 mL at 09/11/2014 1234    Past Medical History  Diagnosis Date  . Paroxysmal atrial fibrillation   . Hypertension   . Anxiety   . Arteriosclerotic cardiovascular disease (ASCVD)     Multiple prior PCI, most recently in 2007  . Dementia   . Gastroesophageal reflux disease   . Gout   . Hip fracture, left 2011  . Acute gastroenteritis   . Interstitial pulmonary disease   . Bronchitis, mucopurulent recurrent   . Anxiety   . Depression   . UTI (lower urinary tract infection)   . Carcinoma of breast 2008    Right lumpectomy    Past Surgical History  Procedure Laterality Date  . Orif hip fracture  2011  . Cataract extraction    . Mastectomy  2008    Right  . Hip arthroplasty Right 02/24/2014    Procedure: ARTHROPLASTY BIPOLAR HIP;  Surgeon: Carole Civil, MD;  Location: [facility];  Service: Orthopedics;  Laterality: Right;    History   Social History  . Marital Status: Widowed    Spouse Name: N/A    Number of Children: N/A  . Years of Education: N/A   Occupational History  . Retired    Social History Main Topics  . Smoking status: Never Smoker   . Smokeless tobacco: Never Used  . Alcohol Use: No  . Drug Use: No  . Sexual Activity: Not Currently   Other Topics Concern  . Not on file   Social History Narrative   Resident of [place]     No family history of premature CAD in 1st degree relatives.  Prior to Admission medications   Medication Sig Start Date End Date Taking? Authorizing Provider  Amino Acids-Protein Hydrolys (FEEDING SUPPLEMENT, PRO-STAT SUGAR FREE 64,) LIQD Take 30 mLs by mouth 2 (two) times daily.   Yes Historical Provider, MD  amLODipine-benazepril (LOTREL) 5-20 MG per capsule Take 1 capsule by mouth daily.   Yes Historical Provider, MD  aspirin EC 81  MG tablet Take 81 mg by mouth daily.     Yes Historical Provider, MD  Cholecalciferol (VITAMIN D) 400 UNITS capsule Take 400 Units by mouth daily.   Yes Historical Provider, MD  clopidogrel (PLAVIX) 75 MG tablet Take 75 mg by mouth daily.   Yes Historical Provider, MD  divalproex (DEPAKOTE) 125 MG DR tablet Take 125 mg by mouth every morning.  Yes Historical Provider, MD  gabapentin (NEURONTIN) 100 MG capsule Take 1 capsule (100 mg total) by mouth at bedtime. 06/02/14  Yes Carole Civil, MD  HYDROcodone-acetaminophen (NORCO/VICODIN) 5-325 MG per tablet Take one tablet by mouth three times daily at 6am, 2pm, and 10pm and every 4 hours as needed for pain 08/05/14  Yes Blanchie Serve, MD  metoprolol succinate (TOPROL-XL) 50 MG 24 hr tablet Take 50 mg by mouth every morning. Take with or immediately following a meal.   Yes Historical Provider, MD  Multiple Vitamin (DAILY VITE) TABS Take 1 tablet by mouth daily.   Yes Historical Provider, MD  QUEtiapine (SEROQUEL) 25 MG tablet Take 25 mg by mouth at bedtime.   Yes Historical Provider, MD  ranitidine (ZANTAC) 150 MG tablet Take 150 mg by mouth at bedtime.   Yes Historical Provider, MD  zinc sulfate 220 MG capsule Take 220 mg by mouth daily.   Yes Historical Provider, MD  acetaminophen (TYLENOL) 500 MG tablet Take 500 mg by mouth every 6 (six) hours as needed. For pain     Historical Provider, MD  loperamide (IMODIUM) 2 MG capsule Take 2 mg by mouth daily as needed. For Diarrhea    Historical Provider, MD  nitroGLYCERIN (NITROSTAT) 0.4 MG SL tablet Place 0.4 mg under the tongue every 5 (five) minutes x 3 doses as needed for chest pain.  09/12/11   Thayer Headings, MD  ondansetron (ZOFRAN) 4 MG tablet Take 4 mg by mouth every 6 (six) hours as needed. For nausea/vomiting    Historical Provider, MD  senna (SENOKOT) 8.6 MG TABS Take 1 tablet by mouth daily as needed (constipation). May take 2 extra tablets if needed.    Historical Provider, MD     Review  of systems complete and found to be negative unless listed above in HPI     Physical exam Blood pressure 118/92, pulse 31, temperature 98.4 F (36.9 C), temperature source Axillary, resp. rate 22, height 5\' 4"  (1.626 m), weight 102 lb 15.3 oz (46.7 kg), SpO2 93.00%. General: Demented. Neck: No JVD, no thyromegaly or thyroid nodule.  Lungs: Clear to auscultation bilaterally with normal respiratory effort. CV: Nondisplaced PMI. Tachycardic, normal S1/S2, no S3/S4, no murmur.  No peripheral edema.   Abdomen: Soft.  Skin: Intact without lesions or rashes.  Neurologic: Demented. Extremities: No clubbing or cyanosis.  HEENT: Normal.   ECG: Most recent ECG reviewed.  Labs:   Lab Results  Component Value Date   WBC 15.2* 08/21/2014   HGB 9.2* 08/21/2014   HCT 28.3* 08/21/2014   MCV 96.9 08/21/2014   PLT 152 08/21/2014    Recent Labs Lab 08/21/14 0511  NA 146  K 3.6*  CL 112  CO2 25  BUN 31*  CREATININE 0.89  CALCIUM 7.6*  PROT 5.5*  BILITOT 0.2*  ALKPHOS 75  ALT 8  AST 12  GLUCOSE 96   Lab Results  Component Value Date   CKTOTAL 43 05/11/2012   CKMB 2.1 05/11/2012   TROPONINI <0.30 08/27/2014    Lab Results  Component Value Date   CHOL  Value: 123        ATP III CLASSIFICATION:  <200     mg/dL   Desirable  200-239  mg/dL   Borderline High  >=240    mg/dL   High        08/20/2010   CHOL  Value: 154        ATP III CLASSIFICATION:  <200  mg/dL   Desirable  200-239  mg/dL   Borderline High  >=240    mg/dL   High        03/10/2010   Lab Results  Component Value Date   HDL 50 08/20/2010   HDL 46 03/10/2010   Lab Results  Component Value Date   LDLCALC  Value: 63        Total Cholesterol/HDL:CHD Risk Coronary Heart Disease Risk Table                     Men   Women  1/2 Average Risk   3.4   3.3  Average Risk       5.0   4.4  2 X Average Risk   9.6   7.1  3 X Average Risk  23.4   11.0        Use the calculated Patient Ratio above and the CHD Risk Table to determine the  patient's CHD Risk.        ATP III CLASSIFICATION (LDL):  <100     mg/dL   Optimal  100-129  mg/dL   Near or Above                    Optimal  130-159  mg/dL   Borderline  160-189  mg/dL   High  >190     mg/dL   Very High 08/20/2010   LDLCALC  Value: 84        Total Cholesterol/HDL:CHD Risk Coronary Heart Disease Risk Table                     Men   Women  1/2 Average Risk   3.4   3.3  Average Risk       5.0   4.4  2 X Average Risk   9.6   7.1  3 X Average Risk  23.4   11.0        Use the calculated Patient Ratio above and the CHD Risk Table to determine the patient's CHD Risk.        ATP III CLASSIFICATION (LDL):  <100     mg/dL   Optimal  100-129  mg/dL   Near or Above                    Optimal  130-159  mg/dL   Borderline  160-189  mg/dL   High  >190     mg/dL   Very High 03/10/2010   Lab Results  Component Value Date   TRIG 51 08/20/2010   TRIG 119 03/10/2010   Lab Results  Component Value Date   CHOLHDL 2.5 08/20/2010   CHOLHDL 3.3 03/10/2010   No results found for this basename: LDLDIRECT         Studies: Dg Chest 1 View  09/13/2014   CLINICAL DATA:  Fever, dementia  EXAM: CHEST - 1 VIEW  COMPARISON:  Portable chest x-ray of 08/17/2014  FINDINGS: Opacity remains in the right upper lobe to the apex which appears to have been present previously and therefore may represent scarring from prior infection. No focal infiltrate or effusion is currently seen. Heart size is stable. Surgical clips are noted in the right upper quadrant from prior cholecystectomy. There are degenerative changes noted in both shoulders.  IMPRESSION: Stable probable scarring in the right upper lobe to the apex. No significant interval change.   Electronically Signed   By: Eddie Dibbles  Alvester Chou M.D.   On: 09/12/2014 08:51   Dg Chest Port 1 View  08/31/2014   CLINICAL DATA:  Right PICC placement today.  EXAM: PORTABLE CHEST - 1 VIEW  COMPARISON:  Single view of the chest 09/02/2014 at 1309 hr  FINDINGS: Right PICC is in place with the  tip projecting in the lower superior vena cava given patient positioning. Coarsening of the pulmonary interstitium is again seen, unchanged. Mild left basilar atelectasis is noted. Heart size is normal. Severe degenerative disease is seen about the shoulders.  IMPRESSION: Tip of right PICC projects in the lower superior vena cava. No new abnormality.   Electronically Signed   By: Inge Rise M.D.   On: 08/31/2014 20:00   Dg Chest Port 1 View  09/02/2014   CLINICAL DATA:  Fever.  Abnormal white cell count.  EXAM: PORTABLE CHEST - 1 VIEW  COMPARISON:  08/19/2014  FINDINGS: Normal heart size. There are decreased lung volumes. Coarsened interstitial markings noted bilaterally. There is mild interstitial edema. A small amount of fluid is identified along the heart zonal fissure of the right lung. Biapical scarring is again identified, right greater than left. 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  IMPRESSION: 1. No pneumonia. 2. Mild edema.   Electronically Signed   By: Kerby Moors M.D.   On: 08/29/2014 10:24    ASSESSMENT AND PLAN:  1. Tachycardia (sinus tach, rapid atrial fibrillation, and SVT): Given hypotension, she is not a candidate for diltiazem infusion. I will initiate prn IV digoxin and metoprolol for HR>120 bpm. Not a candidate for anticoagulation. I will stop Plavix as well as it has been several years since PCI. Do not recommend any noninvasive imaging. I feel her HR will become more easy to control as she recovers from urosepsis. I will administer another 1 L of IV normal saline. 2. CAD: History of multiple PCI's in past. Will continue ASA but d/c Plavix as it has been many years since her last intervention. No oral beta blockers given urosepsis+hypotension. 3. Aortic valve disease: No mention of stenosis on last echo, with only mild aortic regurgitation. 4. Urosepsis: On IV Levaquin. Will give another 1L IVNS. 5. Essential HTN: Currently hypotensive with  antihypertensives on hold.   Signed: Kate Sable, M.D., F.A.C.C.  08/21/2014, 2:07 PM

## 2014-08-21 NOTE — Evaluation (Signed)
Clinical/Bedside Swallow Evaluation Patient Details  Name: [name] Manas MRN: [id number] Date of Birth: 02/06/1917  Today's Date: 08/21/2014 Time: 1030-1100 SLP Time Calculation (min): 30 min  Past Medical History:  Past Medical History  Diagnosis Date  . Carcinoma of breast 2008    Right mastectomy  . Paroxysmal atrial fibrillation   . Hypertension   . Anxiety   . Arteriosclerotic cardiovascular disease (ASCVD)     Multiple prior PCI, most recently in 2007  . Dementia   . Gastroesophageal reflux disease   . Gout   . Hip fracture, left 2011  . Acute gastroenteritis   . Interstitial pulmonary disease   . Bronchitis, mucopurulent recurrent   . Anxiety   . Depression   . UTI (lower urinary tract infection)    Past Surgical History:  Past Surgical History  Procedure Laterality Date  . Orif hip fracture  2011  . Cataract extraction    . Mastectomy  2008    Right  . Hip arthroplasty Right 02/24/2014    Procedure: ARTHROPLASTY BIPOLAR HIP;  Surgeon: Carole Civil, MD;  Location: [facility];  Service: Orthopedics;  Laterality: Right;   HPI:  [name] is a 80 yo woman who was admitted from [facility] with fever and leukocytosis; suspect urosepsis. Pt has dementia, but is conversant at baseline. SLP asked to complete bedside swallow evaluation due to risk of aspiration. Chest x-ray showed no pneumonia.   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 She does need cues to avoid talking while eating/drinking as she gets side tracked while eating and starts talking. Pt filled any brief moments of silence with conversation. Pt with mildly prolonged oral phase with semi solids and benefitted from liquid wash. Recommend D3/mech soft with thin liquids with 100% supervision/feeder assist for meals. Pt is judged to be at mild risk for aspiration at this  time due to dementia/decreased sustained attention. SLP will follow up for diet tolerance.    Aspiration Risk  Mild    Diet Recommendation Dysphagia 3 (Mechanical Soft);Thin liquid   Liquid Administration via: Cup;Straw Medication Administration: Whole meds with puree Supervision: Staff to assist with self feeding;Full supervision/cueing for compensatory strategies Compensations: Slow rate;Small sips/bites;Follow solids with liquid Postural Changes and/or Swallow Maneuvers: Seated upright 90 degrees;Upright 30-60 min after meal    Other  Recommendations Oral Care Recommendations: Oral care BID;Staff/trained caregiver to provide oral care   Follow Up Recommendations  Skilled Nursing facility (back to [facility])    Frequency and Duration min 2x/week  2 weeks   Pertinent Vitals/Pain Nasal canula for O2    SLP Swallow Goals   Pt will demonstrate safe and efficient consumption of least restrictive diet with use of strategies as needed.  Swallow Study Prior Functional Status   Residing at [place] Date of Onset: 09/11/2014 HPI: [name] is a 80 yo woman who was admitted from [facility] with fever and leukocytosis; suspect urosepsis. Pt has dementia, but is conversant at baseline. SLP asked to complete bedside swallow evaluation due to risk of aspiration. Chest x-ray showed no pneumonia. Type of Study: Bedside swallow evaluation Previous Swallow Assessment: None on record Diet Prior to this Study: NPO (baseline diet unknown) Temperature Spikes Noted: Yes Respiratory Status: Nasal cannula History of Recent Intubation: No Behavior/Cognition: Alert;Cooperative;Pleasant mood;Confused Oral Cavity - Dentition: Adequate natural dentition Self-Feeding Abilities: Needs assist  Patient Positioning: Upright in bed Baseline Vocal Quality: Clear;Hoarse (glottal fry (likely due to prolonged talking)) Volitional Cough: Cognitively unable to elicit Volitional Swallow: Able to elicit     Oral/Motor/Sensory Function Overall Oral Motor/Sensory Function: Appears within functional limits for tasks assessed   Ice Chips Ice chips: Within functional limits Presentation: Spoon   Thin Liquid Thin Liquid: Within functional limits Presentation: Cup;Straw Other Comments:  (pt cued to avoid talking while drinking/eating)    Nectar Thick Nectar Thick Liquid: Not tested   Honey Thick Honey Thick Liquid: Not tested   Puree Puree: Within functional limits Presentation: Spoon   Solid       Solid: Impaired Presentation: Spoon Oral Phase Impairments: Reduced lingual movement/coordination Oral Phase Functional Implications: Oral residue Other Comments: benefits from liquid wash      Thank you,  Genene Churn, [place]  Riggins Cisek 08/21/2014,11:30 AM

## 2014-08-21 NOTE — Progress Notes (Signed)
INITIAL NUTRITION ASSESSMENT  DOCUMENTATION CODES Per approved criteria  -Non-severe (moderate) malnutrition in the context of chronic illness   Pt meets criteria for moderate MALNUTRITION in the context of chronic illness as evidenced by 10% wt loss in 6 months and mild muscle wasting to clavicles, acromion and quadricept regions .  INTERVENTION: Ensure Complete po TID, each supplement provides 350 kcal and 13 grams of protein   NUTRITION DIAGNOSIS: Inadequate oral intake related to sepsis  as evidenced by poor oral intake intake prior to admission and 10% wt loss in 6 months  Goal: Pt to meet >/= 90% of their estimated nutrition needs     Monitor: Po intake, labs and wt trends    Reason for Assessment: evaluate nutrition status and requirements  80 y.o. female   ASSESSMENT: Pt is a 80 yo female from [place]. She has hx of dementia, anxiety/depression, paroxysmal Afib and breast cancer. Presents with abnormal white cell count and mild interstitial edema. Her appetite and po intake have been decreased recently. Her usual diet is Mechanical Soft with thin liquids. Unable to feed herself. She has been receiving nutritional supplements to increase daily nutrition intake. Pt has experienced weight loss of 2% in 30 days, 5% in 90 days and 12#,10.6% over past 180 days. She has mild wasting to clavicles, acromion, quadricep regions and mild-moderate subcutaneous fat mass depletion to thoracic and upper arms.   Height: Ht Readings from Last 1 Encounters:  08/19/2014 5\' 4"  (1.626 m)    Weight: Wt Readings from Last 1 Encounters:  08/21/14 102 lb 15.3 oz (46.7 kg)    Ideal Body Weight: 120#   % Ideal Body Weight: 86%  Wt Readings from Last 10 Encounters:  08/21/14 102 lb 15.3 oz (46.7 kg)  06/25/14 105 lb 6.4 oz (47.809 kg)  06/02/14 109 lb (49.442 kg)  03/24/14 109 lb (49.442 kg)  02/20/14 109 lb 12.8 oz (49.805 kg)  02/20/14 109 lb 12.8 oz (49.805 kg)  01/01/13 110  lb (49.896 kg)  06/21/12 104 lb (47.174 kg)  06/08/12 105 lb (47.628 kg)  05/13/12 105 lb 6.1 oz (47.8 kg)    Usual Body Weight: 105-109#  % Usual Body Weight: 94%  BMI:  Body mass index is 17.66 kg/(m^2). underweight  Estimated Nutritional Needs: Kcal: 1400-1600  (prevent further weight loss) Protein: 65-75 gr Fluid:  1400 ml daily  Skin: intact  Diet Order: Dysphagia 3 thin liquids  EDUCATION NEEDS: -No education needs identified at this time   Intake/Output Summary (Last 24 hours) at 08/21/14 1533 Last data filed at 08/21/14 0100  Gross per 24 hour  Intake   1110 ml  Output      0 ml  Net   1110 ml    Last BM: 10/7  Labs:   Recent Labs Lab 08/17/14 1912 09/03/2014 1345 08/21/14 0511  NA 141 144 146  K 4.5 3.7 3.6*  CL 103 106 112  CO2 25 27 25   BUN 30* 37* 31*  CREATININE 0.94 1.08 0.89  CALCIUM 9.6 8.7 7.6*  GLUCOSE 90 116* 96    CBG (last 3)  No results found for this basename: GLUCAP,  in the last 72 hours  Scheduled Meds: . antiseptic oral rinse  7 mL Mouth Rinse q12n4p  . [START ON 08/22/2014] aspirin  81 mg Oral Once  . aztreonam  1 g Intravenous Q8H  . chlorhexidine  15 mL Mouth Rinse BID  . [START ON 08/22/2014] Chlorhexidine Gluconate Cloth  6 each Topical Q0600  . divalproex  125 mg Oral q morning - 10a  . feeding supplement (ENSURE)  1 Container Oral TID BM  . gabapentin  100 mg Oral QHS  . heparin  5,000 Units Subcutaneous 3 times per day  . [START ON 08/22/2014] Influenza vac split quadrivalent PF  0.5 mL Intramuscular Tomorrow-1000  . [START ON 08/22/2014] levofloxacin (LEVAQUIN) IV  750 mg Intravenous Q48H  . mupirocin ointment  1 application Nasal BID  . [START ON 08/22/2014] pneumococcal 23 valent vaccine  0.5 mL Intramuscular Tomorrow-1000  . QUEtiapine  25 mg Oral QHS  . sodium chloride  10-40 mL Intracatheter Q12H    Continuous Infusions: . sodium chloride 100 mL/hr at 08/21/14 1003  . sodium chloride 150 mL/hr at 08/21/14  1415    Past Medical History  Diagnosis Date  . Paroxysmal atrial fibrillation   . Hypertension   . Anxiety   . Arteriosclerotic cardiovascular disease (ASCVD)     Multiple prior PCI, most recently in 2007  . Dementia   . Gastroesophageal reflux disease   . Gout   . Hip fracture, left 2011  . Acute gastroenteritis   . Interstitial pulmonary disease   . Bronchitis, mucopurulent recurrent   . Anxiety   . Depression   . UTI (lower urinary tract infection)   . Carcinoma of breast 2008    Right lumpectomy    Past Surgical History  Procedure Laterality Date  . Orif hip fracture  2011  . Cataract extraction    . Mastectomy  2008    Right  . Hip arthroplasty Right 02/24/2014    Procedure: ARTHROPLASTY BIPOLAR HIP;  Surgeon: Carole Civil, MD;  Location: [facility];  Service: Orthopedics;  Laterality: Right;    Colman Cater MS,RD,CSG,LDN Office: [phone] Pager: [phone]

## 2014-08-21 NOTE — Clinical Social Work Psychosocial (Signed)
    Clinical Social Work Department BRIEF PSYCHOSOCIAL ASSESSMENT 08/21/2014  Patient:  [name], [name]     Account Number:  [id number]     Admit date:  09/04/2014  Clinical Social Worker:  Edwyna Shell, [place]  Date/Time:  08/21/2014 10:00 AM  Referred by:  Physician  Date Referred:  08/21/2014 Referred for  SNF Placement   Other Referral:   Interview type:  Family Other interview type:   Also spoke to [facility] Admissions, [place] Living Status:  FACILITY Admitted from facility:  [facility] Level of care:  [place] Primary support name:  [name] Primary support relationship to patient:  CHILD, ADULT Degree of support available:   Supportive ,HCPOA    CURRENT CONCERNS Current Concerns  Post-Acute Placement   Other Concerns:    SOCIAL WORK ASSESSMENT / PLAN CSW unable to assess patient directly, patient oriented to self only.  Currently in ICU, transferred from [facility]. Has been resident of [facility] since 02/2013.  Recieves assist w all ADLS.  Ambulates w wheelchair.  Per facility is pleasant and sometimes talks to herself, does not "make sense" at times.  Facility willing to have patient return at discharge, expressed no concerns.  Son involved, primarily by telephone, lives out of town and does not visit regularly but is supportive of patient.    Spoke w son, [name], by phone. Confirmed discharge plan, says they are "very, very pleased" w care received at [facility].  Want patient to return, glad that [facility] is willing to accept.  Expressed no concerns about SNF.  Says he has been in touch w RN and MD re mother's care at [facility].    CSW confirmed that patient will return to [facility] at discharge, informed son that [facility] will contact him at discharge when patient returns to SNF.   Assessment/plan status:  Psychosocial Support/Ongoing Assessment of Needs Other assessment/ plan:   Information/referral to community resources:    Return to [facility]    PATIENT'S/FAMILY'S RESPONSE TO PLAN OF CARE: Son says they are "very pleased" with care received at SNF, grateful for care provided.        Edwyna Shell, LCSW Clinical Social Worker [phone])

## 2014-08-22 DIAGNOSIS — E440 Moderate protein-calorie malnutrition: Secondary | ICD-10-CM | POA: Insufficient documentation

## 2014-08-22 DIAGNOSIS — A419 Sepsis, unspecified organism: Secondary | ICD-10-CM | POA: Diagnosis not present

## 2014-08-22 LAB — URINE CULTURE: Colony Count: >=100000

## 2014-08-22 LAB — BASIC METABOLIC PANEL
Anion gap: 7 (ref 5–15)
BUN: 18 mg/dL (ref 6–23)
CALCIUM: 7.5 mg/dL — AB (ref 8.4–10.5)
CO2: 23 mEq/L (ref 19–32)
Chloride: 116 mEq/L — ABNORMAL HIGH (ref 96–112)
Creatinine, Ser: 0.66 mg/dL (ref 0.50–1.10)
GFR calc Af Amer: 85 mL/min — ABNORMAL LOW (ref >90)
GFR calc non Af Amer: 73 mL/min — ABNORMAL LOW (ref >90)
GLUCOSE: 87 mg/dL (ref 70–99)
Potassium: 3.5 mEq/L — ABNORMAL LOW (ref 3.7–5.3)
SODIUM: 146 meq/L (ref 137–147)

## 2014-08-22 LAB — CBC
HCT: 30.2 % — ABNORMAL LOW (ref 36.0–46.0)
Hemoglobin: 9.7 g/dL — ABNORMAL LOW (ref 12.0–15.0)
MCH: 31.4 pg (ref 26.0–34.0)
MCHC: 32.1 g/dL (ref 30.0–36.0)
MCV: 97.7 fL (ref 78.0–100.0)
PLATELETS: 170 10*3/uL (ref 150–400)
RBC: 3.09 MIL/uL — ABNORMAL LOW (ref 3.87–5.11)
RDW: 14.5 % (ref 11.5–15.5)
WBC: 7.9 10*3/uL (ref 4.0–10.5)

## 2014-08-22 MED ORDER — CETYLPYRIDINIUM CHLORIDE 0.05 % MT LIQD
7.0000 mL | Freq: Two times a day (BID) | OROMUCOSAL | Status: DC
  Administered 2014-08-22 – 2014-08-24 (×6): 7 mL via OROMUCOSAL

## 2014-08-22 NOTE — Progress Notes (Signed)
Subjective: Patient is alert and awake but confused and disoriented. She had tachycardia of 140/minute. Her B/P remained in the 80.s. Patient was seen by cardiology and started on IV digoxin. Her heart rate is improving. No fever or chills.  Objective: Vital signs in last 24 hours: Temp:  [97.6 F (36.4 C)-99.1 F (37.3 C)] 99 F (37.2 C) (10/09 0400) Resp:  [16-19] 18 (10/09 0500) BP: (84-142)/(46-94) 126/69 mmHg (10/09 0500) SpO2:  [86 %-100 %] 100 % (10/09 0500) Weight:  [48 kg (105 lb 13.1 oz)] 48 kg (105 lb 13.1 oz) (10/09 0500) Weight change: 1.4 kg (3 lb 1.4 oz) Last BM Date: 08/24/2014  Intake/Output from previous day: 10/08 0701 - 10/09 0700 In: 3530 [P.O.:480; I.V.:2850; IV Piggyback:200] Out: -   PHYSICAL EXAM General appearance: fatigued and slowed mentation Resp: clear to auscultation bilaterally Cardio: S1, S2 normal GI: soft, non-tender; bowel sounds normal; no masses,  no organomegaly Extremities: extremities normal, atraumatic, no cyanosis or edema  Lab Results:  Results for orders placed during the hospital encounter of 08/27/2014 (from the past 48 hour(s))  CLOSTRIDIUM DIFFICILE BY PCR     Status: None   Collection Time    08/18/2014  9:55 AM      Result Value Ref Range   C difficile by pcr NEGATIVE  NEGATIVE  URINALYSIS, ROUTINE W REFLEX MICROSCOPIC     Status: Abnormal   Collection Time    08/17/2014  9:59 AM      Result Value Ref Range   Color, Urine YELLOW  YELLOW   APPearance CLEAR  CLEAR   Specific Gravity, Urine 1.015  1.005 - 1.030   pH 5.5  5.0 - 8.0   Glucose, UA NEGATIVE  NEGATIVE mg/dL   Hgb urine dipstick NEGATIVE  NEGATIVE   Bilirubin Urine NEGATIVE  NEGATIVE   Ketones, ur TRACE (*) NEGATIVE mg/dL   Protein, ur NEGATIVE  NEGATIVE mg/dL   Urobilinogen, UA 0.2  0.0 - 1.0 mg/dL   Nitrite POSITIVE (*) NEGATIVE   Leukocytes, UA NEGATIVE  NEGATIVE  URINE MICROSCOPIC-ADD ON     Status: Abnormal   Collection Time    08/24/2014  9:59 AM      Result  Value Ref Range   WBC, UA 3-6  <3 WBC/hpf   RBC / HPF 3-6  <3 RBC/hpf   Bacteria, UA MANY (*) RARE  URINE CULTURE     Status: None   Collection Time    09/02/2014  9:59 AM      Result Value Ref Range   Specimen Description URINE, CATHETERIZED     Special Requests IMMUNE:COMPROMISED     Culture  Setup Time       Value: 08/21/2014 00:37     Performed at [facility]       Value: >=100,000 COLONIES/ML     Performed at [facility]   Culture       Value: ESCHERICHIA COLI     Performed at [facility]   Report Status PENDING    COMPREHENSIVE METABOLIC PANEL     Status: Abnormal   Collection Time    08/22/2014  1:45 PM      Result Value Ref Range   Sodium 144  137 - 147 mEq/L   Potassium 3.7  3.7 - 5.3 mEq/L   Chloride 106  96 - 112 mEq/L   CO2 27  19 - 32 mEq/L   Glucose, Bld 116 (*) 70 - 99  mg/dL   BUN 37 (*) 6 - 23 mg/dL   Creatinine, Ser 1.08  0.50 - 1.10 mg/dL   Calcium 8.7  8.4 - 10.5 mg/dL   Total Protein 7.2  6.0 - 8.3 g/dL   Albumin 3.0 (*) 3.5 - 5.2 g/dL   AST 15  0 - 37 U/L   ALT 11  0 - 35 U/L   Alkaline Phosphatase 86  39 - 117 U/L   Total Bilirubin 0.5  0.3 - 1.2 mg/dL   GFR calc non Af Amer 43 (*) >90 mL/min   GFR calc Af Amer 49 (*) >90 mL/min   Comment: (NOTE)     The eGFR has been calculated using the CKD EPI equation.     This calculation has not been validated in all clinical situations.     eGFR's persistently <90 mL/min signify possible Chronic Kidney     Disease.   Anion gap 11  5 - 15  CBC WITH DIFFERENTIAL     Status: Abnormal   Collection Time    09/03/2014  1:45 PM      Result Value Ref Range   WBC 27.9 (*) 4.0 - 10.5 K/uL   RBC 3.71 (*) 3.87 - 5.11 MIL/uL   Hemoglobin 11.5 (*) 12.0 - 15.0 g/dL   HCT 35.0 (*) 36.0 - 46.0 %   MCV 94.3  78.0 - 100.0 fL   MCH 31.0  26.0 - 34.0 pg   MCHC 32.9  30.0 - 36.0 g/dL   RDW 14.2  11.5 - 15.5 %   Platelets 201  150 - 400 K/uL   Neutrophils Relative % 85 (*) 43 - 77 %    Lymphocytes Relative 6 (*) 12 - 46 %   Monocytes Relative 9  3 - 12 %   Eosinophils Relative 0  0 - 5 %   Basophils Relative 0  0 - 1 %   Neutro Abs 23.7 (*) 1.7 - 7.7 K/uL   Lymphs Abs 1.7  0.7 - 4.0 K/uL   Monocytes Absolute 2.5 (*) 0.1 - 1.0 K/uL   Eosinophils Absolute 0.0  0.0 - 0.7 K/uL   Basophils Absolute 0.0  0.0 - 0.1 K/uL   WBC Morphology WHITE COUNT CONFIRMED ON SMEAR     Comment: INCREASED BANDS (>20% BANDS)     ATYPICAL LYMPHOCYTES   Smear Review LARGE PLATELETS PRESENT    LIPASE, BLOOD     Status: None   Collection Time    08/24/2014  1:45 PM      Result Value Ref Range   Lipase 15  11 - 59 U/L  TROPONIN I     Status: None   Collection Time    09/07/2014  1:45 PM      Result Value Ref Range   Troponin I <0.30  <0.30 ng/mL   Comment:            Due to the release kinetics of cTnI,     a negative result within the first hours     of the onset of symptoms does not rule out     myocardial infarction with certainty.     If myocardial infarction is still suspected,     repeat the test at appropriate intervals.  CULTURE, BLOOD (ROUTINE X 2)     Status: None   Collection Time    08/19/2014  2:36 PM      Result Value Ref Range   Specimen Description BLOOD PICC LINE DRAWN BY  RN [facility]     Special Requests       Value: BOTTLES DRAWN AEROBIC AND ANAEROBIC AEB=6CC ANA=8CC  IMMUNE:COMPROMISED   Culture NO GROWTH 1 DAY     Report Status PENDING    CULTURE, BLOOD (ROUTINE X 2)     Status: None   Collection Time    09/08/2014  2:37 PM      Result Value Ref Range   Specimen Description BLOOD PICC LINE DRAWN BY RN [place]     Special Requests       Value: BOTTLES DRAWN AEROBIC AND ANAEROBIC 8CC  IMMUNE:COMPROMISED   Culture NO GROWTH 1 DAY     Report Status PENDING    LACTIC ACID, PLASMA     Status: None   Collection Time    08/16/2014  5:45 PM      Result Value Ref Range   Lactic Acid, Venous 0.5  0.5 - 2.2 mmol/L  CORTISOL     Status: None   Collection Time    08/30/2014  5:45 PM       Result Value Ref Range   Cortisol, Plasma 39.9     Comment: (NOTE)     AM:  4.3 - 22.4 ug/dL     PM:  3.1 - 16.7 ug/dL     Performed at [facility]     Status: Abnormal   Collection Time    08/31/2014  5:45 PM      Result Value Ref Range   Prothrombin Time 16.4 (*) 11.6 - 15.2 seconds   INR 1.32  0.00 - 1.49  APTT     Status: Abnormal   Collection Time    08/17/2014  5:45 PM      Result Value Ref Range   aPTT 41 (*) 24 - 37 seconds   Comment:            IF BASELINE aPTT IS ELEVATED,     SUGGEST PATIENT RISK ASSESSMENT     BE USED TO DETERMINE APPROPRIATE     ANTICOAGULANT THERAPY.  FIBRINOGEN     Status: None   Collection Time    09/13/2014  5:45 PM      Result Value Ref Range   Fibrinogen 428  204 - 475 mg/dL  MRSA PCR SCREENING     Status: Abnormal   Collection Time    08/17/2014  6:15 PM      Result Value Ref Range   MRSA by PCR POSITIVE (*) NEGATIVE   Comment:            The GeneXpert MRSA Assay (FDA     approved for NASAL specimens     only), is one component of a     comprehensive MRSA colonization     surveillance program. It is not     intended to diagnose MRSA     infection nor to guide or     monitor treatment for     MRSA infections.     RESULT CALLED TO, READ BACK BY AND VERIFIED WITH:     KEITH,A AT 2155 BY HUFFINES,S ON 08/21/2014  LACTIC ACID, PLASMA     Status: None   Collection Time    09/03/2014  7:58 PM      Result Value Ref Range   Lactic Acid, Venous 0.6  0.5 - 2.2 mmol/L  COMPREHENSIVE METABOLIC PANEL     Status: Abnormal   Collection Time    08/21/14  5:11 AM  Result Value Ref Range   Sodium 146  137 - 147 mEq/L   Potassium 3.6 (*) 3.7 - 5.3 mEq/L   Chloride 112  96 - 112 mEq/L   CO2 25  19 - 32 mEq/L   Glucose, Bld 96  70 - 99 mg/dL   BUN 31 (*) 6 - 23 mg/dL   Creatinine, Ser 0.89  0.50 - 1.10 mg/dL   Calcium 7.6 (*) 8.4 - 10.5 mg/dL   Total Protein 5.5 (*) 6.0 - 8.3 g/dL   Albumin 2.2 (*) 3.5 - 5.2 g/dL   AST  12  0 - 37 U/L   ALT 8  0 - 35 U/L   Alkaline Phosphatase 75  39 - 117 U/L   Total Bilirubin 0.2 (*) 0.3 - 1.2 mg/dL   GFR calc non Af Amer 54 (*) >90 mL/min   GFR calc Af Amer 62 (*) >90 mL/min   Comment: (NOTE)     The eGFR has been calculated using the CKD EPI equation.     This calculation has not been validated in all clinical situations.     eGFR's persistently <90 mL/min signify possible Chronic Kidney     Disease.   Anion gap 9  5 - 15  CBC     Status: Abnormal   Collection Time    08/21/14  5:11 AM      Result Value Ref Range   WBC 15.2 (*) 4.0 - 10.5 K/uL   RBC 2.92 (*) 3.87 - 5.11 MIL/uL   Hemoglobin 9.2 (*) 12.0 - 15.0 g/dL   Comment: DELTA CHECK NOTED   HCT 28.3 (*) 36.0 - 46.0 %   MCV 96.9  78.0 - 100.0 fL   MCH 31.5  26.0 - 34.0 pg   MCHC 32.5  30.0 - 36.0 g/dL   RDW 14.6  11.5 - 15.5 %   Platelets 152  150 - 400 K/uL   Comment: DELTA CHECK NOTED  CBC     Status: Abnormal   Collection Time    08/22/14  5:34 AM      Result Value Ref Range   WBC 7.9  4.0 - 10.5 K/uL   RBC 3.09 (*) 3.87 - 5.11 MIL/uL   Hemoglobin 9.7 (*) 12.0 - 15.0 g/dL   HCT 30.2 (*) 36.0 - 46.0 %   MCV 97.7  78.0 - 100.0 fL   MCH 31.4  26.0 - 34.0 pg   MCHC 32.1  30.0 - 36.0 g/dL   RDW 14.5  11.5 - 15.5 %   Platelets 170  150 - 400 K/uL  BASIC METABOLIC PANEL     Status: Abnormal   Collection Time    08/22/14  5:34 AM      Result Value Ref Range   Sodium 146  137 - 147 mEq/L   Potassium 3.5 (*) 3.7 - 5.3 mEq/L   Chloride 116 (*) 96 - 112 mEq/L   CO2 23  19 - 32 mEq/L   Glucose, Bld 87  70 - 99 mg/dL   BUN 18  6 - 23 mg/dL   Comment: DELTA CHECK NOTED   Creatinine, Ser 0.66  0.50 - 1.10 mg/dL   Calcium 7.5 (*) 8.4 - 10.5 mg/dL   GFR calc non Af Amer 73 (*) >90 mL/min   GFR calc Af Amer 85 (*) >90 mL/min   Comment: (NOTE)     The eGFR has been calculated using the CKD EPI equation.     This calculation  has not been validated in all clinical situations.     eGFR's persistently <90  mL/min signify possible Chronic Kidney     Disease.   Anion gap 7  5 - 15    ABGS No results found for this basename: PHART, PCO2, PO2ART, TCO2, HCO3,  in the last 72 hours CULTURES Recent Results (from the past 240 hour(s))  CLOSTRIDIUM DIFFICILE BY PCR     Status: None   Collection Time    08/27/2014  9:55 AM      Result Value Ref Range Status   C difficile by pcr NEGATIVE  NEGATIVE Final  URINE CULTURE     Status: None   Collection Time    09/01/2014  9:59 AM      Result Value Ref Range Status   Specimen Description URINE, CATHETERIZED   Final   Special Requests IMMUNE:COMPROMISED   Final   Culture  Setup Time     Final   Value: 08/21/2014 00:37     Performed at [place]     Final   Value: >=100,000 COLONIES/ML     Performed at [facility]   Culture     Final   Value: ESCHERICHIA COLI     Performed at [facility]   Report Status PENDING   Incomplete  CULTURE, BLOOD (ROUTINE X 2)     Status: None   Collection Time    08/15/2014  2:36 PM      Result Value Ref Range Status   Specimen Description BLOOD PICC LINE DRAWN BY RN [employer]   Final   Special Requests     Final   Value: BOTTLES DRAWN AEROBIC AND ANAEROBIC AEB=6CC ANA=8CC  IMMUNE:COMPROMISED   Culture NO GROWTH 1 DAY   Final   Report Status PENDING   Incomplete  CULTURE, BLOOD (ROUTINE X 2)     Status: None   Collection Time    09/03/2014  2:37 PM      Result Value Ref Range Status   Specimen Description BLOOD PICC LINE DRAWN BY RN [employer]   Final   Special Requests     Final   Value: BOTTLES DRAWN AEROBIC AND ANAEROBIC 8CC  IMMUNE:COMPROMISED   Culture NO GROWTH 1 DAY   Final   Report Status PENDING   Incomplete  MRSA PCR SCREENING     Status: Abnormal   Collection Time    08/21/2014  6:15 PM      Result Value Ref Range Status   MRSA by PCR POSITIVE (*) NEGATIVE Final   Comment:            The GeneXpert MRSA Assay (FDA     approved for NASAL specimens     only), is one component  of a     comprehensive MRSA colonization     surveillance program. It is not     intended to diagnose MRSA     infection nor to guide or     monitor treatment for     MRSA infections.     RESULT CALLED TO, READ BACK BY AND VERIFIED WITH:     KEITH,A AT 2155 BY HUFFINES,S ON 08/14/2014   Studies/Results: Dg Chest Port 1 View  08/30/2014   CLINICAL DATA:  Right PICC placement today.  EXAM: PORTABLE CHEST - 1 VIEW  COMPARISON:  Single view of the chest 08/30/2014 at 1309 hr  FINDINGS: Right PICC is in place with the tip projecting in  the lower superior vena cava given patient positioning. Coarsening of the pulmonary interstitium is again seen, unchanged. Mild left basilar atelectasis is noted. Heart size is normal. Severe degenerative disease is seen about the shoulders.  IMPRESSION: Tip of right PICC projects in the lower superior vena cava. No new abnormality.   Electronically Signed   By: Inge Rise M.D.   On: 08/22/2014 20:00   Dg Chest Port 1 View  08/17/2014   CLINICAL DATA:  Fever.  Abnormal white cell count.  EXAM: PORTABLE CHEST - 1 VIEW  COMPARISON:  08/19/2014  FINDINGS: Normal heart size. There are decreased lung volumes. Coarsened interstitial markings noted bilaterally. There is mild interstitial edema. A small amount of fluid is identified along the heart zonal fissure of the right lung. Biapical scarring is again identified, right greater than left. 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  IMPRESSION: 1. No pneumonia. 2. Mild edema.   Electronically Signed   By: Kerby Moors M.D.   On: 09/02/2014 10:24    Medications: I have reviewed the patient's current medications.  Assesment: Active Problems:   Leukocytosis   Sepsis   Malnutrition of moderate degree Demential Anxiety/depression Paroxysmal Afib   Plan: Medications reviewed Will continue combination IV antibiotics Cardiology consult appreciated  Continue as per cardiology  recommendation    LOS: 2 days   Semone Orlov 08/22/2014, 8:11 AM

## 2014-08-22 NOTE — Progress Notes (Signed)
UR chart review completed.  

## 2014-08-22 NOTE — Care Management Note (Addendum)
    Page 1 of 1   08/25/2014     4:13:23 PM CARE MANAGEMENT NOTE 08/25/2014  Patient:  [name], [name]   Account Number:  [id number]  Date Initiated:  08/22/2014  Documentation initiated by:  Theophilus Kinds  Subjective/Objective Assessment:   Pt admitted from [facility] center with sepsis. Pt will return to facility at discharge.     Action/Plan:   CSW is aware and will arrange discharge to facility when medically stable.   Anticipated DC Date:  08/25/2014   Anticipated DC Plan:  SKILLED NURSING FACILITY  In-house referral  Clinical Social Worker      DC Planning Services  CM consult      Choice offered to / List presented to:             Status of service:  Completed, signed off Medicare Important Message given?  YES (If response is "NO", the following Medicare IM given date fields will be blank) Date Medicare IM given:  08/22/2014 Medicare IM given by:  Christinia Gully C Date Additional Medicare IM given:  08/25/2014 Additional Medicare IM given by:  Theophilus Kinds  Discharge Disposition:  [place]  Per UR Regulation:    If discussed at Long Length of Stay Meetings, dates discussed:    Comments:  08/25/14 [place], RN BSN CM Pt discharged back to [place] center today with [facility]. CSW to arrange discharge to facility.  08/22/14 [place], RN BSN CM

## 2014-08-23 LAB — BASIC METABOLIC PANEL
Anion gap: 8 (ref 5–15)
BUN: 12 mg/dL (ref 6–23)
CHLORIDE: 117 meq/L — AB (ref 96–112)
CO2: 21 meq/L (ref 19–32)
Calcium: 7.1 mg/dL — ABNORMAL LOW (ref 8.4–10.5)
Creatinine, Ser: 0.57 mg/dL (ref 0.50–1.10)
GFR calc non Af Amer: 77 mL/min — ABNORMAL LOW (ref >90)
GFR, EST AFRICAN AMERICAN: 89 mL/min — AB (ref >90)
Glucose, Bld: 107 mg/dL — ABNORMAL HIGH (ref 70–99)
Potassium: 3.5 mEq/L — ABNORMAL LOW (ref 3.7–5.3)
Sodium: 146 mEq/L (ref 137–147)

## 2014-08-23 LAB — CBC
HEMATOCRIT: 31 % — AB (ref 36.0–46.0)
Hemoglobin: 9.6 g/dL — ABNORMAL LOW (ref 12.0–15.0)
MCH: 30.9 pg (ref 26.0–34.0)
MCHC: 31 g/dL (ref 30.0–36.0)
MCV: 99.7 fL (ref 78.0–100.0)
Platelets: 183 10*3/uL (ref 150–400)
RBC: 3.11 MIL/uL — ABNORMAL LOW (ref 3.87–5.11)
RDW: 14.1 % (ref 11.5–15.5)
WBC: 6.8 10*3/uL (ref 4.0–10.5)

## 2014-08-23 NOTE — Accreditation Note (Signed)
DAUGHTER IN BRIEFLY TO VISIST W/ PT. DAUGHTER'S HUSBAND HAS JUST BEEN DX W/ LYMPHOMA

## 2014-08-23 NOTE — Plan of Care (Addendum)
Problem: Phase I Progression Outcomes Goal: Tolerating diet Outcome: Progressing PT APPETITE HAS IMPROVED SLIGHTLY TODAY. CONTINUING TO PROVIDE GOOD ORAL CARE TO FACILATATE MOUTH COMFORT WHEN EATING

## 2014-08-23 NOTE — Progress Notes (Signed)
Subjective: Patient is resting. Claims she feels sick. She ate few bites. Her heart rate is around 120. No fever or chills..  Objective: Vital signs in last 24 hours: Temp:  [97.8 F (36.6 C)-99.1 F (37.3 C)] 99.1 F (37.3 C) (10/10 0806) Resp:  [16-26] 16 (10/10 0800) BP: (100-162)/(43-99) 141/60 mmHg (10/10 0800) SpO2:  [88 %-100 %] 98 % (10/10 0800) Weight:  [53 kg (116 lb 13.5 oz)] 53 kg (116 lb 13.5 oz) (10/10 0500) Weight change: 5 kg (11 lb 0.4 oz) Last BM Date: 08/22/14  Intake/Output from previous day: 10/09 0701 - 10/10 0700 In: 5160 [P.O.:1260; I.V.:3600; IV Piggyback:300] Out: -   PHYSICAL EXAM General appearance: fatigued and slowed mentation Resp: clear to auscultation bilaterally Cardio: S1, S2 normal GI: soft, non-tender; bowel sounds normal; no masses,  no organomegaly Extremities: extremities normal, atraumatic, no cyanosis or edema  Lab Results:  Results for orders placed during the hospital encounter of 08/29/2014 (from the past 48 hour(s))  CBC     Status: Abnormal   Collection Time    08/22/14  5:34 AM      Result Value Ref Range   WBC 7.9  4.0 - 10.5 K/uL   RBC 3.09 (*) 3.87 - 5.11 MIL/uL   Hemoglobin 9.7 (*) 12.0 - 15.0 g/dL   HCT 30.2 (*) 36.0 - 46.0 %   MCV 97.7  78.0 - 100.0 fL   MCH 31.4  26.0 - 34.0 pg   MCHC 32.1  30.0 - 36.0 g/dL   RDW 14.5  11.5 - 15.5 %   Platelets 170  150 - 400 K/uL  BASIC METABOLIC PANEL     Status: Abnormal   Collection Time    08/22/14  5:34 AM      Result Value Ref Range   Sodium 146  137 - 147 mEq/L   Potassium 3.5 (*) 3.7 - 5.3 mEq/L   Chloride 116 (*) 96 - 112 mEq/L   CO2 23  19 - 32 mEq/L   Glucose, Bld 87  70 - 99 mg/dL   BUN 18  6 - 23 mg/dL   Comment: DELTA CHECK NOTED   Creatinine, Ser 0.66  0.50 - 1.10 mg/dL   Calcium 7.5 (*) 8.4 - 10.5 mg/dL   GFR calc non Af Amer 73 (*) >90 mL/min   GFR calc Af Amer 85 (*) >90 mL/min   Comment: (NOTE)     The eGFR has been calculated using the CKD EPI  equation.     This calculation has not been validated in all clinical situations.     eGFR's persistently <90 mL/min signify possible Chronic Kidney     Disease.   Anion gap 7  5 - 15  CBC     Status: Abnormal   Collection Time    08/23/14  6:16 AM      Result Value Ref Range   WBC 6.8  4.0 - 10.5 K/uL   RBC 3.11 (*) 3.87 - 5.11 MIL/uL   Hemoglobin 9.6 (*) 12.0 - 15.0 g/dL   HCT 31.0 (*) 36.0 - 46.0 %   MCV 99.7  78.0 - 100.0 fL   MCH 30.9  26.0 - 34.0 pg   MCHC 31.0  30.0 - 36.0 g/dL   RDW 14.1  11.5 - 15.5 %   Platelets 183  150 - 400 K/uL  BASIC METABOLIC PANEL     Status: Abnormal   Collection Time    08/23/14  6:16 AM  Result Value Ref Range   Sodium 146  137 - 147 mEq/L   Potassium 3.5 (*) 3.7 - 5.3 mEq/L   Chloride 117 (*) 96 - 112 mEq/L   CO2 21  19 - 32 mEq/L   Glucose, Bld 107 (*) 70 - 99 mg/dL   BUN 12  6 - 23 mg/dL   Creatinine, Ser 0.57  0.50 - 1.10 mg/dL   Calcium 7.1 (*) 8.4 - 10.5 mg/dL   GFR calc non Af Amer 77 (*) >90 mL/min   GFR calc Af Amer 89 (*) >90 mL/min   Comment: (NOTE)     The eGFR has been calculated using the CKD EPI equation.     This calculation has not been validated in all clinical situations.     eGFR's persistently <90 mL/min signify possible Chronic Kidney     Disease.   Anion gap 8  5 - 15    ABGS No results found for this basename: PHART, PCO2, PO2ART, TCO2, HCO3,  in the last 72 hours CULTURES Recent Results (from the past 240 hour(s))  CLOSTRIDIUM DIFFICILE BY PCR     Status: None   Collection Time    08/31/2014  9:55 AM      Result Value Ref Range Status   C difficile by pcr NEGATIVE  NEGATIVE Final  URINE CULTURE     Status: None   Collection Time    08/22/2014  9:59 AM      Result Value Ref Range Status   Specimen Description URINE, CATHETERIZED   Final   Special Requests IMMUNE:COMPROMISED   Final   Culture  Setup Time     Final   Value: 08/21/2014 00:37     Performed at [place]      Final   Value: >=100,000 COLONIES/ML     Performed at [facility]   Culture     Final   Value: ESCHERICHIA COLI     Performed at [facility]   Report Status 08/22/2014 FINAL   Final   Organism ID, Bacteria ESCHERICHIA COLI   Final  CULTURE, BLOOD (ROUTINE X 2)     Status: None   Collection Time    09/10/2014  2:36 PM      Result Value Ref Range Status   Specimen Description BLOOD PICC LINE DRAWN BY RN [employer]   Final   Special Requests     Final   Value: BOTTLES DRAWN AEROBIC AND ANAEROBIC AEB=6CC ANA=8CC  IMMUNE:COMPROMISED   Culture NO GROWTH 3 DAYS   Final   Report Status PENDING   Incomplete  CULTURE, BLOOD (ROUTINE X 2)     Status: None   Collection Time    09/09/2014  2:37 PM      Result Value Ref Range Status   Specimen Description BLOOD PICC LINE DRAWN BY RN [employer]   Final   Special Requests     Final   Value: BOTTLES DRAWN AEROBIC AND ANAEROBIC 8CC  IMMUNE:COMPROMISED   Culture NO GROWTH 3 DAYS   Final   Report Status PENDING   Incomplete  MRSA PCR SCREENING     Status: Abnormal   Collection Time    09/05/2014  6:15 PM      Result Value Ref Range Status   MRSA by PCR POSITIVE (*) NEGATIVE Final   Comment:            The GeneXpert MRSA Assay (FDA     approved  for NASAL specimens     only), is one component of a     comprehensive MRSA colonization     surveillance program. It is not     intended to diagnose MRSA     infection nor to guide or     monitor treatment for     MRSA infections.     RESULT CALLED TO, READ BACK BY AND VERIFIED WITH:     KEITH,A AT 2155 BY HUFFINES,S ON 08/19/2014   Studies/Results: No results found.  Medications: I have reviewed the patient's current medications.  Assesment: Active Problems:   Leukocytosis   Sepsis   Malnutrition of moderate degree Demential Anxiety/depression Paroxysmal Afib   Plan: Medications reviewed Will continue combination IV antibiotics Will monitor CBC/BMP Continue supportive care.   LOS: 3  days   Dorthy Hustead 08/23/2014, 9:35 AM

## 2014-08-23 NOTE — Progress Notes (Signed)
The patient has had two episodes of an increased heart rate greater than 120 at 0120 where it was 134 and 0605 where it was 145-150's.  Both episodes occurred when the patient was resting with her eyes closed. See MAR for medication as needed doses.

## 2014-08-23 NOTE — Progress Notes (Signed)
ANTIBIOTIC CONSULT NOTE  Pharmacy Consult for Aztreonam and Levaquin Indication: urosepsis  Allergies  Allergen Reactions  . Codeine Other (See Comments)    unknown  . Penicillins Other (See Comments)    unknown  . Sulfa Antibiotics Other (See Comments)    unknown    Patient Measurements: Height: 5\' 4"  (162.6 cm) Weight: 116 lb 13.5 oz (53 kg) IBW/kg (Calculated) : 54.7  Vital Signs: Temp: 99.1 F (37.3 C) (10/10 0806) Temp Source: Axillary (10/10 0806) BP: 141/60 mmHg (10/10 0800) Intake/Output from previous day: 10/09 0701 - 10/10 0700 In: 5160 [P.O.:1260; I.V.:3600; IV Piggyback:300] Out: -  Intake/Output from this shift: Total I/O In: 270 [P.O.:120; I.V.:150] Out: -   Labs:  Recent Labs  08/21/14 0511 08/22/14 0534 08/23/14 0616  WBC 15.2* 7.9 6.8  HGB 9.2* 9.7* 9.6*  PLT 152 170 183  CREATININE 0.89 0.66 0.57   Estimated Creatinine Clearance: 36 ml/min (by C-G formula based on Cr of 0.57). No results found for this basename: VANCOTROUGH, Corlis Leak, VANCORANDOM, GENTTROUGH, GENTPEAK, GENTRANDOM, TOBRATROUGH, TOBRAPEAK, TOBRARND, AMIKACINPEAK, AMIKACINTROU, AMIKACIN,  in the last 72 hours   Microbiology: Recent Results (from the past 720 hour(s))  CLOSTRIDIUM DIFFICILE BY PCR     Status: None   Collection Time      9:55 AM      Result Value Ref Range Status   C difficile by pcr NEGATIVE  NEGATIVE Final  URINE CULTURE     Status: None   Collection Time    08/24/2014  9:59 AM      Result Value Ref Range Status   Specimen Description URINE, CATHETERIZED   Final   Special Requests IMMUNE:COMPROMISED   Final   Culture  Setup Time     Final   Value: 08/21/2014 00:37     Performed at [facility]     Final   Value: >=100,000 COLONIES/ML     Performed at [facility]   Culture     Final   Value: ESCHERICHIA COLI     Performed at [facility]   Report Status 08/22/2014 FINAL   Final   Organism ID, Bacteria  ESCHERICHIA COLI   Final  CULTURE, BLOOD (ROUTINE X 2)     Status: None   Collection Time    08/22/2014  2:36 PM      Result Value Ref Range Status   Specimen Description BLOOD PICC LINE DRAWN BY RN [employer]   Final   Special Requests     Final   Value: BOTTLES DRAWN AEROBIC AND ANAEROBIC AEB=6CC ANA=8CC  IMMUNE:COMPROMISED   Culture NO GROWTH 3 DAYS   Final   Report Status PENDING   Incomplete  CULTURE, BLOOD (ROUTINE X 2)     Status: None   Collection Time    08/18/2014  2:37 PM      Result Value Ref Range Status   Specimen Description BLOOD PICC LINE DRAWN BY RN [employer]   Final   Special Requests     Final   Value: BOTTLES DRAWN AEROBIC AND ANAEROBIC 8CC  IMMUNE:COMPROMISED   Culture NO GROWTH 3 DAYS   Final   Report Status PENDING   Incomplete  MRSA PCR SCREENING     Status: Abnormal   Collection Time      6:15 PM      Result Value Ref Range Status   MRSA by PCR POSITIVE (*) NEGATIVE Final   Comment:  The GeneXpert MRSA Assay (FDA     approved for NASAL specimens     only), is one component of a     comprehensive MRSA colonization     surveillance program. It is not     intended to diagnose MRSA     infection nor to guide or     monitor treatment for     MRSA infections.     RESULT CALLED TO, READ BACK BY AND VERIFIED WITH:     KEITH,A AT 2155 BY HUFFINES,S ON 09/12/2014    Medical History: Past Medical History  Diagnosis Date  . Paroxysmal atrial fibrillation   . Hypertension   . Anxiety   . Arteriosclerotic cardiovascular disease (ASCVD)     Multiple prior PCI, most recently in 2007  . Dementia   . Gastroesophageal reflux disease   . Gout   . Hip fracture, left 2011  . Acute gastroenteritis   . Interstitial pulmonary disease   . Bronchitis, mucopurulent recurrent   . Anxiety   . Depression   . UTI (lower urinary tract infection)   . Carcinoma of breast 2008    Right lumpectomy   Anti-infectives   Start     Dose/Rate Route Frequency Ordered Stop    08/22/14 2000  levofloxacin (LEVAQUIN) IVPB 750 mg     750 mg 100 mL/hr over 90 Minutes Intravenous Every 48 hours 09/13/2014 1936     08/21/14 0400  aztreonam (AZACTAM) 1 g in dextrose 5 % 50 mL IVPB     1 g 100 mL/hr over 30 Minutes Intravenous Every 8 hours 09/10/2014 1936     08/31/2014 1715  levofloxacin (LEVAQUIN) IVPB 750 mg     750 mg 100 mL/hr over 90 Minutes Intravenous  Once 09/06/2014 1700 08/29/2014 2114   08/23/2014 1715  aztreonam (AZACTAM) 2 g in dextrose 5 % 50 mL IVPB     2 g 100 mL/hr over 30 Minutes Intravenous  Once 08/27/2014 1700 08/23/2014 2013   08/23/2014 1415  cefTRIAXone (ROCEPHIN) 1 g in dextrose 5 % 50 mL IVPB     1 g 100 mL/hr over 30 Minutes Intravenous  Once 08/21/2014 1414 08/24/2014 1603      Assessment: 80yo female admitted with suspected urosepsis.  Asked to dose Aztreonam and Levaquin.  Urine cx+Ecoli- pan-sensitive.  Blood cx NGTD. Patient is afebrile with normal WBC.   Renal function improved to patient's baseline.   Levaquin 10/7>> Aztreonam 10/7>>  Goal of Therapy:  Eradicate infection.  Plan:  Aztreonam 1gm IV q8hrs Levaquin 750mg  IV q48hrs Monitor labs, progress, and cultures Duration of therapy per MD- Consider d/c Aztreonam given cx data & no indication for duplicate therapy  Verneal Wiers, Lavonia Drafts 08/23/2014,9:49 AM

## 2014-08-24 ENCOUNTER — Inpatient Hospital Stay (HOSPITAL_COMMUNITY): Payer: Medicare Other

## 2014-08-24 LAB — COMPREHENSIVE METABOLIC PANEL
ALK PHOS: 58 U/L (ref 39–117)
ALT: 8 U/L (ref 0–35)
ANION GAP: 9 (ref 5–15)
AST: 19 U/L (ref 0–37)
Albumin: 2.1 g/dL — ABNORMAL LOW (ref 3.5–5.2)
BUN: 19 mg/dL (ref 6–23)
CALCIUM: 8 mg/dL — AB (ref 8.4–10.5)
CO2: 20 meq/L (ref 19–32)
Chloride: 117 mEq/L — ABNORMAL HIGH (ref 96–112)
Creatinine, Ser: 0.95 mg/dL (ref 0.50–1.10)
GFR, EST AFRICAN AMERICAN: 58 mL/min — AB (ref >90)
GFR, EST NON AFRICAN AMERICAN: 50 mL/min — AB (ref >90)
GLUCOSE: 116 mg/dL — AB (ref 70–99)
Potassium: 4.9 mEq/L (ref 3.7–5.3)
SODIUM: 146 meq/L (ref 137–147)
Total Bilirubin: <0.2 mg/dL — ABNORMAL LOW (ref 0.3–1.2)
Total Protein: 5.6 g/dL — ABNORMAL LOW (ref 6.0–8.3)

## 2014-08-24 MED ORDER — FUROSEMIDE 10 MG/ML IJ SOLN
40.0000 mg | Freq: Once | INTRAMUSCULAR | Status: AC
  Administered 2014-08-24: 40 mg via INTRAVENOUS
  Filled 2014-08-24: qty 4

## 2014-08-24 MED ORDER — METOPROLOL SUCCINATE ER 50 MG PO TB24
50.0000 mg | ORAL_TABLET | ORAL | Status: DC
  Administered 2014-08-24: 50 mg via ORAL
  Filled 2014-08-24: qty 1

## 2014-08-24 NOTE — Progress Notes (Signed)
DR [facility] CONTACTED AND ORDERS RECEIVED

## 2014-08-24 NOTE — Progress Notes (Signed)
Subjective: Patient is resting. She remained weak and fragile. Her po intake remained very inadequate. She had episode of tachyarrythmia  Objective: Vital signs in last 24 hours: Temp:  [98.1 F (36.7 C)-99.5 F (37.5 C)] 99.5 F (37.5 C) (10/11 0400) Resp:  [13-33] 26 (10/11 0800) BP: (103-147)/(37-102) 132/49 mmHg (10/11 0800) SpO2:  [85 %-100 %] 98 % (10/11 0700) Weight:  [58.2 kg (128 lb 4.9 oz)] 58.2 kg (128 lb 4.9 oz) (10/11 0500) Weight change: 5.2 kg (11 lb 7.4 oz) Last BM Date: 08/22/14  Intake/Output from previous day: 10/10 0701 - 10/11 0700 In: 4272.5 [P.O.:540; I.V.:3582.5; IV Piggyback:150] Out: -   PHYSICAL EXAM General appearance: fatigued and slowed mentation Resp: clear to auscultation bilaterally Cardio: S1, S2 normal GI: soft, non-tender; bowel sounds normal; no masses,  no organomegaly Extremities: extremities normal, atraumatic, no cyanosis or edema  Lab Results:  Results for orders placed during the hospital encounter of 09/03/2014 (from the past 48 hour(s))  CBC     Status: Abnormal   Collection Time    08/23/14  6:16 AM      Result Value Ref Range   WBC 6.8  4.0 - 10.5 K/uL   RBC 3.11 (*) 3.87 - 5.11 MIL/uL   Hemoglobin 9.6 (*) 12.0 - 15.0 g/dL   HCT 31.0 (*) 36.0 - 46.0 %   MCV 99.7  78.0 - 100.0 fL   MCH 30.9  26.0 - 34.0 pg   MCHC 31.0  30.0 - 36.0 g/dL   RDW 14.1  11.5 - 15.5 %   Platelets 183  150 - 400 K/uL  BASIC METABOLIC PANEL     Status: Abnormal   Collection Time    08/23/14  6:16 AM      Result Value Ref Range   Sodium 146  137 - 147 mEq/L   Potassium 3.5 (*) 3.7 - 5.3 mEq/L   Chloride 117 (*) 96 - 112 mEq/L   CO2 21  19 - 32 mEq/L   Glucose, Bld 107 (*) 70 - 99 mg/dL   BUN 12  6 - 23 mg/dL   Creatinine, Ser 0.57  0.50 - 1.10 mg/dL   Calcium 7.1 (*) 8.4 - 10.5 mg/dL   GFR calc non Af Amer 77 (*) >90 mL/min   GFR calc Af Amer 89 (*) >90 mL/min   Comment: (NOTE)     The eGFR has been calculated using the CKD EPI equation.    This calculation has not been validated in all clinical situations.     eGFR's persistently <90 mL/min signify possible Chronic Kidney     Disease.   Anion gap 8  5 - 15    ABGS No results found for this basename: PHART, PCO2, PO2ART, TCO2, HCO3,  in the last 72 hours CULTURES Recent Results (from the past 240 hour(s))  CLOSTRIDIUM DIFFICILE BY PCR     Status: None   Collection Time    09/12/2014  9:55 AM      Result Value Ref Range Status   C difficile by pcr NEGATIVE  NEGATIVE Final  URINE CULTURE     Status: None   Collection Time    09/10/2014  9:59 AM      Result Value Ref Range Status   Specimen Description URINE, CATHETERIZED   Final   Special Requests IMMUNE:COMPROMISED   Final   Culture  Setup Time     Final   Value: 08/21/2014 00:37     Performed at [facility]   Colony Count     Final   Value: >=100,000 COLONIES/ML     Performed at [facility]     Final   Value: ESCHERICHIA COLI     Performed at [facility]   Report Status 08/22/2014 FINAL   Final   Organism ID, Bacteria ESCHERICHIA COLI   Final  CULTURE, BLOOD (ROUTINE X 2)     Status: None   Collection Time    09/03/2014  2:36 PM      Result Value Ref Range Status   Specimen Description BLOOD PICC LINE DRAWN BY RN [employer]   Final   Special Requests     Final   Value: BOTTLES DRAWN AEROBIC AND ANAEROBIC AEB=6CC ANA=8CC  IMMUNE:COMPROMISED   Culture NO GROWTH 3 DAYS   Final   Report Status PENDING   Incomplete  CULTURE, BLOOD (ROUTINE X 2)     Status: None   Collection Time    09/05/2014  2:37 PM      Result Value Ref Range Status   Specimen Description BLOOD PICC LINE DRAWN BY RN [employer]   Final   Special Requests     Final   Value: BOTTLES DRAWN AEROBIC AND ANAEROBIC 8CC  IMMUNE:COMPROMISED   Culture NO GROWTH 3 DAYS   Final   Report Status PENDING   Incomplete  MRSA PCR SCREENING     Status: Abnormal   Collection Time    09/02/2014  6:15 PM      Result Value Ref Range Status    MRSA by PCR POSITIVE (*) NEGATIVE Final   Comment:            The GeneXpert MRSA Assay (FDA     approved for NASAL specimens     only), is one component of a     comprehensive MRSA colonization     surveillance program. It is not     intended to diagnose MRSA     infection nor to guide or     monitor treatment for     MRSA infections.     RESULT CALLED TO, READ BACK BY AND VERIFIED WITH:     KEITH,A AT 2155 BY HUFFINES,S ON 09/06/2014   Studies/Results: No results found.  Medications: I have reviewed the patient's current medications.  Assesment: Active Problems:   Leukocytosis   Sepsis   Malnutrition of moderate degree Demential Anxiety/depression Paroxysmal Afib   Plan: Medications reviewed Will continue combination IV antibiotics Will monitor CBC/BMP Will restart metoprolol  Will decrease iv fluid.   LOS: 4 days   Alva Broxson 08/24/2014, 9:44 AM

## 2014-08-24 NOTE — Progress Notes (Signed)
Patient had two episodes of increased heart rate greater than 120 (130's-150's) with a respiratory rate of 30's to 40's.  Patient does have a more pronounced jugular vein distention at this time on her right side.  Patient was given as needed medications please see MAR.Marland Kitchen

## 2014-08-24 NOTE — Plan of Care (Signed)
Problem: Phase I Progression Outcomes Goal: Adequate I & O Outcome: Not Progressing Appetite remains poor in am and at lunch,partly d/t to lethargy

## 2014-08-24 NOTE — Progress Notes (Signed)
PT'S O2 SAT 87%. O2 CHANGED TO 100% VIA NRB.  1800 ROUSES SLIGHTLY W/ FIRM PHYSICAL STIMULUS. AND IS ABLE TO GROAN AND MOAN.

## 2014-08-25 LAB — CBC
HEMATOCRIT: 27.6 % — AB (ref 36.0–46.0)
HEMOGLOBIN: 8.7 g/dL — AB (ref 12.0–15.0)
MCH: 31.1 pg (ref 26.0–34.0)
MCHC: 31.5 g/dL (ref 30.0–36.0)
MCV: 98.6 fL (ref 78.0–100.0)
Platelets: 181 10*3/uL (ref 150–400)
RBC: 2.8 MIL/uL — AB (ref 3.87–5.11)
RDW: 14.8 % (ref 11.5–15.5)
WBC: 8.8 10*3/uL (ref 4.0–10.5)

## 2014-08-25 LAB — BASIC METABOLIC PANEL
Anion gap: 6 (ref 5–15)
BUN: 21 mg/dL (ref 6–23)
CO2: 20 meq/L (ref 19–32)
Calcium: 7.7 mg/dL — ABNORMAL LOW (ref 8.4–10.5)
Chloride: 120 mEq/L — ABNORMAL HIGH (ref 96–112)
Creatinine, Ser: 1.08 mg/dL (ref 0.50–1.10)
GFR calc Af Amer: 49 mL/min — ABNORMAL LOW (ref >90)
GFR, EST NON AFRICAN AMERICAN: 43 mL/min — AB (ref >90)
GLUCOSE: 126 mg/dL — AB (ref 70–99)
Potassium: 4.6 mEq/L (ref 3.7–5.3)
SODIUM: 146 meq/L (ref 137–147)

## 2014-08-25 LAB — CULTURE, BLOOD (ROUTINE X 2)
CULTURE: NO GROWTH
Culture: NO GROWTH

## 2014-08-25 MED ORDER — METOPROLOL SUCCINATE ER 50 MG PO TB24: 50.0000 mg | ORAL_TABLET | ORAL | Status: DC

## 2014-08-25 MED ORDER — HYDROMORPHONE HCL 1 MG/ML IJ SOLN
1.0000 mg | INTRAMUSCULAR | Status: DC | PRN
  Administered 2014-08-25: 1 mg via INTRAVENOUS
  Filled 2014-08-25: qty 1

## 2014-08-25 MED ORDER — NALOXONE HCL 0.4 MG/ML IJ SOLN
0.4000 mg | INTRAMUSCULAR | Status: AC
  Administered 2014-08-25: 0.4 mg via INTRAVENOUS

## 2014-08-25 MED ORDER — MORPHINE SULFATE 2 MG/ML IJ SOLN: 1.0000 mg | INTRAMUSCULAR | Status: DC | PRN

## 2014-08-25 MED ORDER — MORPHINE SULFATE (CONCENTRATE) 10 MG /0.5 ML PO SOLN: 10.0000 mg | ORAL | Status: AC | PRN

## 2014-08-25 MED ORDER — NALOXONE HCL 0.4 MG/ML IJ SOLN
INTRAMUSCULAR | Status: AC
  Administered 2014-08-25: 0.4 mg via INTRAVENOUS
  Filled 2014-08-25: qty 1

## 2014-08-25 MED ORDER — MORPHINE SULFATE (CONCENTRATE) 10 MG /0.5 ML PO SOLN
10.0000 mg | ORAL | Status: DC | PRN
  Administered 2014-08-25 (×3): 10 mg via ORAL
  Filled 2014-08-25 (×3): qty 0.5

## 2014-08-25 MED ORDER — MORPHINE SULFATE 2 MG/ML IJ SOLN
INTRAMUSCULAR | Status: AC
  Administered 2014-08-25: 2 mg via INTRAVENOUS
  Filled 2014-08-25: qty 1

## 2014-08-25 MED ORDER — MORPHINE SULFATE (CONCENTRATE) 10 MG /0.5 ML PO SOLN
10.0000 mg | ORAL | Status: DC | PRN
  Administered 2014-08-25: 10 mg via ORAL
  Filled 2014-08-25: qty 0.5

## 2014-08-25 NOTE — Discharge Summary (Signed)
Physician Discharge Summary  Patient ID: [name] MRN: [id number] DOB/AGE: [birth year] 78 y.o. Primary Care Physician:Artha Stavros, MD Admit date: 08/31/2014 Discharge date: 08/25/2014    Discharge Diagnoses:  Active Problems:   Leukocytosis   Sepsis   Malnutrition of moderate degree Afib with RVR hypotension    Medication List    STOP taking these medications       amLODipine-benazepril 5-20 MG per capsule  Commonly known as:  LOTREL     aspirin EC 81 MG tablet     clopidogrel 75 MG tablet  Commonly known as:  PLAVIX     DAILY VITE Tabs     divalproex 125 MG DR tablet  Commonly known as:  DEPAKOTE     gabapentin 100 MG capsule  Commonly known as:  NEURONTIN     HYDROcodone-acetaminophen 5-325 MG per tablet  Commonly known as:  NORCO/VICODIN     loperamide 2 MG capsule  Commonly known as:  IMODIUM     metoprolol succinate 50 MG 24 hr tablet  Commonly known as:  TOPROL-XL     nitroGLYCERIN 0.4 MG SL tablet  Commonly known as:  NITROSTAT     QUEtiapine 25 MG tablet  Commonly known as:  SEROQUEL     ranitidine 150 MG tablet  Commonly known as:  ZANTAC     Vitamin D 400 UNITS capsule     zinc sulfate 220 MG capsule      TAKE these medications       acetaminophen 500 MG tablet  Commonly known as:  TYLENOL  Take 500 mg by mouth every 6 (six) hours as needed. For pain     feeding supplement (PRO-STAT SUGAR FREE 64) Liqd  Take 30 mLs by mouth 2 (two) times daily.     morphine CONCENTRATE 10 mg / 0.5 ml concentrated solution  Take 0.5 mLs (10 mg total) by mouth every 2 (two) hours as needed for severe pain.     ondansetron 4 MG tablet  Commonly known as:  ZOFRAN  Take 4 mg by mouth every 6 (six) hours as needed. For nausea/vomiting     senna 8.6 MG Tabs tablet  Commonly known as:  SENOKOT  Take 1 tablet by mouth daily as needed (constipation). May take 2 extra tablets if needed.        Discharged Condition: deteriorated      Consults: cardiology Significant Diagnostic Studies: Dg Chest 1 View  08/24/2014   CLINICAL DATA:  Possible aspiration, hypoxia  EXAM: CHEST - 1 VIEW  COMPARISON:  08/25/2014  FINDINGS: Cardiomegaly. Right PICC line is unchanged in position. Worsening in aeration. Central vascular congestion and bilateral perihilar interstitial prominence suspicious for pulmonary edema. There is probable bilateral small pleural effusion with bilateral hazy atelectasis or infiltrate. Superimposed aspiration cannot be excluded.  IMPRESSION: Worsening in aeration. Central vascular congestion and bilateral perihilar interstitial prominence suspicious for pulmonary edema. There is probable bilateral small pleural effusion with bilateral hazy atelectasis or infiltrate. Superimposed aspiration cannot be excluded.   Electronically Signed   By: Lahoma Crocker M.D.   On: 08/24/2014 18:55   Dg Chest 1 View     CLINICAL DATA:  Fever, dementia  EXAM: CHEST - 1 VIEW  COMPARISON:  Portable chest x-ray of 08/17/2014  FINDINGS: Opacity remains in the right upper lobe to the apex which appears to have been present previously and therefore may represent scarring from prior infection. No focal infiltrate or effusion is currently seen. Heart size is stable.  Surgical clips are noted in the right upper quadrant from prior cholecystectomy. There are degenerative changes noted in both shoulders.  IMPRESSION: Stable probable scarring in the right upper lobe to the apex. No significant interval change.   Electronically Signed   By: Ivar Drape M.D.   On: 08/24/2014 08:51   Dg Chest 1 View  08/17/2014   CLINICAL DATA:  Altered mental status.  Chest pain.  EXAM: CHEST - 1 VIEW  COMPARISON:  Single view of the chest 02/20/2014 and 01/20/2012.  FINDINGS: Right apical scarring is unchanged. Lungs are otherwise clear. Heart size is normal. No pneumothorax or pleural effusion. Marked degenerative change about the shoulders noted.  IMPRESSION: No  acute finding.  Stable compared to prior exam.   Electronically Signed   By: Inge Rise M.D.   On: 08/17/2014 20:14   Ct Head Wo Contrast  08/17/2014   CLINICAL DATA:  Altered mental status. History of breast cancer. Dementia. Hypertension.  EXAM: CT HEAD WITHOUT CONTRAST  TECHNIQUE: Contiguous axial images were obtained from the base of the skull through the vertex without intravenous contrast.  COMPARISON:  01/10/2014 and 02/20/2014  FINDINGS: Examination demonstrates age related atrophic change and chronic ischemic microvascular disease. The ventricles and cisterns are otherwise unremarkable. There is no mass, mass effect, shift of midline structures or acute hemorrhage. There is an old lacunar infarct over the left caudate nucleus. No evidence of acute infarction. Remaining bones and soft tissues are unremarkable.  IMPRESSION: No acute intracranial findings.  Chronic ischemic microvascular disease and age related atrophic change. 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   Electronically Signed   By: Marin Olp M.D.   On: 08/17/2014 20:30   Dg Chest Port 1 View  09/01/2014   CLINICAL DATA:  Right PICC placement today.  EXAM: PORTABLE CHEST - 1 VIEW  COMPARISON:  Single view of the chest 08/30/2014 at 1309 hr  FINDINGS: Right PICC is in place with the tip projecting in the lower superior vena cava given patient positioning. Coarsening of the pulmonary interstitium is again seen, unchanged. Mild left basilar atelectasis is noted. Heart size is normal. Severe degenerative disease is seen about the shoulders.  IMPRESSION: Tip of right PICC projects in the lower superior vena cava. No new abnormality.   Electronically Signed   By: Inge Rise M.D.   On: 08/30/2014 20:00   Dg Chest Port 1 View  08/28/2014   CLINICAL DATA:  Fever.  Abnormal white cell count.  EXAM: PORTABLE CHEST - 1 VIEW  COMPARISON:  08/19/2014  FINDINGS: Normal heart size. There are decreased lung volumes. Coarsened interstitial  markings noted bilaterally. There is mild interstitial edema. A small amount of fluid is identified along the heart zonal fissure of the right lung. Biapical scarring is again identified, right greater than left. 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  IMPRESSION: 1. No pneumonia. 2. Mild edema.   Electronically Signed   By: Kerby Moors M.D.   On: 09/07/2014 10:24    Lab Results: Basic Metabolic Panel:  Recent Labs  08/24/14 2111 08/25/14 0517  NA 146 146  K 4.9 4.6  CL 117* 120*  CO2 20 20  GLUCOSE 116* 126*  BUN 19 21  CREATININE 0.95 1.08  CALCIUM 8.0* 7.7*   Liver Function Tests:  Recent Labs  08/24/14 2111  AST 19  ALT 8  ALKPHOS 58  BILITOT <0.2*  PROT 5.6*  ALBUMIN 2.1*     CBC:  Recent Labs  08/23/14 0616 08/25/14 0517  WBC 6.8 8.8  HGB 9.6* 8.7*  HCT 31.0* 27.6*  MCV 99.7 98.6  PLT 183 181    Recent Results (from the past 240 hour(s))  CLOSTRIDIUM DIFFICILE BY PCR     Status: None   Collection Time    08/15/2014  9:55 AM      Result Value Ref Range Status   C difficile by pcr NEGATIVE  NEGATIVE Final  URINE CULTURE     Status: None   Collection Time    08/25/2014  9:59 AM      Result Value Ref Range Status   Specimen Description URINE, CATHETERIZED   Final   Special Requests IMMUNE:COMPROMISED   Final   Culture  Setup Time     Final   Value: 08/21/2014 00:37     Performed at [facility]     Final   Value: >=100,000 COLONIES/ML     Performed at [facility]   Culture     Final   Value: ESCHERICHIA COLI     Performed at [facility]   Report Status 08/22/2014 FINAL   Final   Organism ID, Bacteria ESCHERICHIA COLI   Final  CULTURE, BLOOD (ROUTINE X 2)     Status: None   Collection Time    08/31/2014  2:36 PM      Result Value Ref Range Status   Specimen Description BLOOD PICC LINE DRAWN BY RN [employer]   Final   Special Requests     Final   Value: BOTTLES DRAWN AEROBIC AND ANAEROBIC  AEB=6CC ANA=8CC  IMMUNE:COMPROMISED   Culture NO GROWTH 5 DAYS   Final   Report Status 08/25/2014 FINAL   Final  CULTURE, BLOOD (ROUTINE X 2)     Status: None   Collection Time    09/11/2014  2:37 PM      Result Value Ref Range Status   Specimen Description BLOOD PICC LINE DRAWN BY RN [employer]   Final   Special Requests     Final   Value: BOTTLES DRAWN AEROBIC AND ANAEROBIC 8CC  IMMUNE:COMPROMISED   Culture NO GROWTH 5 DAYS   Final   Report Status 08/25/2014 FINAL   Final  MRSA PCR SCREENING     Status: Abnormal   Collection Time    08/31/2014  6:15 PM      Result Value Ref Range Status   MRSA by PCR POSITIVE (*) NEGATIVE Final   Comment:            The GeneXpert MRSA Assay (FDA     approved for NASAL specimens     only), is one component of a     comprehensive MRSA colonization     surveillance program. It is not     intended to diagnose MRSA     infection nor to guide or     monitor treatment for     MRSA infections.     RESULT CALLED TO, READ BACK BY AND VERIFIED WITH:     KEITH,A AT 2155 BY HUFFINES,S ON 08/31/2014     Hospital Course:  Amelia Jo is a 80 years old female who was admitted due sepsis to ICU and was started on combinations of IV antibiotics. Patient also developed hypotension and tachyarrhythemia. She was given bolus fluid cardiology recommended to start on IV digoxin since she was hypotensive. Patient was not also able to to take po feeding. Patient condition to deteriorate. HEr prognosis  and her condition was discussed with her son who requested for hospice and comfort care. Patient will be transferred to [facility] with hospice.  Discharge Exam: Blood pressure 91/44, pulse 31, temperature 97.6 F (36.4 C), temperature source Axillary, resp. rate 17, height 5\' 4"  (1.626 m), weight 59.5 kg (131 lb 2.8 oz), SpO2 99.00%.  Disposition: [facility]      Signed: Finnleigh Marchetti   08/25/2014, 4:06 PM

## 2014-08-25 NOTE — Progress Notes (Signed)
The son Barocio,Tim Lemmie Evens [id number]  C [phone]) would like to be called with any updates on her condition today.

## 2014-08-25 NOTE — Progress Notes (Signed)
Subjective: Patient's condition deteriorated over the night. She was complaining of pain and a 1 mg hydromorphone iv and dropped her heart and B/P. She improved after she was given .61m/ml. Patient remained on nonbreather mask and acutely sick looking   Objective: Vital signs in last 24 hours: Temp:  [98.2 F (36.8 C)-98.7 F (37.1 C)] 98.5 F (36.9 C) (10/12 0400) Resp:  [15-29] 18 (10/12 0730) BP: (84-148)/(35-74) 103/45 mmHg (10/12 0730) SpO2:  [85 %-100 %] 100 % (10/12 0730) FiO2 (%):  [100 %] 100 % (10/12 0603) Weight:  [59.5 kg (131 lb 2.8 oz)] 59.5 kg (131 lb 2.8 oz) (10/12 0500) Weight change: 1.3 kg (2 lb 13.9 oz) Last BM Date: 08/24/14  Intake/Output from previous day: 10/11 0701 - 10/12 0700 In: 1730 [P.O.:180; I.V.:1250; IV Piggyback:300] Out: 276 [Urine:276]  PHYSICAL EXAM General appearance: fatigued, moderate distress and slowed mentation Resp: clear to auscultation bilaterally Cardio: S1, S2 normal GI: soft, non-tender; bowel sounds normal; no masses,  no organomegaly Extremities: edema uppert extremity swelling  Lab Results:  Results for orders placed during the hospital encounter of 09/12/2014 (from the past 48 hour(s))  COMPREHENSIVE METABOLIC PANEL     Status: Abnormal   Collection Time    08/24/14  9:11 PM      Result Value Ref Range   Sodium 146  137 - 147 mEq/L   Potassium 4.9  3.7 - 5.3 mEq/L   Comment: DELTA CHECK NOTED   Chloride 117 (*) 96 - 112 mEq/L   CO2 20  19 - 32 mEq/L   Glucose, Bld 116 (*) 70 - 99 mg/dL   BUN 19  6 - 23 mg/dL   Creatinine, Ser 0.95  0.50 - 1.10 mg/dL   Calcium 8.0 (*) 8.4 - 10.5 mg/dL   Total Protein 5.6 (*) 6.0 - 8.3 g/dL   Albumin 2.1 (*) 3.5 - 5.2 g/dL   AST 19  0 - 37 U/L   ALT 8  0 - 35 U/L   Alkaline Phosphatase 58  39 - 117 U/L   Total Bilirubin <0.2 (*) 0.3 - 1.2 mg/dL   GFR calc non Af Amer 50 (*) >90 mL/min   GFR calc Af Amer 58 (*) >90 mL/min   Comment: (NOTE)     The eGFR has been calculated using the  CKD EPI equation.     This calculation has not been validated in all clinical situations.     eGFR's persistently <90 mL/min signify possible Chronic Kidney     Disease.   Anion gap 9  5 - 15  CBC     Status: Abnormal   Collection Time    08/25/14  5:17 AM      Result Value Ref Range   WBC 8.8  4.0 - 10.5 K/uL   RBC 2.80 (*) 3.87 - 5.11 MIL/uL   Hemoglobin 8.7 (*) 12.0 - 15.0 g/dL   HCT 27.6 (*) 36.0 - 46.0 %   MCV 98.6  78.0 - 100.0 fL   MCH 31.1  26.0 - 34.0 pg   MCHC 31.5  30.0 - 36.0 g/dL   RDW 14.8  11.5 - 15.5 %   Platelets 181  150 - 400 K/uL  BASIC METABOLIC PANEL     Status: Abnormal   Collection Time    08/25/14  5:17 AM      Result Value Ref Range   Sodium 146  137 - 147 mEq/L   Potassium 4.6  3.7 - 5.3 mEq/L  Chloride 120 (*) 96 - 112 mEq/L   CO2 20  19 - 32 mEq/L   Glucose, Bld 126 (*) 70 - 99 mg/dL   BUN 21  6 - 23 mg/dL   Creatinine, Ser 1.08  0.50 - 1.10 mg/dL   Calcium 7.7 (*) 8.4 - 10.5 mg/dL   GFR calc non Af Amer 43 (*) >90 mL/min   GFR calc Af Amer 49 (*) >90 mL/min   Comment: (NOTE)     The eGFR has been calculated using the CKD EPI equation.     This calculation has not been validated in all clinical situations.     eGFR's persistently <90 mL/min signify possible Chronic Kidney     Disease.   Anion gap 6  5 - 15    ABGS No results found for this basename: PHART, PCO2, PO2ART, TCO2, HCO3,  in the last 72 hours CULTURES Recent Results (from the past 240 hour(s))  CLOSTRIDIUM DIFFICILE BY PCR     Status: None   Collection Time    08/15/2014  9:55 AM      Result Value Ref Range Status   C difficile by pcr NEGATIVE  NEGATIVE Final  URINE CULTURE     Status: None   Collection Time    08/22/2014  9:59 AM      Result Value Ref Range Status   Specimen Description URINE, CATHETERIZED   Final   Special Requests IMMUNE:COMPROMISED   Final   Culture  Setup Time     Final   Value: 08/21/2014 00:37     Performed at [facility]      Final   Value: >=100,000 COLONIES/ML     Performed at [facility]   Culture     Final   Value: ESCHERICHIA COLI     Performed at [facility]   Report Status 08/22/2014 FINAL   Final   Organism ID, Bacteria ESCHERICHIA COLI   Final  CULTURE, BLOOD (ROUTINE X 2)     Status: None   Collection Time    08/18/2014  2:36 PM      Result Value Ref Range Status   Specimen Description BLOOD PICC LINE DRAWN BY RN [employer]   Final   Special Requests     Final   Value: BOTTLES DRAWN AEROBIC AND ANAEROBIC AEB=6CC ANA=8CC  IMMUNE:COMPROMISED   Culture NO GROWTH 3 DAYS   Final   Report Status PENDING   Incomplete  CULTURE, BLOOD (ROUTINE X 2)     Status: None   Collection Time    08/16/2014  2:37 PM      Result Value Ref Range Status   Specimen Description BLOOD PICC LINE DRAWN BY RN [employer]   Final   Special Requests     Final   Value: BOTTLES DRAWN AEROBIC AND ANAEROBIC 8CC  IMMUNE:COMPROMISED   Culture NO GROWTH 3 DAYS   Final   Report Status PENDING   Incomplete  MRSA PCR SCREENING     Status: Abnormal   Collection Time    08/16/2014  6:15 PM      Result Value Ref Range Status   MRSA by PCR POSITIVE (*) NEGATIVE Final   Comment:            The GeneXpert MRSA Assay (FDA     approved for NASAL specimens     only), is one component of a     comprehensive MRSA colonization  surveillance program. It is not     intended to diagnose MRSA     infection nor to guide or     monitor treatment for     MRSA infections.     RESULT CALLED TO, READ BACK BY AND VERIFIED WITH:     KEITH,A AT 2155 BY HUFFINES,S ON 08/19/2014   Studies/Results: Dg Chest 1 View  08/24/2014   CLINICAL DATA:  Possible aspiration, hypoxia  EXAM: CHEST - 1 VIEW  COMPARISON:  08/23/2014  FINDINGS: Cardiomegaly. Right PICC line is unchanged in position. Worsening in aeration. Central vascular congestion and bilateral perihilar interstitial prominence suspicious for pulmonary edema. There is probable bilateral small  pleural effusion with bilateral hazy atelectasis or infiltrate. Superimposed aspiration cannot be excluded.  IMPRESSION: Worsening in aeration. Central vascular congestion and bilateral perihilar interstitial prominence suspicious for pulmonary edema. There is probable bilateral small pleural effusion with bilateral hazy atelectasis or infiltrate. Superimposed aspiration cannot be excluded.   Electronically Signed   By: Lahoma Crocker M.D.   On: 08/24/2014 18:55    Medications: I have reviewed the patient's current medications.  Assesment: Active Problems:   Leukocytosis   Sepsis   Malnutrition of moderate degree Demential Anxiety/depression Paroxysmal Afib   Plan: Medications reviewed Will continue combination IV antibiotics I have called her Son Octavia Bruckner Daughter to further discuss on her care. Patient is declining and her prognosis is poor.  Continue supportive care.   LOS: 5 days   Knox Cervi 08/25/2014, 8:10 AM

## 2014-08-25 NOTE — Progress Notes (Signed)
Present with patient's son for support. He was expressive about her health changes and their journey through those changes. Prayed with them.

## 2014-08-25 NOTE — Progress Notes (Signed)
Patient was moaning out in pain and on-call Md called for pain medication order (see MAR).  The patient was unable to tolerated it and her heart rate decreased to 35.  Patient given emergency Narcan 0.4 mg/ml per emergency telephone order from Dr. Legrand Rams.  New condition discussed along with comfort care.  Son Octavia Bruckner called and updated about her condition.

## 2014-08-25 NOTE — Progress Notes (Signed)
Patient's heart rate dropped to 20s when turned at 1630.  MD had ordered to dc patient to [facility] prior to this event.  MD notified and ordered to hold off on d.c for now.  Will reassess in am. Schonewitz, Eulis Canner 08/25/2014

## 2014-08-25 NOTE — Clinical Social Work Note (Signed)
CSW received referral for hospice. Discussed with pt's son further and he requests return to [facility] with hospice and is agreeable to [facility] if [facility] unable to provide care. [facility] aware of 100% non-rebreather and can accept her. Referral sent to hospice. MD aware that [facility] is agreeable to return with hospice.    Benay Pike, [place]

## 2014-08-25 NOTE — Clinical Social Work Note (Addendum)
Hold d/c today per MD. [facility] aware.   Benay Pike, [place]

## 2014-08-26 ENCOUNTER — Other Ambulatory Visit: Payer: Self-pay | Admitting: *Deleted

## 2014-08-26 MED ORDER — MORPHINE SULFATE (CONCENTRATE) 20 MG/ML PO SOLN: ORAL | Status: AC

## 2014-08-26 NOTE — Telephone Encounter (Signed)
[facility] 

## 2014-09-01 ENCOUNTER — Ambulatory Visit: Payer: Medicare Other | Admitting: Orthopedic Surgery

## 2014-09-01 NOTE — Clinical Documentation Improvement (Signed)
Please clarify if you agree pt with advanced osteoarthritis is noted in both glenohumeral joints  per Chest Xray 08/23/2014 or other diagnosis requiring treatment of AZACTAM.    Possible Clinical Conditions?                                   Other Condition___________________                 Cannot Clinically Determine_________   Supporting Information: Risk Factors: AFIB RVR, Sepsis, SVT, moderate protein cal malnutrition  Diagnostics Chest Xray 10*07/15 Advanced osteoarthritis is noted in both glenohumeral joints.    Treatment:  aztreonam (AZACTAM) 1 g in dextrose 5 % 50 mL IVPB  Thank You, Heloise Beecham ,RN Clinical Documentation Specialist:  [facility] [employer]

## 2014-09-14 DEATH — deceased
